# Patient Record
Sex: Female | Born: 1960 | Race: Black or African American | Hispanic: No | Marital: Single | Smoking: Never smoker
Health system: Southern US, Community
[De-identification: ages and names within clinical notes are randomized; demographics above are authoritative.]

## PROBLEM LIST (undated history)

## (undated) DIAGNOSIS — F329 Major depressive disorder, single episode, unspecified: Secondary | ICD-10-CM

## (undated) DIAGNOSIS — R87619 Unspecified abnormal cytological findings in specimens from cervix uteri: Secondary | ICD-10-CM

## (undated) DIAGNOSIS — F32A Depression, unspecified: Secondary | ICD-10-CM

## (undated) DIAGNOSIS — K219 Gastro-esophageal reflux disease without esophagitis: Secondary | ICD-10-CM

## (undated) DIAGNOSIS — IMO0002 Reserved for concepts with insufficient information to code with codable children: Secondary | ICD-10-CM

## (undated) DIAGNOSIS — O3410 Maternal care for benign tumor of corpus uteri, unspecified trimester: Secondary | ICD-10-CM

## (undated) DIAGNOSIS — I639 Cerebral infarction, unspecified: Secondary | ICD-10-CM

## (undated) DIAGNOSIS — D259 Leiomyoma of uterus, unspecified: Secondary | ICD-10-CM

## (undated) DIAGNOSIS — R51 Headache: Secondary | ICD-10-CM

## (undated) DIAGNOSIS — L659 Nonscarring hair loss, unspecified: Secondary | ICD-10-CM

## (undated) DIAGNOSIS — Q2112 Patent foramen ovale: Secondary | ICD-10-CM

## (undated) DIAGNOSIS — G43909 Migraine, unspecified, not intractable, without status migrainosus: Secondary | ICD-10-CM

## (undated) DIAGNOSIS — K589 Irritable bowel syndrome without diarrhea: Secondary | ICD-10-CM

## (undated) DIAGNOSIS — F419 Anxiety disorder, unspecified: Secondary | ICD-10-CM

## (undated) DIAGNOSIS — Q211 Atrial septal defect: Secondary | ICD-10-CM

## (undated) DIAGNOSIS — B029 Zoster without complications: Secondary | ICD-10-CM

## (undated) HISTORY — DX: Gastro-esophageal reflux disease without esophagitis: K21.9

## (undated) HISTORY — PX: TONSILLECTOMY: SUR1361

## (undated) HISTORY — DX: Migraine, unspecified, not intractable, without status migrainosus: G43.909

## (undated) HISTORY — DX: Patent foramen ovale: Q21.12

## (undated) HISTORY — DX: Irritable bowel syndrome, unspecified: K58.9

## (undated) HISTORY — DX: Depression, unspecified: F32.A

## (undated) HISTORY — DX: Unspecified abnormal cytological findings in specimens from cervix uteri: R87.619

## (undated) HISTORY — DX: Anxiety disorder, unspecified: F41.9

## (undated) HISTORY — DX: Maternal care for benign tumor of corpus uteri, unspecified trimester: O34.10

## (undated) HISTORY — DX: Leiomyoma of uterus, unspecified: D25.9

## (undated) HISTORY — DX: Reserved for concepts with insufficient information to code with codable children: IMO0002

## (undated) HISTORY — DX: Headache: R51

## (undated) HISTORY — DX: Cerebral infarction, unspecified: I63.9

## (undated) HISTORY — DX: Nonscarring hair loss, unspecified: L65.9

## (undated) HISTORY — DX: Major depressive disorder, single episode, unspecified: F32.9

## (undated) HISTORY — DX: Atrial septal defect: Q21.1

## (undated) HISTORY — DX: Zoster without complications: B02.9

---

## 1996-08-13 HISTORY — PX: OTHER SURGICAL HISTORY: SHX169

## 2001-02-03 ENCOUNTER — Other Ambulatory Visit: Admission: RE | Admit: 2001-02-03 | Discharge: 2001-02-03 | Payer: Self-pay | Admitting: *Deleted

## 2001-02-11 ENCOUNTER — Encounter: Payer: Self-pay | Admitting: Internal Medicine

## 2001-02-11 ENCOUNTER — Encounter: Admission: RE | Admit: 2001-02-11 | Discharge: 2001-02-11 | Payer: Self-pay | Admitting: Internal Medicine

## 2001-10-11 ENCOUNTER — Emergency Department (HOSPITAL_COMMUNITY): Admission: EM | Admit: 2001-10-11 | Discharge: 2001-10-11 | Payer: Self-pay | Admitting: Emergency Medicine

## 2002-02-17 ENCOUNTER — Other Ambulatory Visit: Admission: RE | Admit: 2002-02-17 | Discharge: 2002-02-17 | Payer: Self-pay | Admitting: Obstetrics and Gynecology

## 2002-07-28 ENCOUNTER — Other Ambulatory Visit: Admission: RE | Admit: 2002-07-28 | Discharge: 2002-07-28 | Payer: Self-pay | Admitting: Obstetrics and Gynecology

## 2003-03-22 ENCOUNTER — Other Ambulatory Visit: Admission: RE | Admit: 2003-03-22 | Discharge: 2003-03-22 | Payer: Self-pay | Admitting: Obstetrics and Gynecology

## 2003-04-01 ENCOUNTER — Encounter: Payer: Self-pay | Admitting: Gastroenterology

## 2003-04-01 ENCOUNTER — Encounter: Admission: RE | Admit: 2003-04-01 | Discharge: 2003-04-01 | Payer: Self-pay | Admitting: Gastroenterology

## 2003-08-19 ENCOUNTER — Encounter: Admission: RE | Admit: 2003-08-19 | Discharge: 2003-08-19 | Payer: Self-pay | Admitting: Gastroenterology

## 2004-04-20 ENCOUNTER — Other Ambulatory Visit: Admission: RE | Admit: 2004-04-20 | Discharge: 2004-04-20 | Payer: Self-pay | Admitting: Obstetrics and Gynecology

## 2004-04-28 ENCOUNTER — Ambulatory Visit (HOSPITAL_COMMUNITY): Admission: RE | Admit: 2004-04-28 | Discharge: 2004-04-28 | Payer: Self-pay | Admitting: Obstetrics and Gynecology

## 2004-05-18 ENCOUNTER — Ambulatory Visit (HOSPITAL_COMMUNITY): Admission: RE | Admit: 2004-05-18 | Discharge: 2004-05-18 | Payer: Self-pay | Admitting: Gastroenterology

## 2004-11-16 ENCOUNTER — Encounter: Admission: RE | Admit: 2004-11-16 | Discharge: 2004-11-16 | Payer: Self-pay | Admitting: *Deleted

## 2005-04-30 ENCOUNTER — Ambulatory Visit (HOSPITAL_COMMUNITY): Admission: RE | Admit: 2005-04-30 | Discharge: 2005-04-30 | Payer: Self-pay | Admitting: Obstetrics and Gynecology

## 2005-04-30 ENCOUNTER — Other Ambulatory Visit: Admission: RE | Admit: 2005-04-30 | Discharge: 2005-04-30 | Payer: Self-pay | Admitting: Obstetrics and Gynecology

## 2005-08-23 ENCOUNTER — Encounter: Admission: RE | Admit: 2005-08-23 | Discharge: 2005-08-23 | Payer: Self-pay | Admitting: Internal Medicine

## 2006-04-12 ENCOUNTER — Encounter (INDEPENDENT_AMBULATORY_CARE_PROVIDER_SITE_OTHER): Payer: Self-pay | Admitting: Cardiology

## 2006-04-12 ENCOUNTER — Ambulatory Visit (HOSPITAL_COMMUNITY): Admission: RE | Admit: 2006-04-12 | Discharge: 2006-04-12 | Payer: Self-pay | Admitting: Cardiology

## 2006-05-03 ENCOUNTER — Other Ambulatory Visit: Admission: RE | Admit: 2006-05-03 | Discharge: 2006-05-03 | Payer: Self-pay | Admitting: Obstetrics & Gynecology

## 2006-05-03 ENCOUNTER — Ambulatory Visit (HOSPITAL_COMMUNITY): Admission: RE | Admit: 2006-05-03 | Discharge: 2006-05-03 | Payer: Self-pay | Admitting: Obstetrics & Gynecology

## 2006-05-22 ENCOUNTER — Encounter: Admission: RE | Admit: 2006-05-22 | Discharge: 2006-05-22 | Payer: Self-pay | Admitting: Obstetrics & Gynecology

## 2006-05-31 ENCOUNTER — Inpatient Hospital Stay (HOSPITAL_COMMUNITY): Admission: EM | Admit: 2006-05-31 | Discharge: 2006-06-04 | Payer: Self-pay | Admitting: Pediatrics

## 2007-05-02 ENCOUNTER — Other Ambulatory Visit: Admission: RE | Admit: 2007-05-02 | Discharge: 2007-05-02 | Payer: Self-pay | Admitting: Obstetrics and Gynecology

## 2007-05-08 ENCOUNTER — Ambulatory Visit (HOSPITAL_COMMUNITY): Admission: RE | Admit: 2007-05-08 | Discharge: 2007-05-08 | Payer: Self-pay | Admitting: Obstetrics & Gynecology

## 2007-11-28 ENCOUNTER — Other Ambulatory Visit: Admission: RE | Admit: 2007-11-28 | Discharge: 2007-11-28 | Payer: Self-pay | Admitting: Obstetrics and Gynecology

## 2008-05-12 ENCOUNTER — Ambulatory Visit (HOSPITAL_COMMUNITY): Admission: RE | Admit: 2008-05-12 | Discharge: 2008-05-12 | Payer: Self-pay | Admitting: Obstetrics & Gynecology

## 2008-05-19 ENCOUNTER — Other Ambulatory Visit: Admission: RE | Admit: 2008-05-19 | Discharge: 2008-05-19 | Payer: Self-pay | Admitting: Obstetrics and Gynecology

## 2009-05-13 HISTORY — PX: OTHER SURGICAL HISTORY: SHX169

## 2009-05-25 ENCOUNTER — Ambulatory Visit (HOSPITAL_COMMUNITY): Admission: RE | Admit: 2009-05-25 | Discharge: 2009-05-25 | Payer: Self-pay | Admitting: Obstetrics & Gynecology

## 2009-06-01 ENCOUNTER — Ambulatory Visit (HOSPITAL_BASED_OUTPATIENT_CLINIC_OR_DEPARTMENT_OTHER): Admission: RE | Admit: 2009-06-01 | Discharge: 2009-06-01 | Payer: Self-pay | Admitting: Orthopedic Surgery

## 2010-06-23 ENCOUNTER — Ambulatory Visit (HOSPITAL_COMMUNITY): Admission: RE | Admit: 2010-06-23 | Discharge: 2010-06-23 | Payer: Self-pay | Admitting: Obstetrics & Gynecology

## 2010-09-02 ENCOUNTER — Encounter: Payer: Self-pay | Admitting: Obstetrics & Gynecology

## 2010-11-16 LAB — POCT HEMOGLOBIN-HEMACUE: Hemoglobin: 11.5 g/dL — ABNORMAL LOW (ref 12.0–15.0)
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
## 2011-04-13 ENCOUNTER — Other Ambulatory Visit: Payer: Self-pay | Admitting: Obstetrics & Gynecology

## 2011-04-13 DIAGNOSIS — Z1231 Encounter for screening mammogram for malignant neoplasm of breast: Secondary | ICD-10-CM

## 2011-06-25 ENCOUNTER — Ambulatory Visit (HOSPITAL_COMMUNITY)
Admission: RE | Admit: 2011-06-25 | Discharge: 2011-06-25 | Disposition: A | Discharge status: Home / Self Care | Payer: BC Managed Care – PPO | Source: Ambulatory Visit | Attending: Obstetrics & Gynecology | Admitting: Obstetrics & Gynecology

## 2011-06-25 ENCOUNTER — Ambulatory Visit (HOSPITAL_COMMUNITY): Payer: Self-pay

## 2011-06-25 DIAGNOSIS — Z1231 Encounter for screening mammogram for malignant neoplasm of breast: Secondary | ICD-10-CM | POA: Insufficient documentation

## 2011-11-11 ENCOUNTER — Ambulatory Visit: Payer: BC Managed Care – PPO | Admitting: Family Medicine

## 2011-11-11 ENCOUNTER — Ambulatory Visit: Payer: BC Managed Care – PPO

## 2011-11-11 VITALS — BP 127/80 | HR 105 | Temp 98.8°F | Resp 16 | Ht 63.25 in | Wt 148.4 lb

## 2011-11-11 DIAGNOSIS — R05 Cough: Secondary | ICD-10-CM

## 2011-11-11 DIAGNOSIS — J019 Acute sinusitis, unspecified: Secondary | ICD-10-CM

## 2011-11-11 DIAGNOSIS — J301 Allergic rhinitis due to pollen: Secondary | ICD-10-CM

## 2011-11-11 MED ORDER — HYDROCODONE-HOMATROPINE 5-1.5 MG/5ML PO SYRP: 5.0000 mL | ORAL_SOLUTION | Freq: Three times a day (TID) | ORAL | Status: AC | PRN

## 2011-11-11 MED ORDER — AMOXICILLIN 500 MG PO CAPS: 500.0000 mg | ORAL_CAPSULE | Freq: Three times a day (TID) | ORAL | Status: AC

## 2011-11-11 NOTE — Progress Notes (Signed)
  Subjective:    Patient ID: Vicki Wilson, female    DOB: 07/27/1961, 51 y.o.   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 This may have started with allergy symptoms. For the last 4 days she has been hoarse, with a sore throat, a nocturnal cough, and significant nasal congestion. She has no fever    Review of Systems     Objective:   Physical Exam  Vital signs are stable TMs clear  Nares purulent dischargeWith bilateral maxillary sinus tenderness to percussion Left conjunctiva is injected there is no eye discharge The throat is clear No a.c. Nodes Lungs clear       Assessment & Plan Problem #1 acute sinusitis :Problem #1 acute sinusitis  Problem #1 acute sinusitis Problem #2 allergic rhinitis Problem #3 cough Problem #4 conjunctivitis  Amoxicillin 1 g twice a day for 10 days Hycodan especially at bedtime Continue Zyrtec and Nasonex Followup if not well in 7-10 days

## 2011-11-15 ENCOUNTER — Encounter: Payer: Self-pay | Admitting: Family Medicine

## 2011-11-15 ENCOUNTER — Telehealth: Payer: Self-pay

## 2011-11-15 NOTE — Telephone Encounter (Signed)
Fine

## 2011-11-15 NOTE — Telephone Encounter (Signed)
Note written and patient notified to pickup.

## 2011-11-15 NOTE — Telephone Encounter (Signed)
Patient called and is still not feeling well. 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  Would like for Dr Merla Riches to extend work note for Wed 4/3 and Thurs 4/4? Best number [phone]

## 2011-11-19 ENCOUNTER — Ambulatory Visit (INDEPENDENT_AMBULATORY_CARE_PROVIDER_SITE_OTHER): Payer: BC Managed Care – PPO | Admitting: Sports Medicine

## 2011-11-19 VITALS — BP 124/82 | Ht 63.0 in | Wt 148.0 lb

## 2011-11-19 DIAGNOSIS — M79673 Pain in unspecified foot: Secondary | ICD-10-CM

## 2011-11-19 DIAGNOSIS — M79609 Pain in unspecified limb: Secondary | ICD-10-CM

## 2011-11-19 NOTE — Progress Notes (Signed)
  Subjective:    Patient ID: Vicki Wilson, female    DOB: Jun 19, 1961, 51 y.o.   MRN: [id number]  HPI 51 y/o female is here c/o left foot pain since January.  She walk/runs 2-3 miles 3-4 times per week on the treadmill but stopped about a month ago due to the pain.  The pain is a burning pain at the great MTP.  She also gets pain along the 1st metatarsal.  No swelling.   No injury.  The pain has improved somewhat since she stopped her treadmill work out.  She wears custom orthotics for flat feet.     Review of Systems     Objective:   Physical Exam  Loss of the longitudinal and transverse arch bilaterally Great toe MTP is prominent, left greater than right There is mild swelling of the left great toe MTP No erythema but it is tender to palpation Tender to palpation at the distal 1st metatarsal The remainder of the foot is non-tender No midfoot collapse Posterior tib function normal  Gait is neutral with sports insoles and scaphoid support  Ultrasound: No evidence of stress fracture.  Small fluid collection along the 1st metatarsal.  Spurring in the great toe MTP.      Assessment & Plan:

## 2011-11-21 DIAGNOSIS — M79673 Pain in unspecified foot: Secondary | ICD-10-CM | POA: Insufficient documentation

## 2011-11-21 NOTE — Assessment & Plan Note (Signed)
Her old orthotics are no longer effective. The memory plastic is cracked.  We have given her a temporary insole with scaphoid support.  She will follow up with [place] for new custom inserts.  While she does have some arthritic changes of the MTP joint the main cause of her pain is likely secondary to lack of support.  She can use OTC topical anti-inflammatories or NSAID's PRN joint pain.

## 2011-12-20 ENCOUNTER — Ambulatory Visit (INDEPENDENT_AMBULATORY_CARE_PROVIDER_SITE_OTHER): Payer: BC Managed Care – PPO | Admitting: Sports Medicine

## 2011-12-20 DIAGNOSIS — M79673 Pain in unspecified foot: Secondary | ICD-10-CM

## 2011-12-20 DIAGNOSIS — M79609 Pain in unspecified limb: Secondary | ICD-10-CM

## 2011-12-20 DIAGNOSIS — R269 Unspecified abnormalities of gait and mobility: Secondary | ICD-10-CM

## 2011-12-20 NOTE — Assessment & Plan Note (Signed)
Patient was fitted for a : standard, cushioned, semi-rigid orthotic. The orthotic was heated and afterward the patient stood on the orthotic blank positioned on the orthotic stand. The patient was positioned in subtalar neutral position and 10 degrees of ankle dorsiflexion in a weight bearing stance. After completion of molding, a stable base was applied to the orthotic blank. The blank was ground to a stable position for weight bearing. Size: 7 red cambray Base: blue EVA Posting: none Additional orthotic padding: none Time 35 mins  Walking gait was neutral in the orthotics. Jogging gait also remained neutral. She felt good comfort with these.  Recheck when necessary

## 2011-12-20 NOTE — Progress Notes (Signed)
Patient ID: Vicki Wilson, female   DOB: 02/04/1961, 51 y.o.   MRN: [id number]  Patient returns in followup of bilateral foot pain.  Left foot head pain primarily over the first MTP joint. On ultrasound there is some fluid in spurring in this joint. She has been orthotics for a long time but her old orthotics were cracked and did not support the forefoot. We put her in temporary sports and cells with scaphoid pad. These relieve some of her pains. She is coming back today for custom orthotics.  Objective Patient is not in any acute distress There is no swelling in either foot Left first MTP shows degenerative changes She has flattening of the longitudinal arches bilaterally with resting pronation Mild calcaneal valgus on the left Some loss of the transverse arch bilaterally a total and

## 2011-12-20 NOTE — Assessment & Plan Note (Signed)
Most of her left first ray and first MTP pain is resolved when walking in the orthotics  After one month if this is not giving her adequate relief we will add a first ray post

## 2012-04-17 ENCOUNTER — Other Ambulatory Visit: Payer: Self-pay | Admitting: Obstetrics & Gynecology

## 2012-04-17 DIAGNOSIS — Z1231 Encounter for screening mammogram for malignant neoplasm of breast: Secondary | ICD-10-CM

## 2012-06-27 ENCOUNTER — Ambulatory Visit (HOSPITAL_COMMUNITY)
Admission: RE | Admit: 2012-06-27 | Discharge: 2012-06-27 | Disposition: A | Discharge status: Home / Self Care | Payer: BC Managed Care – PPO | Source: Ambulatory Visit | Attending: Obstetrics & Gynecology | Admitting: Obstetrics & Gynecology

## 2012-06-27 DIAGNOSIS — Z1231 Encounter for screening mammogram for malignant neoplasm of breast: Secondary | ICD-10-CM | POA: Insufficient documentation

## 2012-06-27 LAB — HM PAP SMEAR: HM Pap smear: NEGATIVE

## 2012-11-06 ENCOUNTER — Telehealth: Payer: Self-pay | Admitting: Nurse Practitioner

## 2012-11-06 NOTE — Telephone Encounter (Signed)
LEFT MESSAGE ON HOME AND CELL # TO RETURN OUR CALL . SUE

## 2012-11-06 NOTE — Telephone Encounter (Signed)
Has Vicki Wilson come off Micronor?  If Vicki Wilson has come off then since no cycle in 90 days - lets do a Provera challenge 10 mg. X 10 days. Then cb with + / - response. PG

## 2012-11-06 NOTE — Telephone Encounter (Signed)
pt reports no cycle for 90 days/please advise/[place]

## 2012-11-06 NOTE — Telephone Encounter (Signed)
LEFT MESSAGE ON CELL # TO RETURN OUR CALL FOR MORE INFO. PLEASE ADVISE. SUE  . CHART IN YOUR OFFICE. SUE

## 2012-11-07 MED ORDER — MEDROXYPROGESTERONE ACETATE 10 MG PO TABS: 10.0000 mg | ORAL_TABLET | Freq: Every day | ORAL | Status: DC

## 2012-11-07 NOTE — Telephone Encounter (Signed)
[facility] notified via phone of provera order. sue

## 2012-11-07 NOTE — Telephone Encounter (Signed)
Patient states she did stop Micronor. Instructions given per Shirlyn Goltz, FNP, of medication to start. Rx sent by [facility] to [facility]. sue

## 2012-11-07 NOTE — Addendum Note (Signed)
Addended by: Elnora Morrison on: 11/07/2012 11:26 AM   Modules accepted: Orders

## 2012-12-05 ENCOUNTER — Other Ambulatory Visit: Payer: Self-pay | Admitting: Gastroenterology

## 2012-12-05 DIAGNOSIS — R109 Unspecified abdominal pain: Secondary | ICD-10-CM

## 2012-12-10 ENCOUNTER — Ambulatory Visit
Admission: RE | Admit: 2012-12-10 | Discharge: 2012-12-10 | Disposition: A | Discharge status: Home / Self Care | Payer: BC Managed Care – PPO | Source: Ambulatory Visit | Attending: Gastroenterology | Admitting: Gastroenterology

## 2012-12-10 DIAGNOSIS — R109 Unspecified abdominal pain: Secondary | ICD-10-CM

## 2012-12-16 ENCOUNTER — Telehealth: Payer: Self-pay | Admitting: Nurse Practitioner

## 2012-12-16 NOTE — Telephone Encounter (Signed)
Have patient to keep a menses record and again if no menses in 3-4 months to call back.

## 2012-12-16 NOTE — Telephone Encounter (Signed)
Pt states she began provera on 12/03/2012 an started her menses on 12/13/2012. Just FYI. Chart on your desk.

## 2012-12-16 NOTE — Telephone Encounter (Signed)
Started provera on 12/03/12 for 10 days.  Began menstrual cycle on 12/13/12

## 2012-12-17 NOTE — Telephone Encounter (Signed)
LVMTCB in regards to question.

## 2012-12-18 ENCOUNTER — Telehealth: Payer: Self-pay | Admitting: *Deleted

## 2012-12-18 NOTE — Telephone Encounter (Signed)
Pt is aware and agreeable to contact office if no menses in 3-4 months, per PG.

## 2013-01-02 ENCOUNTER — Encounter: Payer: Self-pay | Admitting: Neurology

## 2013-01-08 ENCOUNTER — Encounter: Payer: Self-pay | Admitting: Neurology

## 2013-01-08 ENCOUNTER — Other Ambulatory Visit: Payer: Self-pay | Admitting: *Deleted

## 2013-01-08 ENCOUNTER — Ambulatory Visit (INDEPENDENT_AMBULATORY_CARE_PROVIDER_SITE_OTHER): Payer: BC Managed Care – PPO | Admitting: Neurology

## 2013-01-08 ENCOUNTER — Telehealth: Payer: Self-pay | Admitting: Neurology

## 2013-01-08 VITALS — BP 111/79 | HR 66 | Temp 97.0°F | Ht 63.0 in | Wt 156.0 lb

## 2013-01-08 DIAGNOSIS — G43009 Migraine without aura, not intractable, without status migrainosus: Secondary | ICD-10-CM | POA: Insufficient documentation

## 2013-01-08 DIAGNOSIS — I6330 Cerebral infarction due to thrombosis of unspecified cerebral artery: Secondary | ICD-10-CM

## 2013-01-08 DIAGNOSIS — M791 Myalgia, unspecified site: Secondary | ICD-10-CM | POA: Insufficient documentation

## 2013-01-08 DIAGNOSIS — H53149 Visual discomfort, unspecified: Secondary | ICD-10-CM | POA: Insufficient documentation

## 2013-01-08 DIAGNOSIS — E2749 Other adrenocortical insufficiency: Secondary | ICD-10-CM

## 2013-01-08 DIAGNOSIS — R42 Dizziness and giddiness: Secondary | ICD-10-CM | POA: Insufficient documentation

## 2013-01-08 DIAGNOSIS — Q211 Atrial septal defect: Secondary | ICD-10-CM | POA: Insufficient documentation

## 2013-01-08 DIAGNOSIS — R748 Abnormal levels of other serum enzymes: Secondary | ICD-10-CM | POA: Insufficient documentation

## 2013-01-08 MED ORDER — PROPRANOLOL HCL 20 MG PO TABS: 10.0000 mg | ORAL_TABLET | Freq: Three times a day (TID) | ORAL | Status: DC

## 2013-01-08 NOTE — Progress Notes (Signed)
Subjective:    Patient ID: Vicki Wilson is a 52 y.o. female.  HPI  Interim history:   Vicki Wilson is a very pleasant 52 year old right-handed woman who presents for followup consultation of her headaches and dizziness. She is unaccompanied today. This is her first visit with me and she previously followed with Dr. Avie Echevaria and was last seen by him on 09/29/2012, at which time he he felt that her dizziness on moving her eyes was a central nervous system phenomenon, most likely migraine related. He repeated her brain MRI to rule out recurrent stroke. He started her on propranolol with slow buildup. He also suggested an EMG nerve conduction study. She had I nerve conduction and EMG on 10/03/2012 which showed normal findings and her nerve conduction velocities and unremarkable EMG. Her brain MRI without contrast on 10/02/2012 showed bilateral nonspecific subcortical and periventricular white matter hyperintensities. Dr. Sandria Manly called her back with the test results. He felt that there was no progression since April 2006. She was thought to have had a asymptomatic lacunar infarct in April 2006 and subsequently underwent extensive workup including MRA head and neck, Doppler of the carotids, transcranial bubble study, a TEE which did show a small PFO, but not large enough to be closed. She saw a cardiologist. She has developed muscle pain when taking triptans. She has had elevated CPK values he had started propranolol as a preventative, now at 10 mg bid. In the past, she had tried imipramine, topamax. Propranolol at 20 mg bid caused her diarrhea in the past. She is on Effexor XR 37.5 mg. She had been up to 75 mg for depression in the past and tolerated it. Her current regimen with propranolol and Effexor XR low doses, with mediocre success. She has tried [place] which did not help. She also took diclofenac pills, which did not work. She had SE with triptans.   I reviewed Dr. Imagene Gurney prior notes and the patient's  records and below is a summary of that review:  52 year old right-handed woman with past history of migraine headaches. Head CT in January 2007 showed right basal ganglion infarction which was also seen in April 2006 on a brain MRI. She had hypercoagulability studies, cholesterol studies, MRA of the neck and intracranial MRA which were normal in 2007. 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 This was very small. She has been on aspirin. She never had a clinical stroke. She has ongoing recurrent headaches. She takes Imitrex for abortive treatment at times. She has had elevated CK levels. She has had insomnia for many years and has tried [place], Lunesta, Ambien, Ambien CR, trazodone, temazepam..   Her Past Medical History Is Significant For: Past Medical History  Diagnosis Date  . Stroke   . Headache(784.0)   . Depression   . Anxiety     Her Past Surgical History Is Significant For: Past Surgical History  Procedure Laterality Date  . Tonsillectomy    . Bilateral inferior tubinate reduction  1998  . Epicondylitis Right 05/2009    with ulnar nerve decompression     Her Family History Is Significant For: Family History  Problem Relation Age of Onset  . High blood pressure Mother   . Hyperlipidemia Mother   . Gout Father   . High blood pressure Father   . Diabetes Mellitus I Sister   . Parkinson's disease Maternal Grandmother   . Diabetes Mellitus I Paternal Grandmother   . Diabetes Mellitus I  Paternal Grandfather     Her Social History Is Significant For: History   Social History  . Marital Status: Single    Spouse Name: N/A    Number of Children: N/A  . Years of Education: N/A   Social History Main Topics  . Smoking status: Never Smoker   . Smokeless tobacco: None  . Alcohol Use: None  . Drug Use: None  . Sexually Active: None   Other Topics Concern  . None   Social History Narrative  . None    Her Allergies  Are:  Allergies  Allergen Reactions  . Erythromycin   :   Her Current Medications Are:  Outpatient Encounter Prescriptions as of 01/08/2013  Medication Sig Dispense Refill  . b complex vitamins tablet Take 1 tablet by mouth daily.      Marland Kitchen CARAFATE 1 GM/10ML suspension       . carvedilol (COREG CR) 20 MG 24 hr capsule Take 20 mg by mouth 2 (two) times daily. 1/2      . cyclobenzaprine (FLEXERIL) 5 MG tablet Take 5 mg by mouth once. 1/2 tab in the am      . Magnesium-Calcium-Folic Acid (MAGNEBIND 400 PO) Take 400 mg by mouth at bedtime.      . Multiple Vitamins-Iron (MULTIVITAMINS WITH IRON) TABS Take 1 tablet by mouth daily.      Marland Kitchen omeprazole-sodium bicarbonate (ZEGERID) 40-1100 MG per capsule Take 1 capsule by mouth daily before breakfast.      . Probiotic Product (PROBIOTIC DAILY PO) Take by mouth.      Marland Kitchen ibuprofen (ADVIL,MOTRIN) 800 MG tablet Take 800 mg by mouth every 8 (eight) hours as needed.      Marland Kitchen omeprazole (PRILOSEC) 20 MG capsule Take 20 mg by mouth daily.      . propranolol (INDERAL) 20 MG tablet       . venlafaxine XR (EFFEXOR-XR) 37.5 MG 24 hr capsule       . zolpidem (AMBIEN) 10 MG tablet Take 10 mg by mouth at bedtime as needed.      . [DISCONTINUED] Ascorbic Acid (VITAMIN C) 1000 MG tablet Take 1,000 mg by mouth daily.      . [DISCONTINUED] aspirin 81 MG tablet Take 81 mg by mouth daily.      . [DISCONTINUED] citalopram (CELEXA) 10 MG tablet Take 10 mg by mouth daily.      . [DISCONTINUED] DONNATAL 16.2 MG/5ML ELIX       . [DISCONTINUED] imipramine (TOFRANIL) 10 MG tablet Take 10 mg by mouth at bedtime.      . [DISCONTINUED] medroxyPROGESTERone (PROVERA) 10 MG tablet Take 1 tablet (10 mg total) by mouth daily. X 10 days. Patient to call back in response to Tx  10 tablet  0  . [DISCONTINUED] omeprazole-sodium bicarbonate (ZEGERID) 40-1100 MG per capsule       . [DISCONTINUED] SUMAtriptan (IMITREX) 6 MG/0.5ML SOLN injection Inject 6 mg into the skin every 2 (two) hours as  needed. F       No facility-administered encounter medications on file as of 01/08/2013.   Review of Systems  Constitutional: Positive for unexpected weight change.  HENT:       Dry Mouth  Endocrine: Positive for heat intolerance.  Neurological: Positive for headaches.  Psychiatric/Behavioral: Positive for sleep disturbance.    Objective:  Neurologic Exam  Physical Exam Physical Examination:   Filed Vitals:   01/08/13 1114  BP: 111/79  Pulse: 66  Temp: 97 F (36.1 C)  General Examination: The patient is a very pleasant 52 y.o. female in no acute distress. She appears well-developed and well-nourished and well groomed.   HEENT: Normocephalic, atraumatic, pupils are equal, round and reactive to light and accommodation. Funduscopic exam is normal with sharp disc margins noted. Extraocular tracking is good without limitation to gaze excursion or nystagmus noted. Normal smooth pursuit is noted. Hearing is grossly intact. Tympanic membranes are clear bilaterally. Face is symmetric with normal facial animation and normal facial sensation. Speech is clear with no dysarthria noted. There is no hypophonia. There is no lip, neck/head, jaw or voice tremor. Neck is supple with full range of passive and active motion. There are no carotid bruits on auscultation. Oropharynx exam reveals: mild mouth dryness, good dental hygiene and no significant airway crowding. Mallampati is class I. Tongue protrudes centrally and palate elevates symmetrically.    Chest: Clear to auscultation without wheezing, rhonchi or crackles noted.  Heart: S1+S2+0, regular and normal without murmurs, rubs or gallops noted.   Abdomen: Soft, non-tender and non-distended with normal bowel sounds appreciated on auscultation.  Extremities: There is no pitting edema in the distal lower extremities bilaterally.   Skin: Warm and dry without trophic changes noted. There are no varicose veins.  Musculoskeletal: exam reveals no  obvious joint deformities, tenderness or joint swelling or erythema.   Neurologically:  Mental status: The patient is awake, alert and oriented in all 4 spheres. Her memory, attention, language and knowledge are appropriate. There is no aphasia, agnosia, apraxia or anomia. Speech is clear with normal prosody and enunciation. Thought process is linear. Mood is congruent and affect is normal.  Cranial nerves are as described above under HEENT exam. In addition, shoulder shrug is normal with equal shoulder height noted. Motor exam: Normal bulk, strength and tone is noted. There is no drift, tremor or rebound. Romberg is negative. Reflexes are 2+ throughout. Toes are downgoing bilaterally. Fine motor skills are intact with normal finger taps, normal hand movements, normal rapid alternating patting, normal foot taps and normal foot agility.  Cerebellar testing shows no dysmetria or intention tremor on finger to nose testing. Heel to shin is unremarkable bilaterally. There is no truncal or gait ataxia.  Sensory exam is intact to light touch, pinprick, vibration, temperature sense and proprioception in the upper and lower extremities.  Gait, station and balance are unremarkable. No veering to one side is noted. No leaning to one side is noted. Posture is age-appropriate and stance is narrow based. No problems turning are noted. She turns en bloc. Tandem walk is unremarkable. Intact toe and heel stance is noted.               Assessment and Plan:   Assessment and Plan:  In summary, DAKIYA PUOPOLO is a very pleasant 52 y.o.-year old female previously followed by Dr. Sandria Manly with a history of migraine without aura. She has been tried on multiple different abortive medications and has tried preventative medications in the form of imipramine and Topamax as well as Effexor or in the past and is currently on low-dose Effexor and low-dose propranolol. I would like to suggest a cautious increase in her propranolol to 10  mg 3 times a day. On 40 mg daily she had diarrhea in the past. We will have to see how it goes. I had a long chat with the patient regarding her symptoms. Her exam is nonfocal and I reassured her in that regard. She is advised to continue using Flexeril  and over-the-counter anti-inflammatory as needed for abortive treatment. She had side effects with several triptans in the past. She is encouraged to followup with her primary care physician for her chronic insomnia. I did give her a prescription for propranolol 10 mg 3 times a day with refills. I suggested a six-month followup with one of my colleagues for her chronic migraines. I answered all her questions today and she is encouraged to call with any interim problems. She was in agreement.

## 2013-01-08 NOTE — Patient Instructions (Addendum)
Please remember, common headache triggers are: sleep deprivation, dehydration, overheating, stress, hypoglycemia or skipping meals, excessive pain medications or excessive alcohol use. Some people have food triggers such as aged cheese, orange juice or chocolate, especially dark chocolate. Try to avoid these headache triggers as much possible. It may be helpful to keep a headache diary to figure out what makes your headaches worse or brings them on. Some people report headache onset after exercise but studies have shown that regular exercise may actually prevent headaches from coming on. If you have exercise-induced headaches, please make sure that you drink plenty of fluid before and after exercising and that you don't over do it.  Follow up in 6 mo, Andrey Campanile, our nurse will call you with your appt time and physician.

## 2013-01-09 NOTE — Telephone Encounter (Signed)
Voice message left requesting a call-back. 

## 2013-02-02 ENCOUNTER — Ambulatory Visit (INDEPENDENT_AMBULATORY_CARE_PROVIDER_SITE_OTHER): Payer: BC Managed Care – PPO | Admitting: Family Medicine

## 2013-02-02 VITALS — BP 109/76 | Ht 63.0 in | Wt 152.0 lb

## 2013-02-02 DIAGNOSIS — M79672 Pain in left foot: Secondary | ICD-10-CM

## 2013-02-02 DIAGNOSIS — M79609 Pain in unspecified limb: Secondary | ICD-10-CM

## 2013-02-03 NOTE — Progress Notes (Signed)
  Subjective:    Patient ID: Vicki Wilson, female    DOB: 1960/09/28, 52 y.o.   MRN: [id number]  HPI Complaining of left foot pain. Has had this before. Over the last couple of weeks it has gotten worse. Top of left foot right over the first ray sometimes swells. It is painful at the end of the day. She has been wearing her orthotics regularly but does not seem to help much.   Review of Systems No numbness in her foot. Has not noticed any ankle swelling, no erythema or rash on either foot.    Objective:   Physical Exam  Vital signs are reviewed GENERAL: Well-developed female no acute distress FEET: Bilaterally she has significant pes planus. She has medial foot collapse left greater than right. Beginning bunion at the first MTP on both sides. Area of tenderness on the left foot is over the distal first ray in the MTP joint. NEURO: Intact sensation and proprioception in the foot.      Assessment & Plan:  #1. Foot pain mostly over the left first ray. I think this is secondary to mechanical issues given her extreme pes planus. 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 I recommend continue orthotics but I place a small can scaphoid pad as extra-support in the left shoe. We'll see how this improves things. She's not having some significant relief in a couple weeks or come back. I might consider her for a steroid injection.

## 2013-02-16 ENCOUNTER — Telehealth: Payer: Self-pay | Admitting: Nurse Practitioner

## 2013-02-16 ENCOUNTER — Encounter: Payer: Self-pay | Admitting: *Deleted

## 2013-02-16 NOTE — Telephone Encounter (Signed)
See note below. Patient states having a lot of pelvic pressure and discomfort. Appointment made with [name] for tomorrow @ 8 :30am. Vicki Wilson

## 2013-02-16 NOTE — Telephone Encounter (Signed)
Left message on CB# of need to return call for appointment with [name], FNP.

## 2013-02-16 NOTE — Telephone Encounter (Signed)
PATIENT HAS NOT HAD MENSES SINCE 11/27/12, PLEASE CALL. PATIENT IS HAVING PELVIC PRESSURE AND PAIN.

## 2013-02-17 ENCOUNTER — Ambulatory Visit (INDEPENDENT_AMBULATORY_CARE_PROVIDER_SITE_OTHER): Payer: BC Managed Care – PPO | Admitting: Nurse Practitioner

## 2013-02-17 ENCOUNTER — Encounter: Payer: Self-pay | Admitting: Nurse Practitioner

## 2013-02-17 VITALS — BP 102/60 | HR 68 | Temp 98.1°F | Resp 12 | Ht 63.5 in | Wt 153.0 lb

## 2013-02-17 DIAGNOSIS — M25559 Pain in unspecified hip: Secondary | ICD-10-CM

## 2013-02-17 DIAGNOSIS — N9489 Other specified conditions associated with female genital organs and menstrual cycle: Secondary | ICD-10-CM

## 2013-02-17 DIAGNOSIS — N912 Amenorrhea, unspecified: Secondary | ICD-10-CM

## 2013-02-17 LAB — POCT URINALYSIS DIPSTICK
Leukocytes, UA: NEGATIVE
Urobilinogen, UA: NEGATIVE
pH, UA: 5.5

## 2013-02-17 LAB — CBC
Hemoglobin: 12.5 g/dL (ref 12.0–15.0)
MCH: 30 pg (ref 26.0–34.0)
MCHC: 33.9 g/dL (ref 30.0–36.0)
Platelets: 264 10*3/uL (ref 150–400)
RDW: 13.1 % (ref 11.5–15.5)

## 2013-02-17 MED ORDER — MEDROXYPROGESTERONE ACETATE 10 MG PO TABS: 10.0000 mg | ORAL_TABLET | Freq: Every day | ORAL | Status: DC

## 2013-02-17 NOTE — Progress Notes (Signed)
Subjective:     Patient ID: Vicki Wilson, female   DOB: 04/12/1961, 52 y.o.   MRN: [id number]  Pelvic Pain The patient's primary symptoms include a missed menses and pelvic pain. Primary symptoms comment: New onset 6/27 weekend with LLQ pain.  Then again recurrent on 7/3 with pressure , pulling sensation, and uncomfortabe to walk. Some relief with urination, maybe some relief with BM. Pain is intermittent and  is described as 'menstrual' related. This is a new problem. The current episode started in the past 7 days. The problem occurs constantly. The problem has been gradually worsening. The pain is moderate (pain scale 8/10). The problem affects both (initally left then bilateral pain) sides. She is not pregnant. Associated symptoms include frequency, headaches, nausea and urgency. Pertinent negatives include no abdominal pain, anorexia, back pain, chills, constipation, diarrhea, discolored urine, dysuria, fever, flank pain, hematuria, joint pain, joint swelling, painful intercourse, rash, sore throat or vomiting. Associated symptoms comments: Recent GI symptoms of epigastric and EDG showed gastric inflammation with a stomache polyp and has continued nausea.. Nothing aggravates the symptoms. She has tried NSAIDs for the symptoms. The treatment provided mild relief. She is not sexually active (not SA since 2011). She uses nothing (Off micronor since 08/2012, took Provera challenge in April and had withdrawl for 5 days with first 2 days very heavy) for contraception. Her menstrual history has been irregular. Her past medical history is significant for menorrhagia. There is no history of endometriosis. (History of ufterine fibroids)      Review of Systems  Constitutional: Negative for fever and chills.  HENT: Negative for sore throat.   Gastrointestinal: Positive for nausea. Negative for vomiting, abdominal pain, diarrhea, constipation and anorexia.  Genitourinary: Positive for urgency, frequency, pelvic  pain, menorrhagia and missed menses. Negative for dysuria, hematuria and flank pain.       Denies urinary symptoms.  Musculoskeletal: Negative for back pain and joint pain.  Skin: Negative for rash.  Neurological: Positive for headaches.       Headaches are common, they were aggravated with POP. She has had a lacunar infarct on right side secondary to OCP.  Psychiatric/Behavioral: Negative.        Objective:   Physical Exam  Constitutional: She is oriented to person, place, and time. She appears well-developed and well-nourished. She appears distressed.  Has forward bent to relieve pelvic pressure.  Cardiovascular: Normal rate and normal heart sounds.   Pulmonary/Chest: Effort normal and breath sounds normal.  Abdominal: Soft. Bowel sounds are normal. She exhibits no distension and no mass. There is tenderness. There is no rebound and no guarding.  Genitourinary:  No vaginal discharge, some pain on bimanual exam with enlarged uterus at 10-12 week secondary to fibroids.  Musculoskeletal: Normal range of motion.  Neurological: She is alert and oriented to person, place, and time.  Psychiatric: She has a normal mood and affect. Her behavior is normal. Judgment and thought content normal.       Assessment:     Pelvic Pressure / pain that seems to be 'menstrual' related Amenorrhea consistent with perimenopause LMP 12/03/12 Not sexually active since 2011 History of uterine fibroids - declined intervention in the past but now may consider surgical intervention    Plan:     Will get stat CBC and follow Will check urine culture to be complete Last PUS 10/17/11 and may need to repeat if continued pain Will start on Provera 10 mg daily for 10 days and expect withdrawal bleed.  Depending on outcome and pain / pressure may need to have further studies.  She will call back in 3 weeks for a progress report     Patient was informed of normal CBC.  Will continue as planned.

## 2013-02-17 NOTE — Patient Instructions (Addendum)
Provera challenge for 10 days then expect withdrawal bleed within a week or so. After cycle pain and symptoms should be resolved, if not then call back for a PUS to compare to last year uterine fibroids.    Stat CBC we will call you with results.  Urine culture and will call you with results.

## 2013-02-18 LAB — URINE CULTURE
Colony Count: NO GROWTH
Organism ID, Bacteria: NO GROWTH

## 2013-02-19 ENCOUNTER — Telehealth: Payer: Self-pay | Admitting: *Deleted

## 2013-02-19 NOTE — Telephone Encounter (Signed)
Pt is aware of negative urine culture results and has been advised to call our office if her symptoms worsen.

## 2013-02-19 NOTE — Telephone Encounter (Signed)
Message copied by Osie Bond on Thu Feb 19, 2013  3:58 PM ------      Message from: Ria Comment R      Created: Thu Feb 19, 2013  8:35 AM       Let patient know that urine C&S is negative and get a progress report, however I suspect she will be a lot better until after she has a withdrawal bleed. ------

## 2013-02-21 NOTE — Progress Notes (Signed)
Encounter reviewed by Dr. Brook Silva.  

## 2013-03-12 ENCOUNTER — Other Ambulatory Visit: Payer: Self-pay

## 2013-03-12 MED ORDER — VENLAFAXINE HCL ER 37.5 MG PO CP24: ORAL_CAPSULE | ORAL | Status: DC

## 2013-03-12 NOTE — Telephone Encounter (Signed)
Former [facility] patient assigned initially to Dr Frances Furbish, (notes say she is still taking low dose of Effexor) and then reassigned to Dr Hosie Poisson.  Patient has an appt with Dr Hosie Poisson in November.

## 2013-03-18 ENCOUNTER — Ambulatory Visit
Admission: RE | Admit: 2013-03-18 | Discharge: 2013-03-18 | Disposition: A | Discharge status: Home / Self Care | Payer: BC Managed Care – PPO | Source: Ambulatory Visit | Attending: Sports Medicine | Admitting: Sports Medicine

## 2013-03-18 ENCOUNTER — Ambulatory Visit (INDEPENDENT_AMBULATORY_CARE_PROVIDER_SITE_OTHER): Payer: BC Managed Care – PPO | Admitting: Sports Medicine

## 2013-03-18 VITALS — BP 108/70 | Ht 63.0 in | Wt 149.0 lb

## 2013-03-18 DIAGNOSIS — M79672 Pain in left foot: Secondary | ICD-10-CM

## 2013-03-18 DIAGNOSIS — M79609 Pain in unspecified limb: Secondary | ICD-10-CM

## 2013-03-18 NOTE — Progress Notes (Signed)
  Subjective:    Patient ID: Vicki Wilson, female    DOB: [birth year], 52 y.o.   MRN: [id number]  HPI Patient comes in today for followup on left great toe pain. She has seen both Dr. Darrick Penna and Dr. Jennette Kettle in the past. Please refer to previous office notes for further history. Overall, she feels like her pain is 50% improved. Dr. Jennette Kettle at it a small scaphoid pad to her custom orthotic 6 weeks ago and that has helped. However, she is still getting intermittent pain along the first metatarsal. Some discomfort at the MTP joint as well but most of her pain is localized along the metatarsal itself. Although she did initially experience some swelling she has not had any reoccurrence recently. She has been trying to become more fit by doing some low impact Zumba. She would also like to try some jogging.    Review of Systems     Objective:   Physical Exam Well-developed, well-nourished. No acute distress.  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 Mild bunion deformity. Mild tenderness to palpation at the first metatarsal head. No joint effusion. Some tenderness to palpation along the first metatarsal. No tenderness along the extensor hallucis longus tendon and no pain with resisted great toe extension. Mild pes planus with standing and pronation with walking. Neurovascularly intact distally. Walking without a limp.       Assessment & Plan:  1. Improving first metatarsal pain, left foot  I'm going to try adding a simple arch strap. Evaluation of her gait with the orthotic in place shows good correction of her pronation so I don't think we need to build up the arch any more. I think she can continue to increase activity as tolerated. I would like to check a plain x-ray of her foot and have her followup in 3 weeks. Call with questions or concerns in the interim.

## 2013-03-30 ENCOUNTER — Telehealth: Payer: Self-pay | Admitting: Neurology

## 2013-04-17 ENCOUNTER — Telehealth: Payer: Self-pay | Admitting: Neurology

## 2013-04-17 NOTE — Telephone Encounter (Signed)
Can you put her in a follow up slot. Thanks.

## 2013-04-21 NOTE — Telephone Encounter (Signed)
I called and made appt for pt 05-06-13 at 1345 for headaches.   Trying to get seen prior to 05-11-13.  Placed on waitlist.   Cancelled the 05-07-13 appt since this did not work for pt.

## 2013-04-22 ENCOUNTER — Telehealth: Payer: Self-pay | Admitting: *Deleted

## 2013-04-22 NOTE — Telephone Encounter (Signed)
See other note

## 2013-04-22 NOTE — Telephone Encounter (Signed)
Contacted pt and made appt for 05-11-13 at 1430.  She confirmed.

## 2013-04-27 ENCOUNTER — Other Ambulatory Visit: Payer: Self-pay | Admitting: Obstetrics & Gynecology

## 2013-04-27 DIAGNOSIS — Z1231 Encounter for screening mammogram for malignant neoplasm of breast: Secondary | ICD-10-CM

## 2013-05-06 ENCOUNTER — Ambulatory Visit: Payer: Self-pay | Admitting: Neurology

## 2013-05-07 ENCOUNTER — Ambulatory Visit: Payer: Self-pay | Admitting: Neurology

## 2013-05-11 ENCOUNTER — Ambulatory Visit (INDEPENDENT_AMBULATORY_CARE_PROVIDER_SITE_OTHER): Payer: BC Managed Care – PPO | Admitting: Neurology

## 2013-05-11 ENCOUNTER — Ambulatory Visit (INDEPENDENT_AMBULATORY_CARE_PROVIDER_SITE_OTHER): Payer: BC Managed Care – PPO | Admitting: Sports Medicine

## 2013-05-11 ENCOUNTER — Encounter: Payer: Self-pay | Admitting: Sports Medicine

## 2013-05-11 ENCOUNTER — Encounter: Payer: Self-pay | Admitting: Neurology

## 2013-05-11 VITALS — BP 117/70 | HR 71 | Ht 64.0 in | Wt 149.0 lb

## 2013-05-11 VITALS — BP 134/89 | HR 70 | Ht 63.0 in | Wt 149.0 lb

## 2013-05-11 DIAGNOSIS — G43009 Migraine without aura, not intractable, without status migrainosus: Secondary | ICD-10-CM

## 2013-05-11 DIAGNOSIS — M79609 Pain in unspecified limb: Secondary | ICD-10-CM

## 2013-05-11 DIAGNOSIS — M79672 Pain in left foot: Secondary | ICD-10-CM

## 2013-05-11 DIAGNOSIS — G4700 Insomnia, unspecified: Secondary | ICD-10-CM

## 2013-05-11 MED ORDER — MIRTAZAPINE 7.5 MG PO TABS: 7.5000 mg | ORAL_TABLET | Freq: Every day | ORAL | Status: DC

## 2013-05-11 NOTE — Progress Notes (Signed)
Provider:  Dr Hosie Poisson Referring Provider: Johny Blamer, MD Primary Care Physician:  Johny Blamer, MD  CC:  headaches  HPI:  Vicki Wilson is a 52 y.o. female here as a follow up with last visit on 12/2012. At that time she was started on propranolol 10mg  TID for her headaches. Unable to do the 3x a day so has been taking BID. She is poorly tolerated and has not received much benefit. For greater than 12 months she's been having more than 15 headaches per month. Typically occurring 4-5 times a week. They last 6 hours to all day.in addition to the propranolol she has been taking  800mg  ibuprofen and 2.5mg  flexeril when headache peaks. At its worst is an 8-9/10. Is there is occurring at least 8 times per month. Lately nothing has been helping. Continues to be a R sided pounding, some nausea, +photo and phonophobia. No vision changes, no aura. No focal motor or sensory changes.  Has been on multiple prophylactic agents in the past, including Topamax, and hymen, propranolol, unsure what she has been on. Has not done any benefit from these medications or has not been able to tolerate some of them.  Continues  to have difficulty with insomnia. Has tried Ambien, Ambien CR, Lunesta, Sonata, Pamelor, Trazodone, Elavil. Currently taking Ambien CR and Melatonin 10mg  nightly. Has not gotten much benefit from this regimen.   Per last visit with Dr Frances Furbish from 12/2012: In summary, Vicki Wilson is a very pleasant 52 y.o.-year old female previously followed by Dr. Sandria Manly with a history of migraine without aura. She has been tried on multiple different abortive medications and has tried preventative medications in the form of imipramine and Topamax as well as Effexor or in the past and is currently on low-dose Effexor and low-dose propranolol. I would like to suggest a cautious increase in her propranolol to 10 mg 3 times a day. On 40 mg daily she had diarrhea in the past. We will have to see how it goes. I had a  long chat with the patient regarding her symptoms. Her exam is nonfocal and I reassured her in that regard. She is advised to continue using Flexeril and over-the-counter anti-inflammatory as needed for abortive treatment. She had side effects with several triptans in the past. She is encouraged to followup with her primary care physician for her chronic insomnia. I did give her a prescription for propranolol 10 mg 3 times a day with refills. I suggested a six-month followup with one of my colleagues for her chronic migraines. I answered all her questions today and she is encouraged to call with any interim problems. She was in agreement.  Concerns/Questions:Review of Systems: Out of a complete 14 system review, the patient complains of only the following symptoms, and all other reviewed systems are negative. Other for fatigue diarrhea headache passing out insomnia not enough sleep  History   Social History  . Marital Status: Single    Spouse Name: N/A    Number of Children: N/A  . Years of Education: N/A   Occupational History  . Not on file.   Social History Main Topics  . Smoking status: Never Smoker   . Smokeless tobacco: Not on file  . Alcohol Use: Not on file  . Drug Use: Not on file  . Sexual Activity: Not on file   Other Topics Concern  . Not on file   Social History Narrative  . No narrative on file    Family  History  Problem Relation Age of Onset  . High blood pressure Mother   . Hyperlipidemia Mother   . Gout Father   . High blood pressure Father   . Diabetes Mellitus I Sister   . Parkinson's disease Maternal Grandmother   . Diabetes Mellitus I Paternal Grandmother   . Diabetes Mellitus I Paternal Grandfather     Past Medical History  Diagnosis Date  . Stroke   . Headache(784.0)   . Depression   . Anxiety   . Migraines   . Uterine fibroids affecting pregnancy   . IBS (irritable bowel syndrome)   . GERD (gastroesophageal reflux disease)   . PFO (patent  foramen ovale)   . Abnormal Pap smear     ASCUS-08/1998  LGSIL/HPV- 01/1999    Past Surgical History  Procedure Laterality Date  . Tonsillectomy    . Bilateral inferior tubinate reduction  1998  . 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 Amino Acids (AMINO ACID PO) Take by mouth.      . Biotin 1000 MCG tablet Take 1,000 mcg by mouth 3 (three) times daily.      Marland Kitchen CARAFATE 1 GM/10ML suspension       . cyclobenzaprine (FLEXERIL) 5 MG tablet Take 5 mg by mouth as needed.       . fluticasone (FLONASE) 50 MCG/ACT nasal spray Place 2 sprays into the nose daily as needed.       Marland Kitchen ibuprofen (ADVIL,MOTRIN) 800 MG tablet Take 800 mg by mouth every 8 (eight) hours as needed.      . Melatonin 10 MG TABS Take 10 tablets by mouth daily.      . Multiple Vitamins-Iron (MULTIVITAMINS WITH IRON) TABS Take 1 tablet by mouth daily.      . naproxen sodium (ANAPROX) 220 MG tablet Take 220 mg by mouth 2 (two) times daily with a meal.      . omeprazole-sodium bicarbonate (ZEGERID) 40-1100 MG per capsule Take 1 capsule by mouth daily before breakfast.      . propranolol (INDERAL) 20 MG tablet Take 20 mg by mouth 2 (two) times daily.      Marland Kitchen venlafaxine XR (EFFEXOR-XR) 37.5 MG 24 hr capsule Take 37.5 mg by mouth daily.      . Vitamin D, Ergocalciferol, (DRISDOL) 50000 UNITS CAPS Take 50,000 Units by mouth every 7 (seven) days.      Marland Kitchen zolpidem (AMBIEN CR) 12.5 MG CR tablet Take 12.5 mg by mouth at bedtime as needed for sleep.       No current facility-administered medications for this visit.    Allergies as of 05/11/2013 - Review Complete 05/11/2013  Allergen Reaction Noted  . Erythromycin  11/11/2011    Vitals: BP 117/70  Pulse 71  Ht 5\' 4"  (1.626 m)  Wt 149 lb (67.586 kg)  BMI 25.56 kg/m2 Last Weight:  Wt Readings from Last 1 Encounters:  05/11/13 149 lb (67.586 kg)   Last Height:   Ht Readings from Last  1 Encounters:  05/11/13 5\' 4"  (1.626 m)     Physical exam: Exam: Gen: NAD, conversant Eyes: anicteric sclerae, moist conjunctivae HENT: Atraumatic, oropharynx clear Lungs: CTA, no wheezing, rales, rhonic                          CV: RRR, no MRG Abdomen: Soft, non-tender;  Extremities: No peripheral  edema  Skin: Normal temperature, no rash,  Psych: Appropriate affect, pleasant  Neuro: MS: AA&Ox3, appropriately interactive, normal affect   Speech: fluent w/o paraphasic error  Memory: good recent and remote recall  CN: PERRL, EOMI no nystagmus, no ptosis, unable to visualize optic disc bilat, VFF to FC bilat, sensation intact to LT V1-V3 bilat, face symmetric, no weakness, hearing grossly intact, palate elevates symmetrically, shoulder shrug 5/5 bilat,  tongue protrudes midline, no fasiculations noted.  Motor: normal bulk and tone Strength: 5/5  In all extremities  Coord: rapid alternating and point-to-point (FNF, HTS) movements intact.  Reflexes: symmetrical, bilat downgoing toes  Sens: LT intact in all extremities  Gait: posture, stance, stride and arm-swing normal. .   Assessment:  After physical and neurologic examination, review of laboratory studies, imaging, neurophysiology testing and pre-existing records, assessment will be reviewed on the problem list.  Plan:  Treatment plan and additional workup will be reviewed under Problem List.  Ms Sakuma is a pleasant 52 year old woman presenting for followup evaluation of chronic migraine headache. At last visit with Dr. Frances Furbish she was started on propranolol 10 mg 3 times a day. She is only currently taking it twice a day do to tolerance. She does not notice any benefit with this medication. He has been on multiple other prophylactic agents, including Topamax, Imipramine others she is unsure of. Headaches appear to be a migraine without aura. We discussed different therapeutic options, we will try Botox therapy at this time.  Patient also suffered from insomnia, which I suspect may be exacerbating her headaches. She has been on multiple medications for this. We'll try low-dose Remeron.  1)Migraine without aura 2)Insomnia  -will try botulinum toxin injections for migraine relief.  -continue on BB and Effexor for now. Will likely taper off once she returns for injections -will try low dose Remeron for insomnia -follow up once insurance approval granted for Botox injections

## 2013-05-11 NOTE — Progress Notes (Signed)
  Subjective:    Patient ID: Vicki Wilson, female    DOB: [birth year], 52 y.o.   MRN: [id number]  HPI Patient comes in today for followup on left great toe pain. Still experiencing some discomfort along the plantar aspect of the first metatarsal. Recent x-rays showed some mild degenerative changes at the first MTP joint but was otherwise unremarkable. She brought in a couple of pairs of shoes today to see if we could fit those with some orthotics. The orthotics that were constructed previously are comfortable but they fit only in her tennis shoes.    Review of Systems     Objective:   Physical Exam Well-developed, well-nourished. No acute distress  Left foot: There is tenderness to palpation at the plantar aspect of the foot along the first metatarsal head. No soft tissue swelling. Still no tenderness with resisted extensor hallucis longus were flexor hallucis longus. Walking without a limp.      Assessment & Plan:  First metatarsal metatarsalgia, left foot  To her custom orthotics we are going to add some blue foam for additional cushioning for the first metatarsal head. She has some green sports insoles which were given to her previously and we've asked her to return to the office at her convenience so that we can fit these in some of her other shoes. We will want to try to pad the first metatarsal area of these green sports insoles as well. She is instructed to contact Amy Daphine Deutscher directly when she is ready to have this done.

## 2013-05-11 NOTE — Patient Instructions (Addendum)
Overall you are doing fairly well but I do want to suggest a few things today:   We are going to start you on a medication called Remeron (Mirtazapine). Please take 1/2 tablet nightly, around 1hr prior to going to bed  We are going to try Botox injections for your migraine headaches. We will contact you once we get insurance approval for this.   Please call us with any interim questions, concerns, problems, updates or refill requests.   My clinical assistant and will answer any of your questions and relay your messages to me and also relay most of my messages to you.   Our phone number is [phone]. We also have an after hours call service for urgent matters and there is a physician on-call for urgent questions. For any emergencies you know to call 911 or go to the nearest emergency room

## 2013-05-22 ENCOUNTER — Telehealth: Payer: Self-pay | Admitting: Nurse Practitioner

## 2013-05-22 NOTE — Telephone Encounter (Signed)
Pt wanting to let nurse know that she has not had a period since July 10. She says patty usually send prevara to pharmacy. [facility] [address] at [phone].

## 2013-05-22 NOTE — Telephone Encounter (Signed)
Vicki Wilson, Patient last seen in office 02/17/13 and was started on Provera Challenge then. What do you advise for patient?   Message left to return call to [place] at [phone].

## 2013-05-25 ENCOUNTER — Other Ambulatory Visit: Payer: Self-pay | Admitting: Nurse Practitioner

## 2013-05-25 MED ORDER — MEDROXYPROGESTERONE ACETATE 10 MG PO TABS: 10.0000 mg | ORAL_TABLET | Freq: Every day | ORAL | Status: DC

## 2013-05-25 NOTE — Telephone Encounter (Signed)
Spoke with pt to advise provera Rx was sent to [facility]. Pt to take for 10 days and call back with result. Pt agreeable.

## 2013-05-25 NOTE — Telephone Encounter (Signed)
OK for Provera challenge 10 mg X 10 days and to call back with +/- response.order put in Epic.

## 2013-06-01 LAB — HM COLONOSCOPY

## 2013-06-03 ENCOUNTER — Telehealth: Payer: Self-pay | Admitting: Neurology

## 2013-06-10 ENCOUNTER — Ambulatory Visit (INDEPENDENT_AMBULATORY_CARE_PROVIDER_SITE_OTHER): Payer: BC Managed Care – PPO | Admitting: Neurology

## 2013-06-10 ENCOUNTER — Other Ambulatory Visit: Payer: Self-pay | Admitting: Neurology

## 2013-06-10 ENCOUNTER — Encounter: Payer: Self-pay | Admitting: Neurology

## 2013-06-10 VITALS — BP 114/72 | HR 67 | Ht 64.0 in | Wt 150.0 lb

## 2013-06-10 DIAGNOSIS — G43719 Chronic migraine without aura, intractable, without status migrainosus: Secondary | ICD-10-CM

## 2013-06-10 DIAGNOSIS — G4700 Insomnia, unspecified: Secondary | ICD-10-CM

## 2013-06-10 DIAGNOSIS — G43709 Chronic migraine without aura, not intractable, without status migrainosus: Secondary | ICD-10-CM

## 2013-06-10 NOTE — Progress Notes (Signed)
[facility]   Provider:  Dr Hosie Poisson Referring Provider: Johny Blamer, MD Primary Care Physician:  Johny Blamer, MD  CC:  Chronic migraine  HPI:  Vicki Wilson is a 52 y.o. female here for initial Botox injections for chronic migraine. Continues to suffer from 4 to 5 migraines a week.    Contraindications and precautions discussed with patient. Aseptic procedure was observed and patient tolerated procedure. Procedure performed by Dr. Elspeth Cho.  The condition has existed for more than 6 months, and pt does not have a diagnosis of ALS, Myasthenia Gravis or Lambert-Eaton Syndrome.  Risks and benefits of injections discussed and pt agrees to proceed with the procedure.  Written consent obtained These injections are medically necessary. He receives good benefits from these injections. These injections do not cause sedations or hallucinations which the oral therapies may cause.  Indication/Diagnosis:chronic migraine  Type of toxin: Botox  Lot #[id number]  Expiration date:March 2017  Injection sites:  Muscle Site    L   R  Corrugator    5   5  Procerus        5  Frontalis    5x2   5x2   Temporalis    5x4   5x4    Occipitalis    5x3   5x3  Trapezius    5x3   5x3  Cervical paraspinals  5x2   5x2  History   Social History  . Marital Status: Single    Spouse Name: N/A    Number of Children: N/A  . Years of Education: N/A   Occupational History  . Not on file.   Social History Main Topics  . Smoking status: Never Smoker   . Smokeless tobacco: Never Used  . Alcohol Use: Yes     Comment: 5 oz of white wine every 3-6 months   . Drug Use: No  . Sexual Activity: Not on file   Other Topics Concern  . Not on file   Social History Narrative  . No narrative on file    Family History  Problem Relation Age of Onset  . High blood pressure Mother   . Hyperlipidemia Mother   . Gout Father   . High blood pressure Father   . Diabetes Mellitus I  Sister   . Parkinson's disease Maternal Grandmother   . Diabetes Mellitus I Paternal Grandmother   . Diabetes Mellitus I Paternal Grandfather     Past Medical History  Diagnosis Date  . Stroke   . Headache(784.0)   . Depression   . Anxiety   . Migraines   . Uterine fibroids affecting pregnancy   . IBS (irritable bowel syndrome)   . GERD (gastroesophageal reflux disease)   . PFO (patent foramen ovale)   . Abnormal Pap smear     ASCUS-08/1998  LGSIL/HPV- 01/1999    Past Surgical History  Procedure Laterality Date  . Tonsillectomy    . Bilateral inferior tubinate reduction  1998  . 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 Amino Acids (AMINO ACID PO) Take by mouth.      . Biotin 1000 MCG tablet Take 1,000 mcg by mouth 3 (three) times daily.      Marland Kitchen CARAFATE 1 GM/10ML suspension       . cyclobenzaprine (FLEXERIL) 5 MG tablet Take 5 mg by mouth as needed.       . fluticasone (  FLONASE) 50 MCG/ACT nasal spray Place 2 sprays into the nose daily as needed.       Marland Kitchen ibuprofen (ADVIL,MOTRIN) 800 MG tablet Take 800 mg by mouth every 8 (eight) hours as needed.      . medroxyPROGESTERone (PROVERA) 10 MG tablet Take 1 tablet (10 mg total) by mouth daily.  10 tablet  0  . Melatonin 10 MG TABS Take 10 tablets by mouth daily.      . mirtazapine (REMERON) 7.5 MG tablet Take 1 tablet (7.5 mg total) by mouth at bedtime.  30 tablet  3  . Multiple Vitamins-Iron (MULTIVITAMINS WITH IRON) TABS Take 1 tablet by mouth daily.      . naproxen sodium (ANAPROX) 220 MG tablet Take 220 mg by mouth 2 (two) times daily with a meal.      . omeprazole-sodium bicarbonate (ZEGERID) 40-1100 MG per capsule Take 1 capsule by mouth daily before breakfast.      . propranolol (INDERAL) 20 MG tablet Take 20 mg by mouth 2 (two) times daily.      Marland Kitchen venlafaxine XR (EFFEXOR-XR) 37.5 MG 24 hr capsule Take 37.5 mg by mouth daily.      .  Vitamin D, Ergocalciferol, (DRISDOL) 50000 UNITS CAPS Take 50,000 Units by mouth every 7 (seven) days.      Marland Kitchen zolpidem (AMBIEN CR) 12.5 MG CR tablet Take 12.5 mg by mouth at bedtime as needed for sleep.       No current facility-administered medications for this visit.    Allergies as of 06/10/2013 - Review Complete 06/10/2013  Allergen Reaction Noted  . Erythromycin  11/11/2011    Vitals: BP 114/72  Pulse 67  Ht 5\' 4"  (1.626 m)  Wt 150 lb (68.04 kg)  BMI 25.73 kg/m2 Last Weight:  Wt Readings from Last 1 Encounters:  06/10/13 150 lb (68.04 kg)   Last Height:   Ht Readings from Last 1 Encounters:  06/10/13 5\' 4"  (1.626 m)      Assessment:  After physical and neurologic examination, review of laboratory studies, imaging, neurophysiology testing and pre-existing records, assessment will be reviewed on the problem list.  Plan:  Treatment plan and additional workup will be reviewed under Problem List.  Vicki Wilson is a 52 y.o. female here for botox injections for chronic migraine headache  1) Botox injections as detailed above. A total of 155 units was used. 2) Tylenol or Motrin for injection site pain. 3) Medication guide dispensed. 4) Follow up for repeat injections in 3 months

## 2013-06-19 ENCOUNTER — Ambulatory Visit: Payer: BC Managed Care – PPO | Admitting: Certified Nurse Midwife

## 2013-06-26 ENCOUNTER — Ambulatory Visit: Payer: Self-pay | Admitting: Neurology

## 2013-06-29 ENCOUNTER — Ambulatory Visit: Payer: BC Managed Care – PPO | Admitting: Nurse Practitioner

## 2013-06-29 ENCOUNTER — Ambulatory Visit (INDEPENDENT_AMBULATORY_CARE_PROVIDER_SITE_OTHER): Payer: BC Managed Care – PPO | Admitting: Certified Nurse Midwife

## 2013-06-29 ENCOUNTER — Encounter: Payer: Self-pay | Admitting: Certified Nurse Midwife

## 2013-06-29 ENCOUNTER — Ambulatory Visit (HOSPITAL_COMMUNITY)
Admission: RE | Admit: 2013-06-29 | Discharge: 2013-06-29 | Disposition: A | Discharge status: Home / Self Care | Payer: BC Managed Care – PPO | Source: Ambulatory Visit | Attending: Obstetrics & Gynecology | Admitting: Obstetrics & Gynecology

## 2013-06-29 VITALS — BP 118/74 | HR 60 | Resp 16 | Ht 63.25 in | Wt 149.0 lb

## 2013-06-29 DIAGNOSIS — Z01419 Encounter for gynecological examination (general) (routine) without abnormal findings: Secondary | ICD-10-CM

## 2013-06-29 DIAGNOSIS — N951 Menopausal and female climacteric states: Secondary | ICD-10-CM

## 2013-06-29 DIAGNOSIS — Z1231 Encounter for screening mammogram for malignant neoplasm of breast: Secondary | ICD-10-CM | POA: Insufficient documentation

## 2013-06-29 NOTE — Patient Instructions (Signed)

## 2013-06-29 NOTE — Progress Notes (Signed)
Patient ID: Vicki Wilson, female   DOB: [birth year], 52 y.o.   MRN: [id number] 52 y.o. G0P0 Single Caucasian Fe here for annual exam. No spontaneous period this year without Provera. Last bleed was 7-14, recent Provera 10/ 14/14 without withdrawal bleeding. Having hot flashes, no night sweats, or vaginal dryness.No change in insomnia issues. Had mammogram this am. No other health issues. Has labs at work and sees PCP aex.   Patient's last menstrual period was 02/20/2013.          Sexually active: no  The current method of family planning is abstinence.    Exercising: no  Has not exercised regularly since 04/2013.  Planning to restart walking 3-4 times per week in 06/2013.  [place] walk [place] [employer] [place] this weekend. Smoker:  no  Health Maintenance: Pap:  06/27/12, WNL, neg HR HPV MMG:  06/29/13, no results at this time Colonoscopy:  06/01/13, polyps 5 years BMD:   2005, heel test TDaP:  2009 Labs: PCP   reports that she has never smoked. She has never used smokeless tobacco. She reports that she drinks alcohol. She reports that she does not use illicit drugs.  Past Medical History  Diagnosis Date  . Stroke   . Headache(784.0)   . Depression   . Anxiety   . Migraines   . Uterine fibroids affecting pregnancy   . IBS (irritable bowel syndrome)   . GERD (gastroesophageal reflux disease)   . PFO (patent foramen ovale)   . Abnormal Pap smear     ASCUS-08/1998  LGSIL/HPV- 01/1999    Past Surgical History  Procedure Laterality Date  . Tonsillectomy    . Bilateral inferior tubinate reduction  1998  . 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 Biotin 1000 MCG tablet Take 1,000 mcg by mouth 3 (three) times daily.      Marland Kitchen CARAFATE 1 GM/10ML suspension Take 1 g by mouth as needed.       . cyclobenzaprine (FLEXERIL) 5 MG tablet Take 5 mg by mouth as needed.       . fluticasone (FLONASE) 50 MCG/ACT  nasal spray Place 2 sprays into the nose daily as needed.       Marland Kitchen ibuprofen (ADVIL,MOTRIN) 800 MG tablet Take 800 mg by mouth every 8 (eight) hours as needed.      . Melatonin 10 MG TABS Take 10 tablets by mouth daily.      . Multiple Vitamins-Iron (MULTIVITAMINS WITH IRON) TABS Take 1 tablet by mouth daily.      . naproxen sodium (ANAPROX) 220 MG tablet Take 220 mg by mouth 2 (two) times daily with a meal.      . omeprazole-sodium bicarbonate (ZEGERID) 40-1100 MG per capsule Take 1 capsule by mouth daily before breakfast.      . propranolol (INDERAL) 20 MG tablet Take 20 mg by mouth 2 (two) times daily.      Marland Kitchen venlafaxine XR (EFFEXOR-XR) 37.5 MG 24 hr capsule Take 37.5 mg by mouth daily.      . Vitamin D, Ergocalciferol, (DRISDOL) 50000 UNITS CAPS Take 50,000 Units by mouth every 14 (fourteen) days.       Marland Kitchen zolpidem (AMBIEN CR) 12.5 MG CR tablet Take 12.5 mg by mouth at bedtime as needed for sleep.       No current facility-administered medications for this visit.    Family History  Problem Relation  Age of Onset  . High blood pressure Mother   . Hyperlipidemia Mother   . Gout Father   . High blood pressure Father   . Diabetes Mellitus I Sister   . Parkinson's disease Maternal Grandmother   . Diabetes Mellitus I Paternal Grandmother   . Diabetes Mellitus I Paternal Grandfather     ROS:  Pertinent items are noted in HPI.  Otherwise, a comprehensive ROS was negative.  Exam:   BP 118/74  Pulse 60  Resp 16  Ht 5' 3.25" (1.607 m)  Wt 149 lb (67.586 kg)  BMI 26.17 kg/m2  LMP 02/20/2013 Height: 5' 3.25" (160.7 cm)  Ht Readings from Last 3 Encounters:  06/29/13 5' 3.25" (1.607 m)  06/10/13 5\' 4"  (1.626 m)  05/11/13 5\' 4"  (1.626 m)    General appearance: alert, cooperative and appears stated age Head: Normocephalic, without obvious abnormality, atraumatic Neck: no adenopathy, supple, symmetrical, trachea midline and thyroid normal to inspection and palpation Lungs: clear to  auscultation bilaterally Breasts: normal appearance, no masses or tenderness, No nipple retraction or dimpling, No nipple discharge or bleeding, No axillary or supraclavicular adenopathy Heart: regular rate and rhythm Abdomen: soft, non-tender; no masses,  no organomegaly Extremities: extremities normal, atraumatic, no cyanosis or edema Skin: Skin color, texture, turgor normal. No rashes or lesions Lymph nodes: Cervical, supraclavicular, and axillary nodes normal. No abnormal inguinal nodes palpated Neurologic: Grossly normal   Pelvic: External genitalia:  no lesions              Urethra:  normal appearing urethra with no masses, tenderness or lesions              Bartholin's and Skene's: normal                 Vagina: normal appearing vagina with normal color and discharge, no lesions              Cervix: normal and non tender              Pap taken: no Bimanual Exam:  Uterus:  enlarged 8 week size,firm more on right, history of fibroids, no size change since last visit.              Adnexa: normal adnexa and no mass, fullness, tenderness               Rectovaginal: Confirms               Anus:  normal sphincter tone, no lesions  A:  Well Woman with normal exam  Perimenopausal with amenorrhea with Provera use for ,no withdrawal bleeding from 10/14.  History of fibroids no size change  History of stroke in past  P:   Reviewed health and wellness pertinent to exam.  Patient instructed to continue menses calendar and if no menses in next three months to call.  Lab [facility], discussed expectations with perimenopausal changes  Continue PCP follow up as indicated.   Pap smear as per guidelines   Mammogram yearly pap smear not taken today counseled on breast self exam, mammography screening, adequate intake of calcium and vitamin D, diet and exercise  return annually or prn  An After Visit Summary was printed and given to the patient.

## 2013-06-30 ENCOUNTER — Telehealth: Payer: Self-pay

## 2013-06-30 NOTE — Telephone Encounter (Signed)
Message copied by Eliezer Bottom on Tue Jun 30, 2013 10:25 AM ------      Message from: Verner Chol      Created: Tue Jun 30, 2013  8:21 AM       Notify [facility] shows menopausal range may or may not have any more bleeding, will not repeat Provera      Needs to notify if bleeding occurs again spontaneously ------

## 2013-06-30 NOTE — Telephone Encounter (Signed)
[email or web address]

## 2013-06-30 NOTE — Progress Notes (Signed)
Note reviewed, agree with plan.  Dago Jungwirth, MD  

## 2013-07-01 NOTE — Telephone Encounter (Signed)
Left message for call back.

## 2013-07-02 NOTE — Telephone Encounter (Signed)
Patient notified of results.

## 2013-07-13 ENCOUNTER — Telehealth: Payer: Self-pay | Admitting: *Deleted

## 2013-07-14 ENCOUNTER — Other Ambulatory Visit: Payer: Self-pay | Admitting: Neurology

## 2013-07-14 MED ORDER — GABAPENTIN 100 MG PO CAPS: 100.0000 mg | ORAL_CAPSULE | Freq: Three times a day (TID) | ORAL | Status: DC

## 2013-07-14 NOTE — Telephone Encounter (Signed)
Patient called back. Will start low dose gabapentin (100mg  TID). Referral placed to see [facility] for insomnia evaluation.

## 2013-07-22 ENCOUNTER — Telehealth: Payer: Self-pay | Admitting: Neurology

## 2013-07-22 NOTE — Telephone Encounter (Signed)
Calling to check the status of an appt with Dr. Vickey Huger that Dr. Hosie Poisson told her he would look into.

## 2013-07-22 NOTE — Telephone Encounter (Signed)
I called and left a  message for the patient to callback to the office to speak with me about schedule a sleep consult and to inform her that our sleep physicians do not treat chronic insomnia, but Dr. Frances Furbish will see her for a consultation.

## 2013-07-23 ENCOUNTER — Ambulatory Visit: Payer: BC Managed Care – PPO | Admitting: Nurse Practitioner

## 2013-07-27 ENCOUNTER — Telehealth: Payer: Self-pay | Admitting: Neurology

## 2013-07-27 NOTE — Telephone Encounter (Signed)
I called and left a message for the patient to callback to schedule a sleep consultation with Dr. Casper Harrison

## 2013-08-03 ENCOUNTER — Telehealth: Payer: Self-pay | Admitting: Neurology

## 2013-08-03 NOTE — Telephone Encounter (Signed)
Patient called stating her insurance has started over and she would have to pay the full cost of the visit to have an office visit with Dr. Frances Furbish. Patient stated she had already spoken to Dr. Frances Furbish before switching to Dr. Hosie Poisson. Patient will callback next week to leave a message for Dr. Hosie Poisson about her insomnia issue.

## 2013-09-07 ENCOUNTER — Telehealth: Payer: Self-pay | Admitting: Nurse Practitioner

## 2013-09-07 NOTE — Telephone Encounter (Signed)
LMTCB

## 2013-09-07 NOTE — Telephone Encounter (Signed)
Spoke with pt who started her period on 08-28-13 and still is having bleeding today. Pt was told she was menopausal and probably would not have any more periods. Pt noticed some cramping before it started and has noticed some clots in the toilet. Pt changing her pad 3-4 times a day and states it is not as heavy as it used to be in the past. Pt was told to call with any amount of bleeding. Please advise.

## 2013-09-07 NOTE — Telephone Encounter (Signed)
Pt started her period on the 16th and was told to call if she started.

## 2013-09-08 NOTE — Telephone Encounter (Signed)
LMTCB for appt.

## 2013-09-08 NOTE — Telephone Encounter (Signed)
Notify patient as we discussed her [place] was menopausal range and she may or may not have any more bleeding. She had taken Provera throughout the year and had no withdrawal bleeding, but may have thinned endometrium also and this is in response to no Provera. I still prefer she have office visit to assess. Please schedule.

## 2013-09-08 NOTE — Telephone Encounter (Signed)
Routing to [place] per Milford Cage, FNP request.

## 2013-09-09 NOTE — Telephone Encounter (Signed)
Spoke with pt to schedule appt with DL to further investigate bleeding without provera. Pt asked for pm appt 10-01-13. Sched OV with DL 10-01-13 at 2:30.

## 2013-09-10 ENCOUNTER — Encounter: Payer: Self-pay | Admitting: Neurology

## 2013-09-10 ENCOUNTER — Ambulatory Visit (INDEPENDENT_AMBULATORY_CARE_PROVIDER_SITE_OTHER): Payer: BC Managed Care – PPO | Admitting: Neurology

## 2013-09-10 VITALS — BP 104/64 | HR 88 | Ht 63.75 in | Wt 146.0 lb

## 2013-09-10 DIAGNOSIS — G43709 Chronic migraine without aura, not intractable, without status migrainosus: Secondary | ICD-10-CM

## 2013-09-10 DIAGNOSIS — G43719 Chronic migraine without aura, intractable, without status migrainosus: Secondary | ICD-10-CM

## 2013-09-10 DIAGNOSIS — IMO0002 Reserved for concepts with insufficient information to code with codable children: Secondary | ICD-10-CM

## 2013-09-10 NOTE — Progress Notes (Signed)
[facility]   Provider:  Dr Janann Colonel Referring Provider: Shirline Frees, MD Primary Care Physician:  Shirline Frees, MD  CC:  Chronic migraine  HPI:  Vicki Wilson is a 53 y.o. female here for initial Botox injections for chronic migraine. Initial set of injections were on 05/2013, reports good improvement with these injections. Headaches decreased to 2-3 per week (down from 4-6). Headache severity/frequency increased in the past 2-3 weeks). No adverse effects.    Contraindications and precautions discussed with patient. Aseptic procedure was observed and patient tolerated procedure. Procedure performed by Dr. Jim Like.  The condition has existed for more than 6 months, and pt does not have a diagnosis of ALS, Myasthenia Gravis or Lambert-Eaton Syndrome.  Risks and benefits of injections discussed and pt agrees to proceed with the procedure.  Written consent obtained These injections are medically necessary. He receives good benefits from these injections. These injections do not cause sedations or hallucinations which the oral therapies may cause.  Indication/Diagnosis:chronic migraine  Type of toxin: Botox  Lot #[id number]  Expiration date:March 2017  Injection sites:  Muscle Site    L   R  Corrugator    5   5  Procerus        5  Frontalis    5x2   5x2   Temporalis    5x4   5x4    Occipitalis    5x3   5x3  Trapezius    5x3   5x3  Cervical paraspinals  5x2   5x2  History   Social History  . Marital Status: Single    Spouse Name: N/A    Number of Children: 0  . Years of Education: 2 Masters   Occupational History  . PHYSICIAN ASSISTANT    Social History Main Topics  . Smoking status: Never Smoker   . Smokeless tobacco: Never Used  . Alcohol Use: Yes     Comment: 5 oz of white wine every 3-6 months   . Drug Use: No  . Sexual Activity: No   Other Topics Concern  . Not on file   Social History Narrative   Patient is single no  children, right handed,    Education level is 2 Master's degree   Caffeine consumption is 0    Family History  Problem Relation Age of Onset  . High blood pressure Mother   . Hyperlipidemia Mother   . Gout Father   . High blood pressure Father   . Diabetes Mellitus I Sister   . Parkinson's disease Maternal Grandmother   . Diabetes Mellitus I Paternal Grandmother   . Diabetes Mellitus I Paternal Grandfather     Past Medical History  Diagnosis Date  . Stroke   . Headache(784.0)   . Depression   . Anxiety   . Migraines   . Uterine fibroids affecting pregnancy   . IBS (irritable bowel syndrome)   . GERD (gastroesophageal reflux disease)   . PFO (patent foramen ovale)   . Abnormal Pap smear     ASCUS-08/1998  LGSIL/HPV- 01/1999    Past Surgical History  Procedure Laterality Date  . Tonsillectomy    . Bilateral inferior tubinate reduction  1998  . 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 CARAFATE 1 GM/10ML suspension Take 1 g by mouth as needed.       . cyclobenzaprine (FLEXERIL) 5 MG  tablet Take 5 mg by mouth as needed.       . fluticasone (FLONASE) 50 MCG/ACT nasal spray Place 2 sprays into the nose daily as needed.       . gabapentin (NEURONTIN) 100 MG capsule Take 1 capsule (100 mg total) by mouth 3 (three) times daily.  90 capsule  3  . ibuprofen (ADVIL,MOTRIN) 800 MG tablet Take 800 mg by mouth every 8 (eight) hours as needed.      . Melatonin 10 MG TABS Take 10 tablets by mouth daily.      . Multiple Vitamins-Iron (MULTIVITAMINS WITH IRON) TABS Take 1 tablet by mouth daily.      Marland Kitchen omeprazole-sodium bicarbonate (ZEGERID) 40-1100 MG per capsule Take 1 capsule by mouth daily before breakfast.      . propranolol (INDERAL) 20 MG tablet Take 20 mg by mouth 2 (two) times daily.      Marland Kitchen venlafaxine XR (EFFEXOR-XR) 37.5 MG 24 hr capsule Take 37.5 mg by mouth daily.      . Vitamin D,  Ergocalciferol, (DRISDOL) 50000 UNITS CAPS Take 50,000 Units by mouth every 14 (fourteen) days.       Marland Kitchen zolpidem (AMBIEN CR) 12.5 MG CR tablet Take 12.5 mg by mouth at bedtime as needed for sleep.       No current facility-administered medications for this visit.    Allergies as of 09/10/2013 - Review Complete 09/10/2013  Allergen Reaction Noted  . Erythromycin  11/11/2011    Vitals: BP 104/64  Pulse 88  Ht 5' 3.75" (1.619 m)  Wt 146 lb (66.225 kg)  BMI 25.27 kg/m2 Last Weight:  Wt Readings from Last 1 Encounters:  09/10/13 146 lb (66.225 kg)   Last Height:   Ht Readings from Last 1 Encounters:  09/10/13 5' 3.75" (1.619 m)      Assessment:  After physical and neurologic examination, review of laboratory studies, imaging, neurophysiology testing and pre-existing records, assessment will be reviewed on the problem list.  Plan:  Treatment plan and additional workup will be reviewed under Problem List.  Vicki Wilson is a 53 y.o. female here for botox injections for chronic migraine headache  1) Botox injections as detailed above. A total of 155 units was used. 2) Tylenol or Motrin for injection site pain. 3) Medication guide dispensed. 4) Follow up for repeat injections in 3 months 5) Can consider Rozerem in the future for insomnia treatment if needed

## 2013-09-16 ENCOUNTER — Telehealth: Payer: Self-pay | Admitting: Neurology

## 2013-09-16 NOTE — Telephone Encounter (Signed)
I called pt and she will come in at 1400.

## 2013-09-16 NOTE — Telephone Encounter (Signed)
HAD BOTOX LAST WEEK AND HAVING HEADACHES SINCE

## 2013-09-16 NOTE — Telephone Encounter (Signed)
Called patient back, will set her up for Depacon infusion.

## 2013-09-17 ENCOUNTER — Ambulatory Visit: Payer: Self-pay

## 2013-09-17 ENCOUNTER — Telehealth: Payer: Self-pay | Admitting: Neurology

## 2013-09-17 NOTE — Telephone Encounter (Signed)
Patient called to state she has no migraine today, is wondering if she still needs to come in for depacon infusion today. Please call, ok to leave detailed voicemail message since she may not be able to get to her phone in time.

## 2013-09-17 NOTE — Telephone Encounter (Signed)
I called pt and relayed that if not having migraine would not come in.   If returns then to call back and we can schedule again.  She verbalized understanding.

## 2013-09-28 NOTE — Telephone Encounter (Signed)
Patient rescheduled her appointment for bleeding from 10/01/13 to 10/22/13 due to her grandmother's passing and a conflict with her grandmother's funeral.

## 2013-10-01 ENCOUNTER — Ambulatory Visit: Payer: BC Managed Care – PPO | Admitting: Certified Nurse Midwife

## 2013-10-12 ENCOUNTER — Other Ambulatory Visit: Payer: Self-pay | Admitting: Neurology

## 2013-10-12 ENCOUNTER — Telehealth: Payer: Self-pay | Admitting: Neurology

## 2013-10-12 MED ORDER — LEVETIRACETAM 500 MG PO TABS: 500.0000 mg | ORAL_TABLET | Freq: Two times a day (BID) | ORAL | Status: DC

## 2013-10-12 MED ORDER — RAMELTEON 8 MG PO TABS: 8.0000 mg | ORAL_TABLET | Freq: Every day | ORAL | Status: DC

## 2013-10-12 NOTE — Telephone Encounter (Signed)
Returned call with no answer. Please let her know to stop the gabapentin and start keppra 500mg  twice a day for headache prevention. She can stop the [place] and try Rozerem 8mg  nightly. Thanks.

## 2013-10-12 NOTE — Telephone Encounter (Signed)
Pt returned call.  I spoke with her and gave her the information re: stopping ambien and gabapentin.  She is to start the keppra 500mg  po bid and the rozerem 8mg  po qhs.   I instructed her to start one of the medications and see how she reacts, to make sure no side effects then if ok to start the other.  She verbalized understanding.

## 2013-10-12 NOTE — Telephone Encounter (Signed)
Pt called and asked if Dr. Janann Colonel or his nurse could call her.  She stated that the gabapentin is giving her dry mouth and it is not controlling her migraines.  Also she states that with her Ambien CR for her insomnia is giving her only a max of 2-3 hours of sleep per night.  Please call to discuss.  Thank you

## 2013-10-12 NOTE — Telephone Encounter (Signed)
Prior note should have been sent to [employer].   Returned call with no answer. Please let her know to stop the gabapentin and start keppra 500mg  twice a day for headache prevention. She can stop the [place] and try Rozerem 8mg  nightly. Thanks.

## 2013-10-12 NOTE — Telephone Encounter (Signed)
Dr Janann Colonel, Please advise.  Thank you.

## 2013-10-22 ENCOUNTER — Ambulatory Visit (INDEPENDENT_AMBULATORY_CARE_PROVIDER_SITE_OTHER): Payer: BC Managed Care – PPO | Admitting: Certified Nurse Midwife

## 2013-10-22 ENCOUNTER — Encounter: Payer: Self-pay | Admitting: Certified Nurse Midwife

## 2013-10-22 VITALS — BP 119/83 | HR 73 | Resp 16 | Ht 63.25 in | Wt 145.0 lb

## 2013-10-22 DIAGNOSIS — D219 Benign neoplasm of connective and other soft tissue, unspecified: Secondary | ICD-10-CM

## 2013-10-22 DIAGNOSIS — N951 Menopausal and female climacteric states: Secondary | ICD-10-CM

## 2013-10-22 DIAGNOSIS — D259 Leiomyoma of uterus, unspecified: Secondary | ICD-10-CM

## 2013-10-22 DIAGNOSIS — N950 Postmenopausal bleeding: Secondary | ICD-10-CM

## 2013-10-22 NOTE — Progress Notes (Signed)
53 y.o. Married [place] American female [place] with LMP 08/28/13 here with no bleeding from 7/14 until 08/28/13. Patient had [facility] 11/14 which was 89.1. Patient had Provera challenge without any bleeding 10/14. Patient had no spontaneous menses without Provera use, prior to 10/14. Patient describes bleeding as period like, with cramping and heavy and 4-5  Day duration. Contraception is abstinence.Patient also has history of fibroids which last PUS showed size approximately 11-12 week. No size change on last aex 11/14. Patient denies any hot flashes, now but previously. No other health issues.   O: Healthy [place] female   Alert oriented x3 Skin:warm and dry Thyroid:normal to inspection and palpation and non-palpable Pelvic exam: External genital: normal, no lesions Vagina: normal, moist Cervix: normal, non tender Uterus: 12 week size with more known firmness on right( no size change) Adnexa: non tender, not palpable on right, no masses palpated   A:Normal Pelvic exam 2-Perimenopausal vs menopausal with period like bleeding. Probable still perimenopausal, due to it has not been a year without bleeding. 3-Uterine fibroids, no size change in uterus    P:Reviewed findings of normal pelvic exam 2-Discussed perimenopausal vs menopausal bleeding and concerns and that fibroids can also contribute to bleeding. Lab:TSH,FSH Continue menses calendar and advise if more bleeding.   RV prn

## 2013-10-23 LAB — TSH: TSH: 1.337 u[IU]/mL (ref 0.350–4.500)

## 2013-10-23 LAB — FOLLICLE STIMULATING HORMONE: FSH: 88 m[IU]/mL

## 2013-10-30 ENCOUNTER — Telehealth: Payer: Self-pay | Admitting: Obstetrics & Gynecology

## 2013-10-30 NOTE — Addendum Note (Signed)
Addended by: Megan Salon on: 10/30/2013 01:53 AM   Modules accepted: Orders

## 2013-10-30 NOTE — Telephone Encounter (Signed)
Message left to return call to Tracy at [phone].    

## 2013-10-30 NOTE — Progress Notes (Signed)
FSH and TSH normal.  Patient needs SHGM and endometrial biopsy.  Orders placed.  Will have [place] call patient and advise.  Reviewed personally.  Felipa Emory, MD.

## 2013-10-30 NOTE — Telephone Encounter (Signed)
Spoke with patient. Message from Dr. Sabra Heck discussed. Patient is hesitant to schedule as she states "The bleeding was just that one time." Discussed that she needs this evaluation to rule out causes of bleeding. Patient agreeable to scheduling. Requests April 2nd. Declines earlier appointment due to work schedule. 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 Appointment scheduled for 4/2 at 1530. Pelvic U/S scheduled and patient aware/agreeable to time.  Patient verbalized understanding of the U/S appointment cancellation policy. Advised will need to cancel within 72 business hours (3 business days) or will have $150.00 for [facility] no show fee placed to account.   Routing to provider for final review. Patient agreeable to disposition. Will close encounter  Routed to Dr. Sabra Heck CC Gabriel Cirri

## 2013-10-30 NOTE — Telephone Encounter (Signed)
Message copied by Jasmine Awe on Fri Oct 30, 2013  8:38 AM ------      Message from: Megan Salon      Created: Fri Oct 30, 2013  1:50 AM      Regarding: needs [facility] and endometrial biopsy       Olivia Mackie,      Pt seen by DL earlier this week.  Had PMP bleeding.  [place] is in 80's.  She should not be bleeding.  Needs SHGM and endometrial biopsy.  Orders placed.  Can you call and discuss with pt and schedule?  Thanks.            MSM ------

## 2013-11-02 ENCOUNTER — Telehealth: Payer: Self-pay | Admitting: Obstetrics & Gynecology

## 2013-11-02 NOTE — Telephone Encounter (Signed)
Left message for patient to call back. Need to go over quoted benefits for services scheduled 04.02.2015.Marland KitchenMarland KitchenPR: $287.74

## 2013-11-03 NOTE — Telephone Encounter (Signed)
Patient called. Advised that patient responsibility for [facility] and Endo Bx would be $287.74. Patient asked...what if: only SHGM was performed, I advised that out of pocket costs of SHGM only would be $186.17 and out of pocket costs for Endo Bx alone would be $101.57. If the doc confirms that only [facility] was performed, the front desk can refund her $101.57.

## 2013-11-12 ENCOUNTER — Other Ambulatory Visit: Payer: Self-pay | Admitting: Obstetrics & Gynecology

## 2013-11-12 ENCOUNTER — Other Ambulatory Visit: Payer: Self-pay

## 2013-11-12 ENCOUNTER — Ambulatory Visit (INDEPENDENT_AMBULATORY_CARE_PROVIDER_SITE_OTHER): Payer: BC Managed Care – PPO

## 2013-11-12 ENCOUNTER — Ambulatory Visit (INDEPENDENT_AMBULATORY_CARE_PROVIDER_SITE_OTHER): Payer: BC Managed Care – PPO | Admitting: Obstetrics & Gynecology

## 2013-11-12 DIAGNOSIS — D219 Benign neoplasm of connective and other soft tissue, unspecified: Secondary | ICD-10-CM

## 2013-11-12 DIAGNOSIS — N950 Postmenopausal bleeding: Secondary | ICD-10-CM

## 2013-11-12 DIAGNOSIS — D259 Leiomyoma of uterus, unspecified: Secondary | ICD-10-CM

## 2013-11-12 NOTE — Progress Notes (Signed)
53 y.o.Singlefemale here for a pelvic ultrasound due to PMP bleeding.  Pt has had two FSH's over last several months.  Both have been elevated in the 80 range.  In between these two [facility] levels was an episode of light bleeding right after the holidays.  Pt really didn't think much of it.  Due to the two [facility] levels, she is here for PUS.  Pt with known hx of uterine fibroids.  Patient's last menstrual period was 08/28/2013.  Sexually active:  no  Contraception: abstinence  Technique:  Both transabdominal and transvaginal ultrasound  examinations of the pelvis were performed. Transabdominal technique  was performed for global imaging of the pelvis including uterus,  ovaries, adnexal regions, and pelvic cul-de-sac.  It was necessary to proceed with endovaginal exam following the abdominal ultrasound transabdominal exam to visualize the endometrium and adnexa.  Color and duplex Doppler ultrasound was utilized to evaluate blood  flow to the ovaries.    FINDINGS:  UTERUS: 9.4 x 6.1 x 6.3cm (this is slightly smaller in comparison to prior u/s) EMS: 57mm ADNEXA: Left: 2.2 x 1.4 x 1.7cm Right: 2.1 x 1.2 x 1.1cm CUL DE SAC: no free fluid  SHSG:  After obtaining appropriate verbal consent from patient, the cervix was visualized using a speculum, and prepped with betadine.  A tenaculum  was applied to the cervix.  Dilation of the cervix was not necessary. The catheter was passed into the uterus and sterile saline introduced, with the following findings: thin and symmetric endometrium with encroachment from fibroid.  Images reviewed with pt.  Comparison made to prior u/s in paper chart. Reviewed current and prior uterine size and fibroid size.  Endometrium initially appears thickened but after SHGM, endometrium is actually much thinner in size.   Assessment:  PMP bleeding in pt with uterine fibroids and elevated FSH Plan: I would like to repeat [place] when pt is bleeding if she has any other episodes.   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  As endometrium is symmetric and much thinner in appearance with saline infusion, do not feel endometrial biopsy is necessary.  Pt very comfortable with plan and knows to call with any future bleeding.    ~20 minutes spent with patient >50% of time was in face to face discussion of above.

## 2013-11-17 ENCOUNTER — Encounter: Payer: Self-pay | Admitting: Obstetrics & Gynecology

## 2013-11-17 DIAGNOSIS — D219 Benign neoplasm of connective and other soft tissue, unspecified: Secondary | ICD-10-CM | POA: Insufficient documentation

## 2013-12-13 ENCOUNTER — Other Ambulatory Visit: Payer: Self-pay | Admitting: Neurology

## 2013-12-21 ENCOUNTER — Encounter: Payer: Self-pay | Admitting: Neurology

## 2013-12-21 ENCOUNTER — Ambulatory Visit (INDEPENDENT_AMBULATORY_CARE_PROVIDER_SITE_OTHER): Payer: BC Managed Care – PPO | Admitting: Neurology

## 2013-12-21 ENCOUNTER — Encounter (INDEPENDENT_AMBULATORY_CARE_PROVIDER_SITE_OTHER): Payer: Self-pay

## 2013-12-21 VITALS — BP 109/74 | HR 68 | Ht 64.0 in | Wt 149.0 lb

## 2013-12-21 DIAGNOSIS — G43709 Chronic migraine without aura, not intractable, without status migrainosus: Secondary | ICD-10-CM

## 2013-12-21 DIAGNOSIS — IMO0002 Reserved for concepts with insufficient information to code with codable children: Secondary | ICD-10-CM

## 2013-12-21 DIAGNOSIS — G4700 Insomnia, unspecified: Secondary | ICD-10-CM

## 2013-12-21 MED ORDER — SUVOREXANT 10 MG PO TABS: 10.0000 mg | ORAL_TABLET | Freq: Every day | ORAL | Status: DC

## 2013-12-21 NOTE — Progress Notes (Signed)
Provider:  Dr Janann Colonel Referring Provider: Shirline Frees, MD Primary Care Physician:  Shirline Frees, MD  CC:  headaches  HPI:  Vicki Wilson is a 53 y.o. female here as a follow up with last visit on 08/2013 at which time she received Botox injections. She did not get any benefit from these injections. Since then was started on Keppra 500mg  BID for headache and Rozerem for insomnia. States today that she did not start the keppra due to the concern of side effects. Currently taking Inderal 20mg  BID for headache prevention. Currently still having 3 to 4 headaches a week. Has not been able to identify any triggers at this time besides for lack of sleep. Tried Rozerem but has not gotten any benefit, now has been taking Ambien ER 12.5mg  nightly. Still not getting any relief from this.   Has been on multiple prophylactic agents in the past, including Topamax, Pamelor, propranolol, unsure what she has been on. Has not done any benefit from these medications or has not been able to tolerate some of them.  Continues  to have difficulty with insomnia. Has tried Ambien, Ambien CR, Lunesta, Sonata, Pamelor, Trazodone, Elavil. Currently taking Ambien CR and Melatonin 10mg  nightly. Has not gotten much benefit from this regimen.   Per old records  with Dr Rexene Alberts and Dr Erling Cruz: In summary, Vicki Wilson is a very pleasant 53 y.o.-year old female previously followed by Dr. Erling Cruz with a history of migraine without aura. She has been tried on multiple different abortive medications and has tried preventative medications in the form of imipramine and Topamax as well as Effexor or in the past and is currently on low-dose Effexor and low-dose propranolol. I would like to suggest a cautious increase in her propranolol to 10 mg 3 times a day. On 40 mg daily she had diarrhea in the past. We will have to see how it goes. I had a long chat with the patient regarding her symptoms. Her exam is nonfocal and I reassured her in  that regard. She is advised to continue using Flexeril and over-the-counter anti-inflammatory as needed for abortive treatment. She had side effects with several triptans in the past. She is encouraged to followup with her primary care physician for her chronic insomnia. I did give her a prescription for propranolol 10 mg 3 times a day with refills. I suggested a six-month followup with one of my colleagues for her chronic migraines. I answered all her questions today and she is encouraged to call with any interim problems. She was in agreement.  Concerns/Questions:Review of Systems: Out of a complete 14 system review, the patient complains of only the following symptoms, and all other reviewed systems are negative. Other for fatigue headache passing out insomnia not enough sleep  History   Social History  . Marital Status: Single    Spouse Name: N/A    Number of Children: 0  . Years of Education: 2 Masters   Occupational History  . PHYSICIAN ASSISTANT    Social History Main Topics  . Smoking status: Never Smoker   . Smokeless tobacco: Never Used  . Alcohol Use: Yes     Comment: 5 oz of white wine every 3-6 months   . Drug Use: No  . Sexual Activity: No   Other Topics Concern  . Not on file   Social History Narrative   Patient is single no children, right handed,    Education level is 2 Master's degree   Caffeine  consumption is 0    Family History  Problem Relation Age of Onset  . High blood pressure Mother   . Hyperlipidemia Mother   . Gout Father   . High blood pressure Father   . Diabetes Mellitus I Sister   . Other Sister     seizure disorder  . Alzheimer's disease Maternal Grandmother   . Cancer Paternal Grandmother     colon  . Parkinson's disease Maternal Grandfather     Past Medical History  Diagnosis Date  . Stroke   . Headache(784.0)   . Depression   . Anxiety   . Migraines   . Uterine fibroids affecting pregnancy   . IBS (irritable bowel syndrome)     . GERD (gastroesophageal reflux disease)   . PFO (patent foramen ovale)   . Abnormal Pap smear     ASCUS-08/1998  LGSIL/HPV- 01/1999    Past Surgical History  Procedure Laterality Date  . Tonsillectomy    . Bilateral inferior tubinate reduction  1998  . 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 CARAFATE 1 GM/10ML suspension Take 1 g by mouth as needed.       . cyclobenzaprine (FLEXERIL) 5 MG tablet Take 5 mg by mouth as needed.       . fluticasone (FLONASE) 50 MCG/ACT nasal spray Place 2 sprays into the nose daily as needed.       Marland Kitchen ibuprofen (ADVIL,MOTRIN) 800 MG tablet Take 800 mg by mouth every 8 (eight) hours as needed.      . levETIRAcetam (KEPPRA) 500 MG tablet Take 1 tablet (500 mg total) by mouth every 12 (twelve) hours.  60 tablet  3  . Melatonin 10 MG TABS Take 10 tablets by mouth daily. Taking 1/2 - 1 tab QHS  prn      . Multiple Vitamins-Iron (MULTIVITAMINS WITH IRON) TABS Take 1 tablet by mouth daily.      Marland Kitchen omeprazole-sodium bicarbonate (ZEGERID) 40-1100 MG per capsule Take 1 capsule by mouth daily before breakfast.      . propranolol (INDERAL) 20 MG tablet Take 20 mg by mouth 2 (two) times daily.      Marland Kitchen triamcinolone cream (KENALOG) 0.1 %       . venlafaxine XR (EFFEXOR-XR) 37.5 MG 24 hr capsule take 1 capsule by mouth once daily for 2 weeks, then take 2 capsules by mouth once daily.  60 capsule  6  . zolpidem (AMBIEN) 10 MG tablet Take 12.5 mg by mouth at bedtime as needed for sleep.        No current facility-administered medications for this visit.    Allergies as of 12/21/2013 - Review Complete 12/21/2013  Allergen Reaction Noted  . Erythromycin  11/11/2011    Vitals: BP 109/74  Pulse 68  Ht 5\' 4"  (1.626 m)  Wt 149 lb (67.586 kg)  BMI 25.56 kg/m2 Last Weight:  Wt Readings from Last 1 Encounters:  12/21/13 149 lb (67.586 kg)   Last Height:   Ht Readings from  Last 1 Encounters:  12/21/13 5\' 4"  (1.626 m)     Physical exam: Exam: Gen: NAD, conversant Eyes: anicteric sclerae, moist conjunctivae HENT: Atraumatic, oropharynx clear Lungs: CTA, no wheezing, rales, rhonic                          CV: RRR, no MRG Abdomen: Soft, non-tender;  Extremities: No peripheral edema  Skin: Normal temperature, no rash,  Psych: Appropriate affect, pleasant  Neuro: Vicki: AA&Ox3, appropriately interactive, normal affect   Speech: fluent w/o paraphasic error  Memory: good recent and remote recall  CN: PERRL, EOMI no nystagmus, no ptosis, unable to visualize optic disc bilat, VFF to FC bilat, sensation intact to LT V1-V3 bilat, face symmetric, no weakness, hearing grossly intact, palate elevates symmetrically, shoulder shrug 5/5 bilat,  tongue protrudes midline, no fasiculations noted.  Motor: normal bulk and tone Strength: 5/5  In all extremities  Coord: rapid alternating and point-to-point (FNF, HTS) movements intact.  Reflexes: symmetrical, bilat downgoing toes  Sens: LT intact in all extremities  Gait: posture, stance, stride and arm-swing normal. .   Assessment:  After physical and neurologic examination, review of laboratory studies, imaging, neurophysiology testing and pre-existing records, assessment will be reviewed on the problem list.  Plan:  Treatment plan and additional workup will be reviewed under Problem List.  1)Migraine without aura 2)Insomnia  Vicki Wilson is a pleasant 53 year old woman presenting for followup evaluation of chronic migraine headache. She has been tried on multiple agents with no notable therapeutic benefit. She has gone through 2 courses of Botox and is due for her 3rd cycle, due to cost will hold off at this time. Continues to have difficulty with insomnia which is likely the main trigger of her frequent headaches. Will continue inderal for headache prophylaxis, will add in magnesium 400mg  daily. Will start Belsomra  10mg  nightly for sleep. Will follow up in 4 months.

## 2013-12-21 NOTE — Patient Instructions (Signed)
Overall you are doing fairly well but I do want to suggest a few things today:   Remember to drink plenty of fluid, eat healthy meals and do not skip any meals. Try to eat protein with a every meal and eat a healthy snack such as fruit or nuts in between meals. Try to keep a regular sleep-wake schedule and try to exercise daily, particularly in the form of walking, 20-30 minutes a day, if you can.   As far as your medications are concerned, I would like to suggest you try Belsomra 10mg  nightly for your insomnia.   Try taking Magnesium 400mg  daily for your headaches.   Follow up in 4 months. Please call us with any interim questions, concerns, problems, updates or refill requests.   My clinical assistant and will answer any of your questions and relay your messages to me and also relay most of my messages to you.   Our phone number is [phone]. We also have an after hours call service for urgent matters and there is a physician on-call for urgent questions. For any emergencies you know to call 911 or go to the nearest emergency room

## 2014-01-11 ENCOUNTER — Telehealth: Payer: Self-pay | Admitting: Neurology

## 2014-01-11 ENCOUNTER — Other Ambulatory Visit: Payer: Self-pay | Admitting: Neurology

## 2014-01-11 DIAGNOSIS — G4700 Insomnia, unspecified: Secondary | ICD-10-CM

## 2014-01-11 NOTE — Telephone Encounter (Signed)
Patient called to make Dr. Janann Colonel aware that Vicki Wilson is not working for her sleep issues. Patient went back to using Ambien CR 12.5 mg and OTC melatonin 10 mg at bedtime for sleep. Please call to advise.

## 2014-01-11 NOTE — Telephone Encounter (Signed)
Please let her know I am trying to get her into see a sleep doctor for a 2nd opinion. Thanks.

## 2014-01-11 NOTE — Telephone Encounter (Signed)
Pt calling to make Dr. Janann Colonel aware that the medication Belsomra is not working for her and that she started taking Ambien CR 12.5 mg again and OTC melatonin 10 mg at bedtime. Called pt back, no answer, left message for pt to call back. Please advise

## 2014-01-11 NOTE — Telephone Encounter (Signed)
Patient is returning a call. °

## 2014-01-11 NOTE — Telephone Encounter (Signed)
Called pt and left message informing her per Dr. Janann Colonel that the doctor was trying to get pt in to see a sleep doctor for a 2nd opinion and as soon as this is done that someone will be calling the pt back to inform her of the details. I advised the pt that if she has any other problems, questions or concerns to call the office.

## 2014-01-25 ENCOUNTER — Other Ambulatory Visit: Payer: Self-pay | Admitting: Neurology

## 2014-01-25 ENCOUNTER — Telehealth: Payer: Self-pay | Admitting: Neurology

## 2014-01-25 MED ORDER — ZOLPIDEM TARTRATE ER 12.5 MG PO TBCR: 12.5000 mg | EXTENDED_RELEASE_TABLET | Freq: Every evening | ORAL | Status: DC | PRN

## 2014-01-25 MED ORDER — METHYLPREDNISOLONE (PAK) 4 MG PO TABS: ORAL_TABLET | ORAL | Status: DC

## 2014-01-25 MED ORDER — RIBOFLAVIN 400 MG PO TABS: 400.0000 mg | ORAL_TABLET | Freq: Every day | ORAL | Status: DC

## 2014-01-25 NOTE — Telephone Encounter (Signed)
I called the patient back.  Relayed Dr Hazle Quant message.  She verbalized understanding and will cal [place] back if needed.

## 2014-01-25 NOTE — Telephone Encounter (Signed)
I called back and spoke with the patient at great length.  She would like [place] to prescribe Ambien CR 12.5mg  and would also like advise regarding preventive medication.  Says the Gabapentin caused dry mouth and dizziness, so we asked her to stop this med.  She had some left over, so she has been taking it once to twice daily along with Tylenol, but her migraine has yet to subside (since Saturday).  She was advised by other MD not to take NSAIDS due to acid reflux/GI issues.  I went over her med list and she said she is currently taking Effexor and Propranolol as preventive medication, and as well, she takes Flexeril PRN.  States she never started the Herricks we prescribed.  She would like to know what Dr Janann Colonel suggests regarding migraines.  Please advise.  Thank you.

## 2014-01-25 NOTE — Telephone Encounter (Signed)
Refill sent for the Ambien. Please let her know I also sent in prescriptions riboflavin 400mg  daily for prevention and then a medrol dose pack to help break the headache. If this does not give her relief I will talk with her about a referral to a headache clinic for further options. Thanks.

## 2014-01-25 NOTE — Telephone Encounter (Signed)
Patient called states she has been having frequent migraines since Sat, went to work this morning had to come back home due to the migraine so bad. Patient is taking 100 mg gabapentin and 1000 mg of tylenol. Pt needs a refill on her Ambien CR 12.5 mg at bedtime. I don't see a listing for the gabapentin or Ambien, but do see where she has requested before. Pt would like to see if he can call something that is going to break it. Pt would like for someone to please call her back concerning this matter. Thanks

## 2014-01-26 ENCOUNTER — Other Ambulatory Visit: Payer: Self-pay

## 2014-01-26 MED ORDER — PROPRANOLOL HCL 20 MG PO TABS: 20.0000 mg | ORAL_TABLET | Freq: Two times a day (BID) | ORAL | Status: DC

## 2014-01-26 NOTE — Telephone Encounter (Signed)
Last OV note says:  Currently taking Inderal 20mg  BID for headache prevention Will continue inderal for headache prophylaxis

## 2014-02-17 ENCOUNTER — Other Ambulatory Visit: Payer: Self-pay | Admitting: Gastroenterology

## 2014-02-17 DIAGNOSIS — R1084 Generalized abdominal pain: Secondary | ICD-10-CM

## 2014-02-18 ENCOUNTER — Encounter (HOSPITAL_BASED_OUTPATIENT_CLINIC_OR_DEPARTMENT_OTHER): Payer: Self-pay | Admitting: Emergency Medicine

## 2014-02-18 ENCOUNTER — Emergency Department (HOSPITAL_BASED_OUTPATIENT_CLINIC_OR_DEPARTMENT_OTHER): Payer: BC Managed Care – PPO

## 2014-02-18 ENCOUNTER — Emergency Department (HOSPITAL_BASED_OUTPATIENT_CLINIC_OR_DEPARTMENT_OTHER)
Admission: EM | Admit: 2014-02-18 | Discharge: 2014-02-19 | Disposition: A | Discharge status: Home / Self Care | Payer: BC Managed Care – PPO | Attending: Emergency Medicine | Admitting: Emergency Medicine

## 2014-02-18 DIAGNOSIS — F3289 Other specified depressive episodes: Secondary | ICD-10-CM | POA: Insufficient documentation

## 2014-02-18 DIAGNOSIS — F329 Major depressive disorder, single episode, unspecified: Secondary | ICD-10-CM | POA: Insufficient documentation

## 2014-02-18 DIAGNOSIS — G43909 Migraine, unspecified, not intractable, without status migrainosus: Secondary | ICD-10-CM | POA: Insufficient documentation

## 2014-02-18 DIAGNOSIS — R079 Chest pain, unspecified: Secondary | ICD-10-CM | POA: Insufficient documentation

## 2014-02-18 DIAGNOSIS — K219 Gastro-esophageal reflux disease without esophagitis: Secondary | ICD-10-CM | POA: Insufficient documentation

## 2014-02-18 DIAGNOSIS — Z8673 Personal history of transient ischemic attack (TIA), and cerebral infarction without residual deficits: Secondary | ICD-10-CM | POA: Insufficient documentation

## 2014-02-18 DIAGNOSIS — Z79899 Other long term (current) drug therapy: Secondary | ICD-10-CM | POA: Insufficient documentation

## 2014-02-18 DIAGNOSIS — Q211 Atrial septal defect: Secondary | ICD-10-CM | POA: Insufficient documentation

## 2014-02-18 DIAGNOSIS — F411 Generalized anxiety disorder: Secondary | ICD-10-CM | POA: Insufficient documentation

## 2014-02-18 DIAGNOSIS — Q2111 Secundum atrial septal defect: Secondary | ICD-10-CM | POA: Insufficient documentation

## 2014-02-18 DIAGNOSIS — IMO0002 Reserved for concepts with insufficient information to code with codable children: Secondary | ICD-10-CM | POA: Insufficient documentation

## 2014-02-18 LAB — CBC WITH DIFFERENTIAL/PLATELET
Basophils Absolute: 0 10*3/uL (ref 0.0–0.1)
Basophils Relative: 0 % (ref 0–1)
Eosinophils Absolute: 0.2 10*3/uL (ref 0.0–0.7)
Eosinophils Relative: 5 % (ref 0–5)
HCT: 36.4 % (ref 36.0–46.0)
HEMOGLOBIN: 12.4 g/dL (ref 12.0–15.0)
Lymphocytes Relative: 45 % (ref 12–46)
Lymphs Abs: 2 10*3/uL (ref 0.7–4.0)
MCH: 31.2 pg (ref 26.0–34.0)
MCHC: 34.1 g/dL (ref 30.0–36.0)
MCV: 91.5 fL (ref 78.0–100.0)
MONO ABS: 0.3 10*3/uL (ref 0.1–1.0)
MONOS PCT: 7 % (ref 3–12)
NEUTROS ABS: 1.9 10*3/uL (ref 1.7–7.7)
NEUTROS PCT: 43 % (ref 43–77)
Platelets: 221 10*3/uL (ref 150–400)
RBC: 3.98 MIL/uL (ref 3.87–5.11)
RDW: 11.7 % (ref 11.5–15.5)
WBC: 4.5 10*3/uL (ref 4.0–10.5)

## 2014-02-18 LAB — COMPREHENSIVE METABOLIC PANEL
ALK PHOS: 68 U/L (ref 39–117)
ALT: 24 U/L (ref 0–35)
ANION GAP: 14 (ref 5–15)
AST: 22 U/L (ref 0–37)
Albumin: 4.2 g/dL (ref 3.5–5.2)
BUN: 25 mg/dL — AB (ref 6–23)
CHLORIDE: 102 meq/L (ref 96–112)
CO2: 26 meq/L (ref 19–32)
Calcium: 9.8 mg/dL (ref 8.4–10.5)
Creatinine, Ser: 0.8 mg/dL (ref 0.50–1.10)
GFR calc Af Amer: >90 mL/min (ref >90)
GFR calc non Af Amer: 85 mL/min — ABNORMAL LOW (ref >90)
Glucose, Bld: 95 mg/dL (ref 70–99)
POTASSIUM: 3.9 meq/L (ref 3.7–5.3)
Sodium: 142 mEq/L (ref 137–147)
Total Protein: 7.1 g/dL (ref 6.0–8.3)

## 2014-02-18 LAB — LIPASE, BLOOD: Lipase: 24 U/L (ref 11–59)

## 2014-02-18 LAB — D-DIMER, QUANTITATIVE: D-Dimer, Quant: <0.27 ug/mL-FEU (ref 0.00–0.48)

## 2014-02-18 LAB — TROPONIN I

## 2014-02-18 NOTE — ED Provider Notes (Signed)
CSN: [id number]     Arrival date & time 02/18/14  1944 History  This chart was scribed for Babette Relic, MD by Steva Colder, ED Scribe. The patient was seen in room MH07/MH07 at 9:16 PM.      Chief Complaint  Patient presents with  . Chest Pain     The history is provided by the patient. No language interpreter was used.   HPI Comments: Vicki Wilson is a 53 y.o. female who presents to the Emergency Department complaining of non-radiating CP onset 2 weeks. She states that today her CP was constant. She states that for the last couple of weeks she has have a few episodes of sharp, stabbing, and well localized CP unlike her usual symptoms. She states that her CP will normally last from minutes to hours. She states that exertion does not bring on her symptoms. She states that she saw her GI doctor yesterday and forgot to tell him about the chest discomfort that she contributed to her reflux.   She states that she has had acid reflux for years. She states that now for the last four months it has become difficult to control. She states that at baseline she has burning, gas, and reflux. She states that she is generally healthy.   She states that she does not think that she has had a Heart attack. She denies nausea, vomiting, and SOB. 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  She denies recent travel, an operation, estrogen use, or CA.   Past Medical History  Diagnosis Date  . Stroke   . Headache(784.0)   . Depression   . Anxiety   . Migraines   . Uterine fibroids affecting pregnancy   . IBS (irritable bowel syndrome)   . GERD (gastroesophageal reflux disease)   . PFO (patent foramen ovale)   . Abnormal Pap smear     ASCUS-08/1998  LGSIL/HPV- 01/1999   Past Surgical History  Procedure Laterality Date  . Tonsillectomy    . Bilateral inferior tubinate reduction  1998  . Epicondylitis Right 05/2009    with ulnar nerve decompression    Family History  Problem Relation Age of  Onset  . High blood pressure Mother   . Hyperlipidemia Mother   . Gout Father   . High blood pressure Father   . Diabetes Mellitus I Sister   . Other Sister     seizure disorder  . Alzheimer's disease Maternal Grandmother   . Cancer Paternal Grandmother     colon  . Parkinson's disease Maternal Grandfather    History  Substance Use Topics  . Smoking status: Never Smoker   . Smokeless tobacco: Never Used  . Alcohol Use: Yes     Comment: 5 oz of white wine every 3-6 months    OB History   Grav Para Term Preterm Abortions TAB SAB Ect Mult Living   0 0 0 0 0 0 0 0 0 0      Review of Systems  Respiratory: Negative for shortness of breath.   Cardiovascular: Positive for chest pain.  Gastrointestinal: Negative for nausea and vomiting.     10 Systems reviewed and are negative for acute change except as noted in the HPI.  Allergies  Erythromycin  Home Medications   Prior to Admission medications   Medication Sig Start Date End Date Taking? Authorizing Provider  CARAFATE 1 GM/10ML suspension Take 1 g by mouth as needed.  12/04/12  Yes Historical Provider, MD  propranolol (INDERAL) 20 MG tablet Take 1 tablet (20 mg total) by mouth 2 (two) times daily. 01/26/14  Yes Hulen Luster, DO  Riboflavin 400 MG TABS Take 400 mg by mouth daily. 01/25/14  Yes Peter Marisa Hua, DO  Suvorexant (BELSOMRA) 10 MG TABS Take 10 mg by mouth at bedtime. 12/21/13  Yes Hulen Luster, DO  venlafaxine XR (EFFEXOR-XR) 37.5 MG 24 hr capsule take 1 capsule by mouth once daily for 2 weeks, then take 2 capsules by mouth once daily.   Yes Hulen Luster, DO  zolpidem (AMBIEN CR) 12.5 MG CR tablet Take 1 tablet (12.5 mg total) by mouth at bedtime as needed for sleep. 01/25/14  Yes Hulen Luster, DO  cyclobenzaprine (FLEXERIL) 5 MG tablet Take 5 mg by mouth as needed.     Historical Provider, MD  fluticasone (FLONASE) 50 MCG/ACT nasal spray Place 2 sprays into the nose daily as needed.      Historical Provider, MD  ibuprofen (ADVIL,MOTRIN) 800 MG tablet Take 800 mg by mouth every 8 (eight) hours as needed.    Historical Provider, MD  levETIRAcetam (KEPPRA) 500 MG tablet Take 1 tablet (500 mg total) by mouth every 12 (twelve) hours. 10/12/13   Hulen Luster, DO  Melatonin 10 MG TABS Take 10 tablets by mouth daily. Taking 1/2 - 1 tab QHS  prn    Historical Provider, MD  methylPREDNIsolone (MEDROL DOSPACK) 4 MG tablet follow package directions 01/25/14   Hulen Luster, DO  Multiple Vitamins-Iron (MULTIVITAMINS WITH IRON) TABS Take 1 tablet by mouth daily.    Historical Provider, MD  omeprazole-sodium bicarbonate (ZEGERID) 40-1100 MG per capsule Take 1 capsule by mouth 2 (two) times daily.     Historical Provider, MD  triamcinolone cream (KENALOG) 0.1 %  10/23/13   Historical Provider, MD   BP 150/81  Pulse 56  Temp(Src) 98.3 F (36.8 C) (Oral)  Resp 18  Ht 5\' 3"  (1.6 m)  Wt 149 lb (67.586 kg)  BMI 26.40 kg/m2  SpO2 100%  Physical Exam  Nursing note and vitals reviewed. Constitutional:  Awake, alert, nontoxic appearance.  HENT:  Head: Atraumatic.  Eyes: Right eye exhibits no discharge. Left eye exhibits no discharge.  Neck: Neck supple.  Cardiovascular: Normal rate and regular rhythm.   No murmur heard. Pulmonary/Chest: Effort normal. No respiratory distress. She has no wheezes. She has no rales. She exhibits no tenderness.  Abdominal: Soft. Bowel sounds are normal. There is no tenderness. There is no rebound.  Musculoskeletal: She exhibits no edema and no tenderness.  Baseline ROM, no obvious new focal weakness.  Neurological:  Mental status and motor strength appears baseline for patient and situation.  Skin: No rash noted.  Psychiatric: She has a normal mood and affect.    ED Course  Procedures (including critical care time) DIAGNOSTIC STUDIES: Oxygen Saturation is 100% on room air, normal by my interpretation.    COORDINATION OF CARE: 9:27  PM-Discussed treatment plan which includes EKG, CXR, and labs  with pt at bedside and pt agreed to plan.  Patient / Family / Caregiver informed of clinical course, understand medical decision-making process, and agree with plan. Labs Review Labs Reviewed  COMPREHENSIVE METABOLIC PANEL - Abnormal; Notable for the following:    BUN 25 (*)    Total Bilirubin <0.2 (*)    GFR calc non Af Amer 85 (*)    All other components within normal limits  TROPONIN I  CBC WITH DIFFERENTIAL  LIPASE, BLOOD  D-DIMER, QUANTITATIVE  TROPONIN I    Imaging Review Dg Chest 2 View  02/18/2014   CLINICAL DATA:  Chest pain  EXAM: CHEST  2 VIEW  COMPARISON:  None.  FINDINGS: The heart size and mediastinal contours are within normal limits. Both lungs are clear. The visualized skeletal structures are unremarkable.  IMPRESSION: No active cardiopulmonary disease.   Electronically Signed   By: Lajean Manes M.D.   On: 02/18/2014 21:51     EKG Interpretation   Date/Time:  Thursday February 18 2014 20:03:16 EDT Ventricular Rate:  53 PR Interval:  182 QRS Duration: 66 QT Interval:  442 QTC Calculation: 414 R Axis:   12 Text Interpretation:  Sinus bradycardia Low voltage QRS Cannot rule out  Anterior infarct , age undetermined No previous ECGs available Confirmed  by [facility]  MD, Jenny Reichmann ([id number]) on 02/18/2014 9:15:17 PM      MDM   Final diagnoses:  Chest pain, unspecified chest pain type    I doubt any other EMC precluding discharge at this time including, but not necessarily limited to the following:PE, AMI.  I personally performed the services described in this documentation, which was scribed in my presence. The recorded information has been reviewed and is accurate.    Babette Relic, MD 02/19/14 [phone]

## 2014-02-18 NOTE — ED Notes (Signed)
Pt with chest pain for last 2 weeks, today pain was constant, no n/v, no sob or radiation of pain, pt saw gi md yesterday but forgot to tell him about the chest discomfort she was contributing to her reflux

## 2014-02-18 NOTE — ED Notes (Signed)
MD at bedside. 

## 2014-02-18 NOTE — ED Notes (Signed)
Patient transported to X-ray 

## 2014-02-19 LAB — TROPONIN I: Troponin I: <0.3 ng/mL (ref <0.30)

## 2014-02-19 NOTE — Discharge Instructions (Signed)

## 2014-02-22 ENCOUNTER — Ambulatory Visit
Admission: RE | Admit: 2014-02-22 | Discharge: 2014-02-22 | Disposition: A | Discharge status: Home / Self Care | Payer: BC Managed Care – PPO | Source: Ambulatory Visit | Attending: Gastroenterology | Admitting: Gastroenterology

## 2014-02-22 DIAGNOSIS — R1084 Generalized abdominal pain: Secondary | ICD-10-CM

## 2014-02-22 MED ORDER — IOHEXOL 300 MG/ML  SOLN
100.0000 mL | Freq: Once | INTRAMUSCULAR | Status: AC | PRN
  Administered 2014-02-22: 100 mL via INTRAVENOUS

## 2014-02-25 ENCOUNTER — Other Ambulatory Visit: Payer: Self-pay | Admitting: Neurology

## 2014-02-26 NOTE — Telephone Encounter (Signed)
Rx signed and faxed.

## 2014-03-18 ENCOUNTER — Telehealth: Payer: Self-pay | Admitting: Neurology

## 2014-03-18 NOTE — Telephone Encounter (Addendum)
Contacted patient for more information and she said that a referral was placed for second opinion (see phone note from 01/11/14) to [facility],  said that Dr Janann Colonel had discussed with both Dr Brett Fairy and Dr Rexene Alberts and they felt that they could not help patient, so she wanted to go outside of [place] for study, had mentioned this to Dr Janann Colonel but no order was placed in epic. The patient does not have a preference of sleep center.

## 2014-03-18 NOTE — Telephone Encounter (Signed)
Will defer mgmt to Dr. Janann Colonel after he returns to clinic. -VRP

## 2014-03-18 NOTE — Telephone Encounter (Signed)
States she was suppose to have a referral to a sleep specialist outside of [place] because she has already seen them. But she has never heard anything please call. She states that no matter what time she goes to bed but it is usually 11:00 pm -12:00am and she still wakes up at 2:00 am. She states that she thinks lack of sleep is triggering her migraines and she is taking the ambien CR 12.5 mg at bed time as well and 10mg  of melatonin and she is still waking up.

## 2014-03-22 ENCOUNTER — Other Ambulatory Visit: Payer: Self-pay | Admitting: Neurology

## 2014-03-22 DIAGNOSIS — G4700 Insomnia, unspecified: Secondary | ICD-10-CM

## 2014-04-13 ENCOUNTER — Ambulatory Visit (HOSPITAL_BASED_OUTPATIENT_CLINIC_OR_DEPARTMENT_OTHER): Payer: BC Managed Care – PPO | Attending: Neurology

## 2014-04-13 VITALS — Ht 63.5 in | Wt 148.0 lb

## 2014-04-13 DIAGNOSIS — G4710 Hypersomnia, unspecified: Secondary | ICD-10-CM | POA: Insufficient documentation

## 2014-04-13 DIAGNOSIS — G4733 Obstructive sleep apnea (adult) (pediatric): Secondary | ICD-10-CM | POA: Diagnosis not present

## 2014-04-13 DIAGNOSIS — R5381 Other malaise: Secondary | ICD-10-CM | POA: Insufficient documentation

## 2014-04-13 DIAGNOSIS — R0683 Snoring: Secondary | ICD-10-CM

## 2014-04-13 DIAGNOSIS — R5383 Other fatigue: Secondary | ICD-10-CM

## 2014-04-13 DIAGNOSIS — G4719 Other hypersomnia: Secondary | ICD-10-CM

## 2014-04-16 ENCOUNTER — Ambulatory Visit: Payer: BC Managed Care – PPO | Admitting: Neurology

## 2014-04-16 ENCOUNTER — Other Ambulatory Visit (HOSPITAL_COMMUNITY): Payer: Self-pay | Admitting: Gastroenterology

## 2014-04-16 DIAGNOSIS — K219 Gastro-esophageal reflux disease without esophagitis: Secondary | ICD-10-CM

## 2014-04-16 DIAGNOSIS — K589 Irritable bowel syndrome without diarrhea: Secondary | ICD-10-CM

## 2014-04-16 DIAGNOSIS — R109 Unspecified abdominal pain: Secondary | ICD-10-CM

## 2014-04-17 DIAGNOSIS — G4733 Obstructive sleep apnea (adult) (pediatric): Secondary | ICD-10-CM

## 2014-04-17 NOTE — Sleep Study (Signed)
   NAME: Vicki Wilson DATE OF BIRTH:  02-26-1961 MEDICAL RECORD NUMBER [id number]  LOCATION: [facility]  PHYSICIAN: Neilani Duffee D  DATE OF STUDY: 04/13/2014  SLEEP STUDY TYPE: Nocturnal Polysomnogram               REFERRING PHYSICIAN: Hulen Luster, DO  INDICATION FOR STUDY: Insomnia with sleep apnea  EPWORTH SLEEPINESS SCORE:   11/24 HEIGHT: 5' 3.5" (161.3 cm)  WEIGHT: 148 lb (67.132 kg)    Body mass index is 25.8 kg/(m^2).  NECK SIZE: 13.5 in.  MEDICATIONS: Charted for review  SLEEP ARCHITECTURE: Total sleep time 309.5 minutes with sleep efficiency 81.4%. Stage I was 6.6%, stage II 73.3%, stage III 0.2%, REM 19.9% of total sleep time. Sleep latency 52 minutes, REM latency 158 minutes, awake after sleep onset 19 minutes, arousal index 26, bedtime medication: Melatonin, Ambien CR 12.5 mg  RESPIRATORY DATA: Apnea hypopneas index (AHI) 5.0 per hour. 26 total events scored including one obstructive apnea and 25 hypopneas. Events were not positional. REM AHI 20.5 per hour. This was a diagnostic polysomnogram and CPAP titration was not done.  OXYGEN DATA: Moderate snoring with oxygen desaturation to a nadir of 86% and mean saturation 94.9% on room air.  CARDIAC DATA: Normal sinus rhythm  MOVEMENT/PARASOMNIA: No significant motor disturbance, bathroom x1  IMPRESSION/ RECOMMENDATION:   1) Sleep onset was delayed until shortly before midnight, despite melatonin plus Ambien as noted above. Sustained intervals of stage II sleep reflecting Ambien. 2) Sleep disordered breathing at upper limits of normal, AHI 5 per hour. The normal range for adults is an AHI from 0-5 events per hour. Moderate snoring with oxygen desaturation to a nadir of 86% and mean saturation 94.9% on room air. Deneise Lever Diplomate, American Board of Sleep Medicine  ELECTRONICALLY SIGNED ON:  04/17/2014, 12:48 PM [place] PH: [phone]   FX: [phone] [place]

## 2014-04-18 ENCOUNTER — Other Ambulatory Visit: Payer: Self-pay | Admitting: Neurology

## 2014-04-25 ENCOUNTER — Other Ambulatory Visit: Payer: Self-pay | Admitting: Neurology

## 2014-04-26 ENCOUNTER — Other Ambulatory Visit: Payer: Self-pay | Admitting: Neurology

## 2014-05-03 ENCOUNTER — Ambulatory Visit (INDEPENDENT_AMBULATORY_CARE_PROVIDER_SITE_OTHER): Payer: BC Managed Care – PPO | Admitting: Neurology

## 2014-05-03 ENCOUNTER — Encounter: Payer: Self-pay | Admitting: Neurology

## 2014-05-03 ENCOUNTER — Encounter (INDEPENDENT_AMBULATORY_CARE_PROVIDER_SITE_OTHER): Payer: Self-pay

## 2014-05-03 VITALS — BP 134/78 | HR 77 | Ht 64.0 in | Wt 151.0 lb

## 2014-05-03 DIAGNOSIS — IMO0002 Reserved for concepts with insufficient information to code with codable children: Secondary | ICD-10-CM

## 2014-05-03 DIAGNOSIS — G4700 Insomnia, unspecified: Secondary | ICD-10-CM

## 2014-05-03 DIAGNOSIS — G43709 Chronic migraine without aura, not intractable, without status migrainosus: Secondary | ICD-10-CM

## 2014-05-03 MED ORDER — CYCLOBENZAPRINE HCL 5 MG PO TABS: 5.0000 mg | ORAL_TABLET | Freq: Three times a day (TID) | ORAL | Status: DC | PRN

## 2014-05-03 MED ORDER — SUVOREXANT 10 MG PO TABS: 10.0000 mg | ORAL_TABLET | Freq: Every day | ORAL | Status: DC

## 2014-05-03 MED ORDER — GABAPENTIN 100 MG PO CAPS: 100.0000 mg | ORAL_CAPSULE | Freq: Two times a day (BID) | ORAL | Status: DC

## 2014-05-03 NOTE — Progress Notes (Signed)
Provider:  Dr Janann Colonel Referring Provider: Shirline Frees, MD Primary Care Physician:  Shirline Frees, MD  CC:  headaches  HPI:  Vicki Wilson is a 53 y.o. female here with last visit being 12/2013.   Continues to have difficulty with sleep. Has difficulty falling asleep and staying asleep, typically is getting around 4 hours of sleep a night. Currently is taking the Ambien CR and Melatonin nightly. Has tried Belsomra in the past but did not notice any benefit. Has had a sleep study completed since last visit which was unremarkable.   Continues to have difficulty with headaches. Typically will have a headache around 50% of the days of the month. Currently taking Inderal 20mg  BID, riboflavin 400 and Magnesium 400mg . Similar headaches to prior visit.   Otherwise no acute symptoms. Remains stressed due to work and lack of sleep.   Has tried Ambien, Ambien CR, Lunesta, Sonata, Pamelor, Trazodone, Elavil. Currently taking Ambien CR and Melatonin 10mg  nightly. Has not gotten much benefit from this regimen.   Per old records  with Dr Rexene Alberts and Dr Erling Cruz: In summary, Vicki Wilson is a very pleasant 53 y.o.-year old female previously followed by Dr. Erling Cruz with a history of migraine without aura. She has been tried on multiple different abortive medications and has tried preventative medications in the form of imipramine and Topamax as well as Effexor or in the past and is currently on low-dose Effexor and low-dose propranolol. I would like to suggest a cautious increase in her propranolol to 10 mg 3 times a day. On 40 mg daily she had diarrhea in the past. We will have to see how it goes. I had a long chat with the patient regarding her symptoms. Her exam is nonfocal and I reassured her in that regard. She is advised to continue using Flexeril and over-the-counter anti-inflammatory as needed for abortive treatment. She had side effects with several triptans in the past. She is encouraged to followup  with her primary care physician for her chronic insomnia. I did give her a prescription for propranolol 10 mg 3 times a day with refills. I suggested a six-month followup with one of my colleagues for her chronic migraines. I answered all her questions today and she is encouraged to call with any interim problems. She was in agreement.  Concerns/Questions:Review of Systems: Out of a complete 14 system review, the patient complains of only the following symptoms, and all other reviewed systems are negative. Other for fatigue headache passing out insomnia not enough sleep  History   Social History  . Marital Status: Single    Spouse Name: N/A    Number of Children: 0  . Years of Education: 2 Masters   Occupational History  . PHYSICIAN ASSISTANT    Social History Main Topics  . Smoking status: Never Smoker   . Smokeless tobacco: Never Used  . Alcohol Use: Yes     Comment: 5 oz of white wine every 3-6 months   . Drug Use: No  . Sexual Activity: No   Other Topics Concern  . Not on file   Social History Narrative   Patient is single no children, right handed,    Education level is 2 Master's degree   Caffeine consumption is 0    Family History  Problem Relation Age of Onset  . High blood pressure Mother   . Hyperlipidemia Mother   . Gout Father   . High blood pressure Father   . Diabetes Mellitus  I Sister   . Other Sister     seizure disorder  . Alzheimer's disease Maternal Grandmother   . Cancer Paternal Grandmother     colon  . Parkinson's disease Maternal Grandfather     Past Medical History  Diagnosis Date  . Stroke   . Headache(784.0)   . Depression   . Anxiety   . Migraines   . Uterine fibroids affecting pregnancy   . IBS (irritable bowel syndrome)   . GERD (gastroesophageal reflux disease)   . PFO (patent foramen ovale)   . Abnormal Pap smear     ASCUS-08/1998  LGSIL/HPV- 01/1999    Past Surgical History  Procedure Laterality Date  . Tonsillectomy      . Bilateral inferior tubinate reduction  1998  . 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 CARAFATE 1 GM/10ML suspension Take 1 g by mouth as needed.       . cyclobenzaprine (FLEXERIL) 5 MG tablet Take 5 mg by mouth as needed.       . fluticasone (FLONASE) 50 MCG/ACT nasal spray Place 2 sprays into the nose daily as needed.       Marland Kitchen ibuprofen (ADVIL,MOTRIN) 800 MG tablet Take 800 mg by mouth every 8 (eight) hours as needed.      . Melatonin 10 MG TABS Take 10 tablets by mouth daily. Taking 1/2 - 1 tab QHS  prn      . Multiple Vitamins-Iron (MULTIVITAMINS WITH IRON) TABS Take 1 tablet by mouth daily.      Marland Kitchen omeprazole-sodium bicarbonate (ZEGERID) 40-1100 MG per capsule Take 1 capsule by mouth 2 (two) times daily.       . propranolol (INDERAL) 20 MG tablet TAKE ONE TABLET BY MOUTH TWICE DAILY   180 tablet  1  . Riboflavin 400 MG TABS Take 400 mg by mouth daily.  30 tablet  3  . triamcinolone cream (KENALOG) 0.1 % Apply 1 application topically 2 (two) times daily as needed.       . venlafaxine XR (EFFEXOR-XR) 37.5 MG 24 hr capsule take 1 capsule by mouth once daily for 2 weeks, then take 2 capsules by mouth once daily.  60 capsule  6  . XIFAXAN 550 MG TABS tablet       . zolpidem (AMBIEN CR) 12.5 MG CR tablet TAKE ONE TABLET BY MOUTH AT BEDTIME AS NEEDED   30 tablet  1   No current facility-administered medications for this visit.    Allergies as of 05/03/2014 - Review Complete 05/03/2014  Allergen Reaction Noted  . Erythromycin  11/11/2011    Vitals: BP 134/78  Pulse 77  Ht 5\' 4"  (1.626 m)  Wt 151 lb (68.493 kg)  BMI 25.91 kg/m2 Last Weight:  Wt Readings from Last 1 Encounters:  05/03/14 151 lb (68.493 kg)   Last Height:   Ht Readings from Last 1 Encounters:  05/03/14 5\' 4"  (1.626 m)     Physical exam: Exam: Gen: NAD, conversant Eyes: anicteric sclerae, moist conjunctivae HENT:  Atraumatic, oropharynx clear Lungs: CTA, no wheezing, rales, rhonic                          CV: RRR, no MRG Abdomen: Soft, non-tender;  Extremities: No peripheral edema  Skin: Normal temperature, no rash,  Psych: Appropriate affect, pleasant  Neuro: MS: AA&Ox3, appropriately interactive, normal affect  Speech: fluent w/o paraphasic error  Memory: good recent and remote recall  CN: PERRL, EOMI no nystagmus, no ptosis, unable to visualize optic disc bilat, VFF to FC bilat, sensation intact to LT V1-V3 bilat, face symmetric, no weakness, hearing grossly intact, palate elevates symmetrically, shoulder shrug 5/5 bilat,  tongue protrudes midline, no fasiculations noted.  Motor: normal bulk and tone Strength: 5/5  In all extremities  Coord: rapid alternating and point-to-point (FNF, HTS) movements intact.  Reflexes: symmetrical, bilat downgoing toes  Sens: LT intact in all extremities  Gait: posture, stance, stride and arm-swing normal. .   Assessment:  After physical and neurologic examination, review of laboratory studies, imaging, neurophysiology testing and pre-existing records, assessment will be reviewed on the problem list.  Plan:  Treatment plan and additional workup will be reviewed under Problem List.  1)Migraine without aura 2)Insomnia  Ms Casasola is a pleasant 53 year old woman presenting for followup evaluation of chronic migraine headache. She has been tried on multiple agents with no notable therapeutic benefit. She has gone through 2 courses of Botox and is due for her 3rd cycle, due to cost is holding off at this time. Continues to have difficulty with insomnia which is likely the main trigger of her frequent headaches. Will continue inderal for headache prophylaxis, will d/c magnesium and riboflavin. Instead will start Petadolax 50mg  TID. Will d/c ambien CR and try Melatonin 10mg  and Belsomra 10mg  for sleep. Patient to follow up in 3 to 4 months.

## 2014-05-03 NOTE — Patient Instructions (Signed)
Overall you are doing fairly well but I do want to suggest a few things today:   Remember to drink plenty of fluid, eat healthy meals and do not skip any meals. Try to eat protein with a every meal and eat a healthy snack such as fruit or nuts in between meals. Try to keep a regular sleep-wake schedule and try to exercise daily, particularly in the form of walking, 20-30 minutes a day, if you can.   As far as your medications are concerned, I would like to suggest the following: 1) Please stop the Ambien CR 2) Please take Melatonin 10mg  1hr prior to going to bed. Then take Belsomra 10mg  20-30 minutes prior to going to bed 3)Please stop riboflavin and magnesium 4)Please start Petadolax one capsule three times a day  Please follow up with Dr Brett Fairy in 3 to 4 months. Please call us with any interim questions, concerns, problems, updates or refill requests.   My clinical assistant and will answer any of your questions and relay your messages to me and also relay most of my messages to you.   Our phone number is [phone]. We also have an after hours call service for urgent matters and there is a physician on-call for urgent questions. For any emergencies you know to call 911 or go to the nearest emergency room

## 2014-05-04 ENCOUNTER — Ambulatory Visit (HOSPITAL_COMMUNITY)
Admission: RE | Admit: 2014-05-04 | Discharge: 2014-05-04 | Disposition: A | Discharge status: Home / Self Care | Payer: BC Managed Care – PPO | Source: Ambulatory Visit | Attending: Gastroenterology | Admitting: Gastroenterology

## 2014-05-04 DIAGNOSIS — R109 Unspecified abdominal pain: Secondary | ICD-10-CM | POA: Diagnosis present

## 2014-05-04 DIAGNOSIS — R142 Eructation: Secondary | ICD-10-CM

## 2014-05-04 DIAGNOSIS — K589 Irritable bowel syndrome without diarrhea: Secondary | ICD-10-CM

## 2014-05-04 DIAGNOSIS — R141 Gas pain: Secondary | ICD-10-CM | POA: Insufficient documentation

## 2014-05-04 DIAGNOSIS — K219 Gastro-esophageal reflux disease without esophagitis: Secondary | ICD-10-CM

## 2014-05-04 DIAGNOSIS — R143 Flatulence: Secondary | ICD-10-CM

## 2014-05-04 MED ORDER — TECHNETIUM TC 99M SULFUR COLLOID
2.1000 | Freq: Once | INTRAVENOUS | Status: AC | PRN
  Administered 2014-05-04: 2.1 via INTRAVENOUS

## 2014-05-05 ENCOUNTER — Ambulatory Visit: Payer: BC Managed Care – PPO | Admitting: Neurology

## 2014-05-10 ENCOUNTER — Telehealth: Payer: Self-pay | Admitting: Neurology

## 2014-05-10 NOTE — Telephone Encounter (Signed)
Patient stated she's taking 10 mg of Melatonin 1 hour before bedtime and 10 mg Belsomra 20 minutes before bedtime.  Awakening every two hours and sometimes every hour.  Past two nights increased Belsomra 20 mg and still have same effect. Questioning if 20 mg Bellsomra should be continued and if so new Rx is needed.  Please call anytime and

## 2014-05-12 ENCOUNTER — Other Ambulatory Visit: Payer: Self-pay | Admitting: Neurology

## 2014-05-12 MED ORDER — SUVOREXANT 20 MG PO TABS: 20.0000 mg | ORAL_TABLET | Freq: Every day | ORAL | Status: DC

## 2014-05-12 NOTE — Telephone Encounter (Signed)
Patient calling again to check on the status of this message, please return call and advise.

## 2014-05-12 NOTE — Telephone Encounter (Signed)
Returned call. She will try Belsomra 20mg  for the next few days. If no improvement she will call back on Friday and schedule a follow up with Dr Brett Fairy.

## 2014-05-12 NOTE — Telephone Encounter (Signed)
Please have her increase the belsomra to 20mg . A new prescription was sent to her pharmacy.

## 2014-05-12 NOTE — Telephone Encounter (Signed)
I want her to keep it at 20. She needs to give the medication more than 2 days to see if there is any benefit from it.

## 2014-05-12 NOTE — Telephone Encounter (Signed)
Please respond to 9/28 phone note.

## 2014-05-12 NOTE — Telephone Encounter (Signed)
Per patient message, she did increase Belsomra to 20 mg for the past two nights with no change in sleep pattern. Please clarify directions.

## 2014-05-12 NOTE — Telephone Encounter (Signed)
Patient aware of directions. She is having a hard time functioning at work due to lack of sleep. She is really struggling and wanted touch base with you before you left.

## 2014-05-24 ENCOUNTER — Other Ambulatory Visit: Payer: Self-pay | Admitting: Obstetrics & Gynecology

## 2014-05-24 DIAGNOSIS — Z1231 Encounter for screening mammogram for malignant neoplasm of breast: Secondary | ICD-10-CM

## 2014-05-28 ENCOUNTER — Ambulatory Visit (HOSPITAL_BASED_OUTPATIENT_CLINIC_OR_DEPARTMENT_OTHER): Payer: BC Managed Care – PPO

## 2014-06-30 ENCOUNTER — Telehealth: Payer: Self-pay | Admitting: Neurology

## 2014-06-30 NOTE — Telephone Encounter (Signed)
This medication is not actually a Rx, it is a dietary supplement.  It is currently only available for purchase online ([email or web address].[email or web address]).  I called the patient back, got no answer.  Left message.

## 2014-06-30 NOTE — Telephone Encounter (Signed)
Patient requesting Rx for Petadolex 50 mg, 2 x's day for Migraines.  Dr. Janann Colonel had given samples and works really well for migraines.  Please call and advise

## 2014-07-01 ENCOUNTER — Ambulatory Visit: Payer: BC Managed Care – PPO | Admitting: Nurse Practitioner

## 2014-07-01 ENCOUNTER — Ambulatory Visit: Payer: BC Managed Care – PPO | Admitting: Certified Nurse Midwife

## 2014-07-02 ENCOUNTER — Ambulatory Visit (HOSPITAL_COMMUNITY)
Admission: RE | Admit: 2014-07-02 | Discharge: 2014-07-02 | Disposition: A | Discharge status: Home / Self Care | Payer: BC Managed Care – PPO | Source: Ambulatory Visit | Attending: Obstetrics & Gynecology | Admitting: Obstetrics & Gynecology

## 2014-07-02 ENCOUNTER — Ambulatory Visit (INDEPENDENT_AMBULATORY_CARE_PROVIDER_SITE_OTHER): Payer: BC Managed Care – PPO | Admitting: Nurse Practitioner

## 2014-07-02 ENCOUNTER — Encounter: Payer: Self-pay | Admitting: Nurse Practitioner

## 2014-07-02 VITALS — BP 122/76 | HR 72 | Ht 63.25 in | Wt 149.0 lb

## 2014-07-02 DIAGNOSIS — Z1231 Encounter for screening mammogram for malignant neoplasm of breast: Secondary | ICD-10-CM | POA: Insufficient documentation

## 2014-07-02 DIAGNOSIS — Z0000 Encounter for general adult medical examination without abnormal findings: Secondary | ICD-10-CM

## 2014-07-02 DIAGNOSIS — Z01419 Encounter for gynecological examination (general) (routine) without abnormal findings: Secondary | ICD-10-CM

## 2014-07-02 NOTE — Patient Instructions (Signed)

## 2014-07-02 NOTE — Progress Notes (Signed)
Patient ID: Vicki Wilson, female   DOB: Jun 07, 1961, 53 y.o.   MRN: [id number] 53 y.o. G0 Single African American Fe here for annual exam.  No further vaginal bleeding since PMB in January.  Last [facility] was 88.0.   Some increase vaso symptoms. Episode of chest pain this summer with negative EKG and labs.  Currently on Doxycycline for elevated Lyme titer.  She is not dating or SA.  A lot of work stress with her job.  Patient's last menstrual period was 08/28/2013 (exact date).          Sexually active: no  The current method of family planning is abstinence.  Exercising: no  Smoker: no  Health Maintenance: Pap: 06/27/12, WNL, neg HR HPV MMG: 07/02/14, no results at this time Colonoscopy: 06/01/13, polyps, repeat 5 years BMD: 2005, heel test TDaP: 2009 Labs:  PCP, Shirline Frees, MD   reports that she has never smoked. She has never used smokeless tobacco. She reports that she drinks alcohol. She reports that she does not use illicit drugs.  Past Medical History  Diagnosis Date  . Stroke   . Headache(784.0)   . Depression   . Anxiety   . Migraines   . Uterine fibroids affecting pregnancy   . IBS (irritable bowel syndrome)   . GERD (gastroesophageal reflux disease)   . PFO (patent foramen ovale)   . Abnormal Pap smear     ASCUS-08/1998  LGSIL/HPV- 01/1999    Past Surgical History  Procedure Laterality Date  . Tonsillectomy    . Bilateral inferior tubinate reduction  1998  . 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 acetaminophen (TYLENOL) 500 MG tablet Take 1,000 mg by mouth every 8 (eight) hours as needed.    Marland Kitchen CARAFATE 1 GM/10ML suspension Take 1 g by mouth as needed.     . cyclobenzaprine (FLEXERIL) 5 MG tablet Take 1 tablet (5 mg total) by mouth 3 (three) times daily as needed. 60 tablet 3  . doxycycline (VIBRAMYCIN) 100 MG capsule Take 1 capsule by mouth 2 (two) times  daily.  0  . fluticasone (FLONASE) 50 MCG/ACT nasal spray Place 2 sprays into the nose daily as needed.     . gabapentin (NEURONTIN) 100 MG capsule Take 1 capsule (100 mg total) by mouth 2 (two) times daily. (Patient taking differently: Take 100 mg by mouth as needed. ) 60 capsule   . LINZESS 290 MCG CAPS capsule Take 1 tablet by mouth daily.  0  . Melatonin 10 MG TABS Take 10 tablets by mouth daily. Taking 1/2 - 1 tab QHS  prn    . Multiple Vitamins-Iron (MULTIVITAMINS WITH IRON) TABS Take 1 tablet by mouth daily.    Marland Kitchen omeprazole-sodium bicarbonate (ZEGERID) 40-1100 MG per capsule Take 1 capsule by mouth 2 (two) times daily.     . Petasin (PETADOLEX PO) Take 50 mg by mouth 2 (two) times daily.    . propranolol (INDERAL) 20 MG tablet TAKE ONE TABLET BY MOUTH TWICE DAILY  180 tablet 1  . triamcinolone cream (KENALOG) 0.1 % Apply 1 application topically 2 (two) times daily as needed.     . venlafaxine XR (EFFEXOR-XR) 37.5 MG 24 hr capsule take 1 capsule by mouth once daily for 2 weeks, then take 2 capsules by mouth once daily. 60 capsule 6  . zolpidem (AMBIEN CR) 12.5 MG CR tablet Take 1 tablet by  mouth at bedtime.  0   No current facility-administered medications for this visit.    Family History  Problem Relation Age of Onset  . High blood pressure Mother   . Hyperlipidemia Mother   . Gout Father   . High blood pressure Father   . Diabetes Mellitus I Sister   . Other Sister     seizure disorder  . Alzheimer's disease Maternal Grandmother   . Cancer Paternal Grandmother     colon  . Parkinson's disease Maternal Grandfather     ROS:  Pertinent items are noted in HPI.  Otherwise, a comprehensive ROS was negative.  Exam:   BP 122/76 mmHg  Pulse 72  Ht 5' 3.25" (1.607 m)  Wt 149 lb (67.586 kg)  BMI 26.17 kg/m2  LMP 08/28/2013 (Exact Date) Height: 5' 3.25" (160.7 cm)  Ht Readings from Last 3 Encounters:  07/02/14 5' 3.25" (1.607 m)  05/03/14 5\' 4"  (1.626 m)  04/13/14 5' 3.5"  (1.613 m)    General appearance: alert, cooperative and appears stated age Head: Normocephalic, without obvious abnormality, atraumatic Neck: no adenopathy, supple, symmetrical, trachea midline and thyroid normal to inspection and palpation Lungs: clear to auscultation bilaterally Breasts: normal appearance, no masses or tenderness Heart: regular rate and rhythm Abdomen: soft, non-tender; no masses,  no organomegaly Extremities: extremities normal, atraumatic, no cyanosis or edema Skin: Skin color, texture, turgor normal. No rashes or lesions Lymph nodes: Cervical, supraclavicular, and axillary nodes normal. No abnormal inguinal nodes palpated Neurologic: Grossly normal   Pelvic: External genitalia:  no lesions              Urethra:  normal appearing urethra with no masses, tenderness or lesions              Bartholin's and Skene's: normal                 Vagina: normal appearing vagina with normal color and discharge, no lesions              Cervix: anteverted              Pap taken: No. Bimanual Exam:  Uterus:  normal size, contour, position, consistency, mobility, non-tender              Adnexa: no mass, fullness, tenderness               Rectovaginal: Confirms               Anus:  normal sphincter tone, no lesions  A:  Well Woman with normal exam  Menopausal with no VB since 08/28/13 - negative evaluation with PUS History of fibroids no size change with PUS 11/12/13 History of stroke in past with right lacunar infarct 08/2005  Remote history of remote ASCUS with negative HPV in 2003  Chronic insomnia  P:   Reviewed health and wellness pertinent to exam  Pap smear not taken today  Mammogram is due 11/16  Discussed other non hormonal treatment for vaso symptoms with herbal supplements  Counseled on breast self exam, mammography screening, adequate intake of calcium and vitamin D, diet and exercise, Kegel's exercises return annually or prn  An After  Visit Summary was printed and given to the patient.

## 2014-07-04 NOTE — Progress Notes (Signed)
Encounter reviewed by Dr. Marguerita Stapp Silva.  

## 2014-07-11 ENCOUNTER — Telehealth: Payer: Self-pay | Admitting: Neurology

## 2014-07-12 NOTE — Telephone Encounter (Signed)
Last OV note says: Will d/c ambien CR and try Melatonin 10mg  and Belsomra 10mg  for sleep. Patient to follow up in 3 to 4 months.  I called patient.  Got no answer.  Left message.

## 2014-07-13 ENCOUNTER — Telehealth: Payer: Self-pay | Admitting: Neurology

## 2014-07-13 ENCOUNTER — Other Ambulatory Visit: Payer: Self-pay

## 2014-07-13 MED ORDER — ZOLPIDEM TARTRATE ER 12.5 MG PO TBCR: 12.5000 mg | EXTENDED_RELEASE_TABLET | Freq: Every day | ORAL | Status: DC

## 2014-07-13 NOTE — Telephone Encounter (Signed)
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  Has an appt scheduled in Jan.  Dr Janann Colonel previously asked the patient to d/c ambien and take Belsomra.  Patient wishes to continue taking Ambien.  Request forwarded to provider for review.

## 2014-07-13 NOTE — Telephone Encounter (Signed)
Patient is returning a call. Patient states Dr. Janann Colonel had put patient on Ambien CR 12.5mg . [facility]. Patient states she has an appointment with Dr. Brett Fairy scheduled on 08-19-14.

## 2014-07-14 NOTE — Telephone Encounter (Signed)
I called the pharmacy who has the Rx, however, they have not filled it yet.  They will process this today and contact patient when it's ready for pick up.

## 2014-07-14 NOTE — Telephone Encounter (Signed)
Patient is calling because [facility] says the refill request for Ambien is not there. Could you please resend. Thank you.

## 2014-08-19 ENCOUNTER — Encounter: Payer: Self-pay | Admitting: Neurology

## 2014-08-19 ENCOUNTER — Ambulatory Visit (INDEPENDENT_AMBULATORY_CARE_PROVIDER_SITE_OTHER): Payer: BLUE CROSS/BLUE SHIELD | Admitting: Neurology

## 2014-08-19 VITALS — BP 122/73 | HR 72 | Resp 16 | Ht 63.25 in | Wt 150.0 lb

## 2014-08-19 DIAGNOSIS — G43101 Migraine with aura, not intractable, with status migrainosus: Secondary | ICD-10-CM

## 2014-08-19 DIAGNOSIS — F5105 Insomnia due to other mental disorder: Secondary | ICD-10-CM

## 2014-08-19 DIAGNOSIS — G4700 Insomnia, unspecified: Secondary | ICD-10-CM

## 2014-08-19 DIAGNOSIS — G43909 Migraine, unspecified, not intractable, without status migrainosus: Secondary | ICD-10-CM | POA: Insufficient documentation

## 2014-08-19 MED ORDER — QUETIAPINE FUMARATE 25 MG PO TABS: 25.0000 mg | ORAL_TABLET | Freq: Every day | ORAL | Status: DC

## 2014-08-19 NOTE — Progress Notes (Signed)
Provider:  Dr Janann Colonel Referring Provider: Shirline Frees, MD Primary Care Physician:  Shirline Frees, MD  CC:  Headaches, presumed to be migraines.  HPI:  Vicki Wilson is a 54 y.o. afro- [place] , right handed  female here alone,  last visit being 12/2013 with Dr. Janann Colonel and previously followed by Dr Rexene Alberts and Dr Erling Cruz.     Per old records with Dr Rexene Alberts and Dr Erling Cruz: In summary, Vicki Wilson is a very pleasant 54 y.o.-year old female previously followed by Dr. Erling Cruz with a history of migraine without aura. She has been tried on multiple different abortive medications and has tried preventative medications in the form of imipramine and Topamax as well as Effexor or in the past and is currently on low-dose Effexor and low-dose propranolol. I would like to suggest a cautious increase in her propranolol to 10 mg 3 times a day. On 40 mg daily she had diarrhea in the past. We will have to see how it goes. I had a long chat with the patient regarding her symptoms. Her exam is nonfocal and I reassured her in that regard. She is advised to continue using Flexeril and over-the-counter anti-inflammatory as needed for abortive treatment. She had side effects with several triptans in the past. She is encouraged to followup with her primary care physician for her chronic insomnia. I did give her a prescription for propranolol 10 mg 3 times a day with refills. I suggested a six-month followup with one of my colleagues for her chronic migraines. I answered all her questions today and she is encouraged to call with any interim problems. She was in agreement. This is her history : PS. D.O.  Continues to have difficulty with sleep. Has difficulty falling asleep and staying asleep, typically is getting around 4 hours of sleep a night. Currently is taking the Ambien CR and Melatonin nightly. Has tried Belsomra in the past but did not notice any benefit. Has had a sleep study completed since last visit which was  unremarkable.  Continues to have difficulty with headaches. Typically will have a headache around 50% of the days of the month. Currently taking Inderal 20mg  BID, riboflavin 400 and Magnesium 400mg . Similar headaches to prior visit.  Otherwise no acute symptoms. Remains stressed due to work and lack of sleep.  Has tried Ambien, Ambien CR, Lunesta, Sonata, Pamelor, Trazodone, Elavil. Currently taking Ambien CR and Melatonin 10mg  nightly. Has not gotten much benefit from this regimen.   Interval history :  08-19-13 , Mrs. Revels presents today for the first time to me in a face-to-face visit with a history of migraines and chronic insomnia that follows a cyclic  . Her migraines have much improved after she started Bascom Levels. Her insomnia however has not responded to multiple prescription agents in the past. She has suffered from insomnia for at least 21 years in a cyclic pattern and after melatonin and Belsomra the last 2 agents she tried failed in combination she is now returned to Ambien CR 12.5 mg at night but she also does take melatonin an hour prior to bedtime with this. She also takes when necessary Flexeril and takes Neurontin which should promote sleep as well. Her medication list has been reviewed prior to this visit.  Sleep habits the patient usually goes to bed between 11 and 11:30 PM, even sleep aids she may lie awake until 3 or 3:30 AM before she can first time initiate sleep. Once asleep she will stay  asleep for about 4-1/2 hours. She rarely has nocturia and usually does not drink liquids prior to bedtime. She does not report nocturnal palpitations or nightmares, she does not report nocturnal hot flashes also she may suffer some in daytime.  She drinks rarely caffeine. She does not nap in daytime except on week ends, She works 2 jobs .  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 She  has early morning wakefulness. She is not refreshed in AM ,she does rely on an  alarm.   Concerns/Questions:Review of Systems: Out of a complete 14 system review, the patient complains of only the following symptoms, and all other reviewed systems are negative. Other for fatigue,  Headache,  passing out,   Insomnia- problems to initiate Sleep , but can stay asleep for about 3-5 hours , not enough sleep  History   Social History  . Marital Status: Single    Spouse Name: N/A    Number of Children: 0  . Years of Education: 2 Masters   Occupational History  . PHYSICIAN ASSISTANT    Social History Main Topics  . Smoking status: Never Smoker   . Smokeless tobacco: Never Used  . Alcohol Use: Yes     Comment: 5 oz of white wine every 3-6 months   . Drug Use: No  . Sexual Activity: No   Other Topics Concern  . Not on file   Social History Narrative   Patient is single no children, right handed,    Education level is 2 Master's degree   Caffeine consumption is 0    Family History  Problem Relation Age of Onset  . High blood pressure Mother   . Hyperlipidemia Mother   . Gout Father   . High blood pressure Father   . Diabetes Mellitus I Sister   . Other Sister     seizure disorder  . Alzheimer's disease Maternal Grandmother   . Cancer Paternal Grandmother     colon  . Parkinson's disease Maternal Grandfather     Past Medical History  Diagnosis Date  . Stroke   . Headache(784.0)   . Depression   . Anxiety   . Migraines   . Uterine fibroids affecting pregnancy   . IBS (irritable bowel syndrome)   . GERD (gastroesophageal reflux disease)   . PFO (patent foramen ovale)   . Abnormal Pap smear     ASCUS-08/1998  LGSIL/HPV- 01/1999    Past Surgical History  Procedure Laterality Date  . Tonsillectomy    . Bilateral inferior tubinate reduction  1998  . Epicondylitis Right 05/2009    with ulnar nerve decompression     Current Outpatient Prescriptions  Medication Sig  Dispense Refill  . acetaminophen (TYLENOL) 500 MG tablet Take 1,000 mg by mouth every 8 (eight) hours as needed.    Marland Kitchen CARAFATE 1 GM/10ML suspension Take 1 g by mouth as needed.     . cyclobenzaprine (FLEXERIL) 5 MG tablet Take 1 tablet (5 mg total) by mouth 3 (three) times daily as needed. 60 tablet 3  . gabapentin (NEURONTIN) 100 MG capsule Take 1 capsule (100 mg total) by mouth 2 (two) times daily. (Patient taking differently: Take 100 mg by mouth as needed. ) 60 capsule   . LINZESS 290 MCG CAPS capsule Take 145 mcg by mouth daily.   0  . Melatonin 10 MG TABS Take 10 tablets  by mouth daily. Taking  1 tab QHS    . Multiple Vitamins-Iron (MULTIVITAMINS WITH IRON) TABS Take 1 tablet by mouth daily.    Marland Kitchen omeprazole-sodium bicarbonate (ZEGERID) 40-1100 MG per capsule Take 1 capsule by mouth 2 (two) times daily.     . Petasin (PETADOLEX PO) Take 50 mg by mouth 2 (two) times daily.    . propranolol (INDERAL) 20 MG tablet TAKE ONE TABLET BY MOUTH TWICE DAILY  180 tablet 1  . triamcinolone cream (KENALOG) 0.1 % Apply 1 application topically 2 (two) times daily as needed.     . venlafaxine XR (EFFEXOR-XR) 37.5 MG 24 hr capsule take 1 capsule by mouth once daily for 2 weeks, then take 2 capsules by mouth once daily. (Patient taking differently: take 1 capsule by mouth once daily for 2 weeks, then take 1 capsules by mouth once daily.) 60 capsule 6  . zolpidem (AMBIEN CR) 12.5 MG CR tablet Take 1 tablet (12.5 mg total) by mouth at bedtime. 30 tablet 1  . fluticasone (FLONASE) 50 MCG/ACT nasal spray Place 2 sprays into the nose daily as needed.     Marland Kitchen QUEtiapine (SEROQUEL) 25 MG tablet Take 1 tablet (25 mg total) by mouth at bedtime. 30 tablet 1   No current facility-administered medications for this visit.    Allergies as of 08/19/2014 - Review Complete 08/19/2014  Allergen Reaction Noted  . Erythromycin  11/11/2011    Vitals: BP 122/73 mmHg  Pulse 72  Resp 16  Ht 5' 3.25" (1.607 m)  Wt 150 lb  (68.04 kg)  BMI 26.35 kg/m2  LMP 08/28/2013 (Exact Date) Last Weight:  Wt Readings from Last 1 Encounters:  08/19/14 150 lb (68.04 kg)   Last Height:   Ht Readings from Last 1 Encounters:  08/19/14 5' 3.25" (1.607 m)     Physical exam: Exam: Gen: NAD, conversant Eyes: anicteric sclerae, moist conjunctivae HENT: Atraumatic, oropharynx clear Lungs: CTA, no wheezing, rales, rhonic                          CV: RRR, no MRG Abdomen: Soft, non-tender;  Extremities: No peripheral edema  Skin: Normal temperature, no rash,  Psych: Appropriate affect, pleasant  Neuro: MS: AA&Ox3, appropriately interactive, normal affect  Speech: fluent w/o paraphasic error Memory: good recent and remote recall CN: PERRL, EOMI no nystagmus, no ptosis, unable to visualize optic disc bilat, VFF to FC bilat, sensation intact to LT V1-V3 bilat, face symmetric, no weakness, hearing grossly intact, palate elevates symmetrically, shoulder shrug 5/5 bilat,  tongue protrudes midline, no fasiculations noted. Motor: normal bulk and tone Strength: 5/5  In all extremities Coord: rapid alternating and point-to-point (FNF, HTS) movements intact. Reflexes: symmetrical, bilat downgoing toes Sens: LT intact in all extremities Gait: posture, stance, stride and arm-swing normal.  Assessment:  After physical and neurologic examination, review of laboratory studies, imaging, neurophysiology testing and pre-existing records, assessment : Plan:  Treatment plan and additional workup will be reviewed under the list:  1)Migraine without aura, improved on pentadolex. headaches are worse when Insomnia caused sleep deprivation.  2) Insomnia, had 2 sleep studies, Dr. Janann Colonel referred to the [facility] and she underwent a PSG , but never got the results from him. .  ( There were no organic reasons identified, I found her study on Epic. ) 3) anxiety and depression - high fatigue.   4) sleep deprivation  Due to 2 job  situation, needs  to establish an earlier bedtime, she works 70 weekly hours and has 1.5 hours  of commuting time . She works on week ends in an urgent care.   This patient has failed Sonata, trazodone, Ambien and Ambien CR in the past, Belsomra, and Lunesta. She has tried Rozerem before Pharmacologist, amitriptyline and clonazepam.  Ms Virrueta is a pleasant 54 year old woman presenting for followup evaluation of chronic migraine headache.  She has done very well with petadolex, a nutrional herbal supplement for [place].   She has been tried on multiple agents with no notable therapeutic benefit for the insomnia., as listed above.  She has gone through 2 courses of Botox and is due for her 3rd cycle, with  Minimal success.  Continues to have difficulty with insomnia which is likely the main trigger of her headaches and caused bu non organic factors.  Will continue inderal for headache prophylaxis,  Petadolax 50mg  TID.  Melatonin 10mg , and new Seroquel 25 mg nightly .this may help depression and worried state as well as insomnia.   If this is not successful, I will need to refer to behavior health/ modification.

## 2014-08-19 NOTE — Patient Instructions (Signed)
Insomnia Insomnia is frequent trouble falling and/or staying asleep. Insomnia can be a long term problem or a short term problem. Both are common. Insomnia can be a short term problem when the wakefulness is related to a certain stress or worry. Long term insomnia is often related to ongoing stress during waking hours and/or poor sleeping habits. Overtime, sleep deprivation itself can make the problem worse. Every little thing feels more severe because you are overtired and your ability to cope is decreased. CAUSES   Stress, anxiety, and depression.  Poor sleeping habits.  Distractions such as TV in the bedroom.  Naps close to bedtime.  Engaging in emotionally charged conversations before bed.  Technical reading before sleep.  Alcohol and other sedatives. They may make the problem worse. They can hurt normal sleep patterns and normal dream activity.  Stimulants such as caffeine for several hours prior to bedtime.  Pain syndromes and shortness of breath can cause insomnia.  Exercise late at night.  Changing time zones may cause sleeping problems (jet lag). It is sometimes helpful to have someone observe your sleeping patterns. They should look for periods of not breathing during the night (sleep apnea). They should also look to see how long those periods last. If you live alone or observers are uncertain, you can also be observed at a sleep clinic where your sleep patterns will be professionally monitored. Sleep apnea requires a checkup and treatment. Give your caregivers your medical history. Give your caregivers observations your family has made about your sleep.  SYMPTOMS   Not feeling rested in the morning.  Anxiety and restlessness at bedtime.  Difficulty falling and staying asleep. TREATMENT   Your caregiver may prescribe treatment for an underlying medical disorders. Your caregiver can give advice or help if you are using alcohol or other drugs for self-medication. Treatment  of underlying problems will usually eliminate insomnia problems.  Medications can be prescribed for short time use. They are generally not recommended for lengthy use.  Over-the-counter sleep medicines are not recommended for lengthy use. They can be habit forming.  You can promote easier sleeping by making lifestyle changes such as:  Using relaxation techniques that help with breathing and reduce muscle tension.  Exercising earlier in the day.  Changing your diet and the time of your last meal. No night time snacks.  Establish a regular time to go to bed.  Counseling can help with stressful problems and worry.  Soothing music and white noise may be helpful if there are background noises you cannot remove.  Stop tedious detailed work at least one hour before bedtime. HOME CARE INSTRUCTIONS   Keep a diary. Inform your caregiver about your progress. This includes any medication side effects. See your caregiver regularly. Take note of:  Times when you are asleep.  Times when you are awake during the night.  The quality of your sleep.  How you feel the next day. This information will help your caregiver care for you.  Get out of bed if you are still awake after 15 minutes. Read or do some quiet activity. Keep the lights down. Wait until you feel sleepy and go back to bed.  Keep regular sleeping and waking hours. Avoid naps.  Exercise regularly.  Avoid distractions at bedtime. Distractions include watching television or engaging in any intense or detailed activity like attempting to balance the household checkbook.  Develop a bedtime ritual. Keep a familiar routine of bathing, brushing your teeth, climbing into bed at the same   time each night, listening to soothing music. Routines increase the success of falling to sleep faster.  Use relaxation techniques. This can be using breathing and muscle tension release routines. It can also include visualizing peaceful scenes. You can  also help control troubling or intruding thoughts by keeping your mind occupied with boring or repetitive thoughts like the old concept of counting sheep. You can make it more creative like imagining planting one beautiful flower after another in your backyard garden.  During your day, work to eliminate stress. When this is not possible use some of the previous suggestions to help reduce the anxiety that accompanies stressful situations. MAKE SURE YOU:   Understand these instructions.  Will watch your condition.  Will get help right away if you are not doing well or get worse. Document Released: 07/27/2000 Document Revised: 10/22/2011 Document Reviewed: 08/27/2007 ExitCare Patient Information 2015 [employer]. This information is not intended to replace advice given to you by your health care provider. Make sure you discuss any questions you have with your health care provider.  

## 2014-08-31 ENCOUNTER — Telehealth: Payer: Self-pay | Admitting: Neurology

## 2014-08-31 DIAGNOSIS — G4700 Insomnia, unspecified: Secondary | ICD-10-CM

## 2014-08-31 DIAGNOSIS — G43101 Migraine with aura, not intractable, with status migrainosus: Secondary | ICD-10-CM

## 2014-08-31 DIAGNOSIS — F5105 Insomnia due to other mental disorder: Secondary | ICD-10-CM

## 2014-08-31 NOTE — Telephone Encounter (Signed)
I called back to clarify.  Got no answer.  Left message.

## 2014-08-31 NOTE — Telephone Encounter (Signed)
Patient is calling about Rx Quetiapine 25 mg prescribed at bedtime for sleep. Patient could not go to sleep so she went back to her Ambien CR.   Should patient increase dosage of Quetiapine or should patient go back to the Ambien CR.  Please call.

## 2014-09-01 NOTE — Telephone Encounter (Signed)
Patient stated she took new Rx QUEtiapine (SEROQUEL) 25 MG tablet as instructed by Dr. Brett Fairy, when she had no where to go the next day.  She started the medication on Sunday night and didn't sleep at all and felt horrible the next day.  Therefore, she took the Rx  zolpidem (AMBIEN CR) 12.5 MG CR tablet, Monday and Tuesday night in order to sleep.  She's not taking both medications or alternating them.  She's questioning if QUETIAPINE dosage needs to be increased or should she resume taking AMBIEN CR.  If AMBIEN CR is recommended need new Rx sent to [facility] on [facility].  Please call and advise.

## 2014-09-01 NOTE — Telephone Encounter (Signed)
Patient called saying she tried Seroquel, and it did not help her sleep.  She would like to know if the dose should be increased, or if she needs to go back to taking Ambien CR.  Please advise.  Thank you.

## 2014-09-06 MED ORDER — QUETIAPINE FUMARATE 25 MG PO TABS
ORAL_TABLET | ORAL | Status: DC
Start: 2014-09-06 — End: 2014-11-26

## 2014-09-06 NOTE — Telephone Encounter (Signed)
I called back.  Patient said she has tried to take 1.5 and 2 tabs at night but felt groggy.  When she did this, she only tried once, not multiple days in a row.  Says she will try again this weekend and let us know how she feels after taking same dose for 3 days in a row.  Feels he has gotten some benefit because her "fitbit" tracks sleeping habits and she was waking up 30 times per night, now only waking up 18 times per night.  She will contact her PCP regarding Ambien and will call us back if anything further is needed.

## 2014-09-06 NOTE — Telephone Encounter (Signed)
Please try 50 mg seroquel for 3 days, prefer over weekend when you don't have to be up refreshed in AM.  If this again does not work-  I will refer to behavior health , and ask her PCP to refill ambien CR.

## 2014-09-22 ENCOUNTER — Telehealth: Payer: Self-pay | Admitting: Neurology

## 2014-09-22 DIAGNOSIS — G44229 Chronic tension-type headache, not intractable: Secondary | ICD-10-CM

## 2014-09-22 MED ORDER — PROPRANOLOL HCL 20 MG PO TABS: ORAL_TABLET | ORAL | Status: DC

## 2014-09-22 NOTE — Telephone Encounter (Signed)
Patient called office saying she is having migraines 2-3 times per week and gabapentin is not working well.  She would like to know if dose should be adjusted, or if something else is recommended.  Also wanted to advise she saw her PCP last month and got a shot for migraine headache.  Please advise.  Thank you.    Regarding Seroquel, Dr Brett Fairy has already changed the Seroquel Rx to 25mg  up to two nightly.  I called back, got no answer.  Left message.

## 2014-09-22 NOTE — Telephone Encounter (Signed)
i have seen patient for sleep.  Seroquel for sleep,  Are her migraines associated with nausea and does she have trouble taking a tablet ?Vicki Wilson She can use a nasal spray in that case.  Rv with NP , 30 minutes.

## 2014-09-22 NOTE — Telephone Encounter (Signed)
Pt calling back needing refill on QUEtiapine (SEROQUEL) 25 MG tablet and she needs the dose to be changed to 1 1/2 tab, also she is complaining of having more migraines 2-3 a week and the gabapentin (NEURONTIN) 100 MG capsule is not working well.  She had to go see her PCP on 1/11 to get a shot for her migraine.  Please call and advise.

## 2014-09-23 NOTE — Telephone Encounter (Signed)
LMVM for pt to return call re: migraines.

## 2014-09-24 MED ORDER — GABAPENTIN 100 MG PO CAPS: 100.0000 mg | ORAL_CAPSULE | Freq: Two times a day (BID) | ORAL | Status: DC

## 2014-09-24 NOTE — Telephone Encounter (Signed)
I called pt and she relayed that she wanted refill on gabapentin which she is using prn for her migraine headaches.  She noted that she is having more headaches.  Usually taking gabapentin 2-3 caps max / day along with tylenol would help her headaches.  Her last one she had for 3 days then went to pcp and was given phenergan / toradol whic broke the cycle.  She has tried migranal nasal spray before which was not effective for her.  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  Would gabapentin refill be ok?

## 2014-09-24 NOTE — Telephone Encounter (Signed)
Patient returning [place] call, requesting a return call on cell # [phone].

## 2014-09-29 ENCOUNTER — Other Ambulatory Visit: Payer: Self-pay | Admitting: *Deleted

## 2014-09-29 DIAGNOSIS — G44229 Chronic tension-type headache, not intractable: Secondary | ICD-10-CM

## 2014-09-29 MED ORDER — GABAPENTIN 100 MG PO CAPS: 100.0000 mg | ORAL_CAPSULE | Freq: Two times a day (BID) | ORAL | Status: DC

## 2014-09-29 NOTE — Telephone Encounter (Signed)
Ok for refill per Dr. Brett Fairy.  Gabapentin 100mg  po bid # 60, no refill.  LMVM for pt.

## 2014-09-29 NOTE — Telephone Encounter (Signed)
Refill for gabapentin 100mg  po bid # 60 no refill (ok per Dr. Brett Fairy).

## 2014-11-25 ENCOUNTER — Telehealth: Payer: Self-pay | Admitting: Neurology

## 2014-11-25 NOTE — Telephone Encounter (Signed)
I LMVM for pt that was returning her call.

## 2014-11-25 NOTE — Telephone Encounter (Signed)
Pt is calling stating that the QUEtiapine (SEROQUEL) 25 MG tablet is not working for her and she would like to have a referral for a sleep consult.  Please call and advise.

## 2014-11-26 ENCOUNTER — Telehealth: Payer: Self-pay | Admitting: *Deleted

## 2014-11-26 DIAGNOSIS — F5105 Insomnia due to other mental disorder: Secondary | ICD-10-CM

## 2014-11-26 DIAGNOSIS — G43101 Migraine with aura, not intractable, with status migrainosus: Secondary | ICD-10-CM

## 2014-11-26 DIAGNOSIS — G4700 Insomnia, unspecified: Secondary | ICD-10-CM

## 2014-11-26 MED ORDER — QUETIAPINE FUMARATE 25 MG PO TABS: ORAL_TABLET | ORAL | Status: DC

## 2014-11-26 NOTE — Telephone Encounter (Signed)
Returning [place] call.  Please call # [phone].

## 2014-11-26 NOTE — Telephone Encounter (Signed)
Ok to refill seroquel and keep appt with Dr. Keturah Barre later this month.

## 2014-11-26 NOTE — Telephone Encounter (Signed)
LMVM for pt to return call.   

## 2014-11-26 NOTE — Telephone Encounter (Signed)
I called pt and she is taking seoquel 1 1/2 tabs since the 2 tabs makes her to groggy. (feels likes she wants to stay in bed all day long.  Initially 1 1/2 tab helped , now not working as well.  She relays that Dr. Brett Fairy stated to get sleep consult.   I see in Dr. Edwena Felty note if not working to get behavioral / health modificaton.  ??. Since ? Of what type of referral will send to Dr. Rexene Alberts? Pt needing refill on seroquel. (last dose Saturday).

## 2014-12-09 ENCOUNTER — Ambulatory Visit (INDEPENDENT_AMBULATORY_CARE_PROVIDER_SITE_OTHER): Payer: BLUE CROSS/BLUE SHIELD | Admitting: Neurology

## 2014-12-09 ENCOUNTER — Encounter: Payer: Self-pay | Admitting: Neurology

## 2014-12-09 VITALS — BP 112/79 | HR 62 | Resp 18 | Ht 64.0 in | Wt 148.0 lb

## 2014-12-09 DIAGNOSIS — G43511 Persistent migraine aura without cerebral infarction, intractable, with status migrainosus: Secondary | ICD-10-CM | POA: Diagnosis not present

## 2014-12-09 DIAGNOSIS — F5102 Adjustment insomnia: Secondary | ICD-10-CM

## 2014-12-09 MED ORDER — ZOLPIDEM TARTRATE ER 12.5 MG PO TBCR: 12.5000 mg | EXTENDED_RELEASE_TABLET | Freq: Every day | ORAL | Status: DC

## 2014-12-09 MED ORDER — KETOROLAC TROMETHAMINE 10 MG PO TABS: 10.0000 mg | ORAL_TABLET | Freq: Four times a day (QID) | ORAL | Status: DC | PRN

## 2014-12-09 MED ORDER — PROMETHAZINE HCL 12.5 MG PO TABS: 12.5000 mg | ORAL_TABLET | Freq: Four times a day (QID) | ORAL | Status: DC | PRN

## 2014-12-09 NOTE — Progress Notes (Signed)
Provider:  Dr Janann Colonel Referring Provider: Shirline Frees, MD Primary Care Physician:  Shirline Frees, MD  CC:  Headaches, presumed to be migraines.  HPI:  Vicki Wilson is a 54 y.o. afro- [place] , right handed  female here alone,  last visit being 12/2013 with Dr. Janann Colonel and previously followed by Dr Rexene Alberts and Dr Erling Cruz.     Per old records with Dr Rexene Alberts and Dr Erling Cruz: In summary, Vicki Wilson is a very pleasant 54 y.o.-year old female previously followed by Dr. Erling Cruz with a history of migraine without aura. She has been tried on multiple different abortive medications and has tried preventative medications in the form of imipramine and Topamax as well as Effexor or in the past and is currently on low-dose Effexor and low-dose propranolol. I would like to suggest a cautious increase in her propranolol to 10 mg 3 times a day. On 40 mg daily she had diarrhea in the past. We will have to see how it goes. I had a long chat with the patient regarding her symptoms. Her exam is nonfocal and I reassured her in that regard. She is advised to continue using Flexeril and over-the-counter anti-inflammatory as needed for abortive treatment. She had side effects with several triptans in the past. She is encouraged to followup with her primary care physician for her chronic insomnia. I did give her a prescription for propranolol 10 mg 3 times a day with refills. I suggested a six-month followup with one of my colleagues for her chronic migraines. I answered all her questions today and she is encouraged to call with any interim problems. She was in agreement. This is her history : PS. D.O.  Continues to have difficulty with sleep. Has difficulty falling asleep and staying asleep, typically is getting around 4 hours of sleep a night. Currently is taking the Ambien CR and Melatonin nightly. Has tried Belsomra in the past but did not notice any benefit. Has had a sleep study completed since last visit which was  unremarkable.  Continues to have difficulty with headaches. Typically will have a headache around 50% of the days of the month. Currently taking Inderal 20mg  BID, riboflavin 400 and Magnesium 400mg . Similar headaches to prior visit.  Otherwise no acute symptoms. Remains stressed due to work and lack of sleep.  Has tried Ambien, Ambien CR, Lunesta, Sonata, Pamelor, Trazodone, Elavil. Currently taking Ambien CR and Melatonin 10mg  nightly. Has not gotten much benefit from this regimen.   Interval history :  08-19-13 , Vicki Wilson presents today for the first time to me in a face-to-face visit with a history of migraines and chronic insomnia that follows a cyclic  Her migraines had much improved after she started butterburr .  Her insomnia however has not responded to multiple prescription agents in the past. She has suffered from insomnia for at least 21 years in a cyclic pattern and after melatonin and Belsomra the last 2 agents she tried failed in combination she is now returned to Ambien CR 12.5 mg at night but she also does take melatonin an hour prior to bedtime with this. She also takes when necessary Flexeril and takes Neurontin which should promote sleep as well. Her medication list has been reviewed prior to this visit.  Sleep habits the patient usually goes to bed between 11 and 11:30 PM, even sleep aids she may lie awake until 3 or 3:30 AM before she can first time initiate sleep. Once asleep she will stay asleep  for about 4-1/2 hours. She rarely has nocturia and usually does not drink liquids prior to bedtime. She does not report nocturnal palpitations or nightmares, she does not report nocturnal hot flashes also she may suffer some in daytime.  She drinks rarely caffeine. She does not nap in daytime except on week ends, She works 2 jobs .  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 She has  early morning wakefulness. She is not refreshed in AM ,she does rely on an alarm.    Interval history from 12-09-14 : Shortly after our generally visit the patient was in so much headache discomfort that she needed an additional dose of Phenergan and off Toradol this helped her to sleep relax and actually broke the headache cycle. I would like to discuss with her to prescribe these 2 medications in all form so that she may avoid having to go to the emergency room or urgent care at headache onset. Her 2 job situaion is unchanged , but  her sleep insomnia responded to seroquel and Melatonin. She doesn't like the grogginess. She tried again Ambien and felt more energetic.        Concerns/Questions:Review of Systems: Out of a complete 14 system review, the patient complains of only the following symptoms, and all other reviewed systems are negative for fatigue,  Headache,  passing out,   Insomnia- problems to initiate Sleep , but can stay asleep for about 3-5 hours , not enough sleep The patient has a long commute to work about 40 minutes each way. She endorsed the Epworth sleepiness score today at 8 points. Her sleepiness is felt worse after lunch in the early afternoon if she would have an opportunity to sleep she would.   History   Social History  . Marital Status: Single    Spouse Name: N/A  . Number of Children: 0  . Years of Education: 2 Masters   Occupational History  . PHYSICIAN ASSISTANT    Social History Main Topics  . Smoking status: Never Smoker   . Smokeless tobacco: Never Used  . Alcohol Use: Yes     Comment: 5 oz of white wine every 3-6 months   . Drug Use: No  . Sexual Activity: No   Other Topics Concern  . Not on file   Social History Narrative   Patient is single no children, right handed,    Education level is 2 Master's degree   Caffeine consumption is 0    Family History  Problem Relation Age of Onset  . High blood pressure Mother   . Hyperlipidemia  Mother   . Gout Father   . High blood pressure Father   . Diabetes Mellitus I Sister   . Other Sister     seizure disorder  . Alzheimer's disease Maternal Grandmother   . Cancer Paternal Grandmother     colon  . Parkinson's disease Maternal Grandfather     Past Medical History  Diagnosis Date  . Stroke   . Headache(784.0)   . Depression   . Anxiety   . Migraines   . Uterine fibroids affecting pregnancy   . IBS (irritable bowel syndrome)   . GERD (gastroesophageal reflux disease)   . PFO (patent foramen ovale)   . Abnormal Pap smear     ASCUS-08/1998  LGSIL/HPV- 01/1999    Past Surgical History  Procedure Laterality Date  .  Tonsillectomy    . Bilateral inferior tubinate reduction  1998  . 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 dexlansoprazole (DEXILANT) 60 MG capsule Take 60 mg by mouth daily.    Marland Kitchen acetaminophen (TYLENOL) 500 MG tablet Take 1,000 mg by mouth every 8 (eight) hours as needed.    Marland Kitchen CARAFATE 1 GM/10ML suspension Take 1 g by mouth as needed.     . cyclobenzaprine (FLEXERIL) 5 MG tablet Take 1 tablet (5 mg total) by mouth 3 (three) times daily as needed. 60 tablet 3  . fluticasone (FLONASE) 50 MCG/ACT nasal spray Place 2 sprays into the nose daily as needed.     . gabapentin (NEURONTIN) 100 MG capsule Take 1 capsule (100 mg total) by mouth 2 (two) times daily. (Patient taking differently: Take 100 mg by mouth 2 (two) times daily as needed. ) 60 capsule 0  . LINZESS 145 MCG CAPS capsule   4  . Melatonin 10 MG TABS Take 10 tablets by mouth daily. Taking  1 tab QHS    . Multiple Vitamins-Iron (MULTIVITAMINS WITH IRON) TABS Take 1 tablet by mouth daily.    Marland Kitchen omeprazole-sodium bicarbonate (ZEGERID) 40-1100 MG per capsule Take 1 capsule by mouth 2 (two) times daily.     . Petasin (PETADOLEX PO) Take 50 mg by mouth 2 (two) times daily.    . propranolol (INDERAL) 20 MG tablet  40 mg at night po, for migraine prophyslaxis. 180 tablet 1  . QUEtiapine (SEROQUEL) 25 MG tablet Use up to two at bedtime. (Patient not taking: Reported on 12/09/2014) 30 tablet 1  . triamcinolone cream (KENALOG) 0.1 % Apply 1 application topically 2 (two) times daily as needed.     . venlafaxine XR (EFFEXOR-XR) 37.5 MG 24 hr capsule take 1 capsule by mouth once daily for 2 weeks, then take 2 capsules by mouth once daily. (Patient taking differently: take 1 capsule by mouth once daily for 2 weeks, then take 1 capsules by mouth once daily.) 60 capsule 6  . zolpidem (AMBIEN CR) 12.5 MG CR tablet Take 1 tablet (12.5 mg total) by mouth at bedtime. 30 tablet 1   No current facility-administered medications for this visit.    Allergies as of 12/09/2014 - Review Complete 12/09/2014  Allergen Reaction Noted  . Erythromycin  11/11/2011  . Triptans  12/09/2014    Vitals: BP 112/79 mmHg  Pulse 62  Resp 18  Ht 5\' 4"  (1.626 m)  Wt 148 lb (67.132 kg)  BMI 25.39 kg/m2  LMP 08/28/2013 (Exact Date) Last Weight:  Wt Readings from Last 1 Encounters:  12/09/14 148 lb (67.132 kg)   Last Height:   Ht Readings from Last 1 Encounters:  12/09/14 5\' 4"  (1.626 m)     Physical exam: Exam: Gen: NAD, conversant Eyes: anicteric sclerae, moist conjunctivae HENT: Atraumatic, oropharynx clear Lungs: CTA, no wheezing, rales, rhonic                          CV: RRR, no MRG Abdomen: Soft, non-tender;  Extremities: No peripheral edema  Skin: Normal temperature, no rash,  Psych: Appropriate affect, pleasant  Neuro: Vicki: AA&Ox3, appropriately interactive, normal affect - she feels drowsy.  Speech: fluent w/o paraphasic error, no aphasia no dysphonia.  Memory: good recent and remote recall CN: PERRL, EOMI no nystagmus, no ptosis, VFF bilat,  sensation intact to  LT V1-V3 bilat, face symmetric, no weakness, hearing grossly intact, palate elevates symmetrically, shoulder shrug 5/5 bilat,  tongue protrudes  midline, no fasiculations noted. Motor: normal bulk and tone/ Strength:5/5  In all extremities Coord: rapid alternating and point-to-point (FNF, HTS) movements intact. Reflexes: symmetrical, bilat downgoing toes Sens: LT intact in all extremities Gait: posture, stance, stride and arm-swing were  normal.  Assessment:  After physical and neurologic examination, review of laboratory studies, imaging, neurophysiology testing and pre-existing records, assessment : Plan:  Treatment plan and additional workup will be reviewed under the list:  1)Migraine without aura, improved on pentadolex. headaches are worse when Insomnia caused sleep deprivation. I will refill Ambien for this patient who recently switched from cerebral to Ambien and she had run out of Seroquel. But she noticed that she was more alert in daytime less groggy less drowsy. Sleep deprivation also precedes many of her headaches. In addition I would like to investigate if Toradol and Phenergan in a by mouth form could be helpful for this patient. I will prescribe those medications for treatment at home so she can hopefully avoid visits to the ER or urgent care.  2) Insomnia, had 2 sleep studies, Dr. Janann Colonel [facility] and she underwent a PSG , but never got the results from him. .  ( There were no organic reasons identified, I found her study on Epic. ) 3) anxiety and depression - high fatigue.   4) sleep deprivation:   Due to 2 job situation, needs to establish an earlier bedtime, she works 70 weekly hours and has 1.5 hours  of commuting time . She works on week ends in an urgent care.   This patient has failed Sonata, trazodone, Ambien and Ambien CR in the past, Belsomra, and Lunesta. She has tried Rozerem before Pharmacologist, amitriptyline and clonazepam.  Vicki Wilson is a pleasant 54 year old woman presenting for followup evaluation of chronic migraine headache.   She has done very well with petadolex, a  nutrional herbal supplement for [place].   She has been tried on multiple agents - through 2 courses of Botox and is due for her 3rd cycle, with  Minimal success.    Petadolax 50mg  TID.  Melatonin 10mg , and ambien 12.5 CR  Mg for insomnia.   If this is not successful, I will need to refer to behavior health/ modification.

## 2014-12-09 NOTE — Patient Instructions (Addendum)
Insomnia Insomnia is frequent trouble falling and/or staying asleep. Insomnia can be a long term problem or a short term problem. Both are common. Insomnia can be a short term problem when the wakefulness is related to a certain stress or worry. Long term insomnia is often related to ongoing stress during waking hours and/or poor sleeping habits. Overtime, sleep deprivation itself can make the problem worse. Every little thing feels more severe because you are overtired and your ability to cope is decreased. CAUSES   Stress, anxiety, and depression.  Poor sleeping habits.  Distractions such as TV in the bedroom.  Naps close to bedtime.  Engaging in emotionally charged conversations before bed.  Technical reading before sleep.  Alcohol and other sedatives. They may make the problem worse. They can hurt normal sleep patterns and normal dream activity.  Stimulants such as caffeine for several hours prior to bedtime.  Pain syndromes and shortness of breath can cause insomnia.  Exercise late at night.  Changing time zones may cause sleeping problems (jet lag). It is sometimes helpful to have someone observe your sleeping patterns. They should look for periods of not breathing during the night (sleep apnea). They should also look to see how long those periods last. If you live alone or observers are uncertain, you can also be observed at a sleep clinic where your sleep patterns will be professionally monitored. Sleep apnea requires a checkup and treatment. Give your caregivers your medical history. Give your caregivers observations your family has made about your sleep.  SYMPTOMS   Not feeling rested in the morning.  Anxiety and restlessness at bedtime.  Difficulty falling and staying asleep. TREATMENT   Your caregiver may prescribe treatment for an underlying medical disorders. Your caregiver can give advice or help if you are using alcohol or other drugs for self-medication. Treatment  of underlying problems will usually eliminate insomnia problems.  Medications can be prescribed for short time use. They are generally not recommended for lengthy use.  Over-the-counter sleep medicines are not recommended for lengthy use. They can be habit forming.  You can promote easier sleeping by making lifestyle changes such as:  Using relaxation techniques that help with breathing and reduce muscle tension.  Exercising earlier in the day.  Changing your diet and the time of your last meal. No night time snacks.  Establish a regular time to go to bed.  Counseling can help with stressful problems and worry.  Soothing music and white noise may be helpful if there are background noises you cannot remove.  Stop tedious detailed work at least one hour before bedtime. HOME CARE INSTRUCTIONS   Keep a diary. Inform your caregiver about your progress. This includes any medication side effects. See your caregiver regularly. Take note of:  Times when you are asleep.  Times when you are awake during the night.  The quality of your sleep.  How you feel the next day. This information will help your caregiver care for you.  Get out of bed if you are still awake after 15 minutes. Read or do some quiet activity. Keep the lights down. Wait until you feel sleepy and go back to bed.  Keep regular sleeping and waking hours. Avoid naps.  Exercise regularly.  Avoid distractions at bedtime. Distractions include watching television or engaging in any intense or detailed activity like attempting to balance the household checkbook.  Develop a bedtime ritual. Keep a familiar routine of bathing, brushing your teeth, climbing into bed at the same   time each night, listening to soothing music. Routines increase the success of falling to sleep faster.  Use relaxation techniques. This can be using breathing and muscle tension release routines. It can also include visualizing peaceful scenes. You can  also help control troubling or intruding thoughts by keeping your mind occupied with boring or repetitive thoughts like the old concept of counting sheep. You can make it more creative like imagining planting one beautiful flower after another in your backyard garden.  During your day, work to eliminate stress. When this is not possible use some of the previous suggestions to help reduce the anxiety that accompanies stressful situations. MAKE SURE YOU:   Understand these instructions.  Will watch your condition.  Will get help right away if you are not doing well or get worse. Document Released: 07/27/2000 Document Revised: 10/22/2011 Document Reviewed: 08/27/2007 [facility] Patient Information 2015 [place], [place]. This information is not intended to replace advice given to you by your health care provider. Make sure you discuss any questions you have with your health care provider.   The patient has in the past tried oral triptan's and developed high levels of CPK and myalgia. Based on this I have restraints for self from prescribing the new Imitrex patch.

## 2015-01-04 ENCOUNTER — Telehealth: Payer: Self-pay | Admitting: Neurology

## 2015-01-04 MED ORDER — VENLAFAXINE HCL ER 37.5 MG PO CP24: 37.5000 mg | ORAL_CAPSULE | Freq: Every day | ORAL | Status: DC

## 2015-01-04 NOTE — Telephone Encounter (Signed)
Patient is calling in regard to Rx Effexor XR 37.5 mg 24 hr capsule. She is using [facility] on [address], [place]. She needs Dr. Brett Fairy to approve as she previously was prescribed by Dr. Janann Colonel.  Thanks!

## 2015-01-04 NOTE — Telephone Encounter (Signed)
I called back.  Patient verified she is taking one capsule daily.  Rx has been sent.  She is aware.

## 2015-01-11 ENCOUNTER — Telehealth: Payer: Self-pay | Admitting: Neurology

## 2015-01-11 DIAGNOSIS — G43511 Persistent migraine aura without cerebral infarction, intractable, with status migrainosus: Secondary | ICD-10-CM

## 2015-01-11 NOTE — Telephone Encounter (Signed)
Patient called and stated that she is having severe migraines and that her medication has not been helping. She has requested to speak with someone regarding these issues. Please call and advise.

## 2015-01-11 NOTE — Telephone Encounter (Signed)
Returned pt's phone call. No answer. Left message on home machine and mobile asking her to call me back.

## 2015-01-12 MED ORDER — TOPIRAMATE 25 MG PO TABS: 25.0000 mg | ORAL_TABLET | Freq: Every day | ORAL | Status: DC

## 2015-01-12 NOTE — Telephone Encounter (Signed)
Spoke to pt re: her migraines. She says that she thinks her preventives are not working. She is taking the phenergan and toradol 4-5 times a week, even though they are prn. Spoke to Dr. Brett Fairy and she recommended Zecuity for the pt. Called pt back, no answer, left message on mobile asking her to call me back.

## 2015-01-12 NOTE — Telephone Encounter (Signed)
Dr. Brett Fairy spoke to pt on phone re: migraines. She prescribed topamax 25 mg qhs, and to increase it by 25 mg weekly as tolerated.

## 2015-02-02 LAB — HEPATITIS C ANTIBODY: Hep C Virus Ab: NEGATIVE

## 2015-02-02 LAB — HIV ANTIBODY (ROUTINE TESTING W REFLEX): HIV: NONREACTIVE

## 2015-03-03 ENCOUNTER — Telehealth: Payer: Self-pay | Admitting: Neurology

## 2015-03-03 NOTE — Telephone Encounter (Signed)
Patient is calling. The patient is still having difficulty sleeping at night. The medications topiramate (TOPAMAX) 25 MG tablet and zolpidem (AMBIEN CR) 12.5 MG CR tablet are not helping with sleep but the topiramate (TOPAMAX) 25 MG tablet is helping with her headache.   Patient is also taking Melatonin 10mg  and Librax. Please call patient and discuss. Thank you.

## 2015-03-03 NOTE — Telephone Encounter (Signed)
Spoke to Dr. Brett Fairy. Dr. Brett Fairy is not willing to give pt any more medications, she has tried everything. She wants to see if pt is willing to have a referral to psychology to help with sleep behavior. Pt needs to dedicate at least 6 hours nightly.  No answer, asked pt to call us back.

## 2015-03-04 NOTE — Telephone Encounter (Signed)
Patient returned call. Please call and advise.  °

## 2015-03-07 NOTE — Telephone Encounter (Signed)
I called the number given but when to a primary care RN's voicemail. I left a message on pt's cell phone asking her to call me back. Please skype me when pt calls back. Thanks.

## 2015-03-07 NOTE — Telephone Encounter (Signed)
Spoke to pt in a lengthy conversation. Pt reports that she has an easier time going to sleep but is having trouble staying asleep and is very restless at night. She feels very groggy during the day. Her fitbit reports that she is restless 36-42 times a night. She reports taking ambien 12.5 mg, propanolol 20 mg, topamax 37.5 mg, melatonin, librax, and petadolax all at night. She wants to know if she should take belsomra with something else to help her stay asleep longer and awake with more energy and less grogginess. I advised pt that Dr. Brett Fairy is reluctant to give her anything else for sleep and that for some people, insomnia is an unfortunate norm for them. Dr. Brett Fairy advised pt to stop taking all her sleep medications for a week and see what happened. If this doesn't work, she would think about giving a low dose of seroquel. Pt said to call her with Dr. Edwena Felty recommendations and leave a message on her cell phone.  I called and left a message on pt's cell phone informing her that Dr. Brett Fairy advised stopping all of her sleep meds for one week and to find out if she is able to fall asleep, stay asleep, and how she feels during the day. I asked her to call me back with any further concerns and questions.

## 2015-03-07 NOTE — Telephone Encounter (Addendum)
Patient is returning your call. She states she may be in with a patient but to call her nurse Freda Munro and have her come and get her [phone]. Thanks!

## 2015-03-07 NOTE — Telephone Encounter (Signed)
Left messages at both home and cell numbers asking pt to call me back.

## 2015-03-11 NOTE — Telephone Encounter (Signed)
Patient is returning a call and has more questions.

## 2015-03-14 ENCOUNTER — Telehealth: Payer: Self-pay | Admitting: Neurology

## 2015-03-14 NOTE — Telephone Encounter (Signed)
Patient called returning Kristen's phone call. She can be reached at [phone]

## 2015-03-14 NOTE — Telephone Encounter (Signed)
Returned pts call. No answer, left a message asking her to call me back.

## 2015-03-15 MED ORDER — ZOLPIDEM TARTRATE ER 12.5 MG PO TBCR: 12.5000 mg | EXTENDED_RELEASE_TABLET | Freq: Every day | ORAL | Status: DC

## 2015-03-15 NOTE — Telephone Encounter (Signed)
I agree to continue ambien 12.5 for now. Mrs [place]  Will try the belsomra 1/2 to 1/2 .  Further communication on my chart . I left a detailed voicemail for the patient was unfortunately not available. I explained my concern about multiple medications to be mixed and that I consider belch summer the safest but probably also the least effective agent, I know that Seroquel has already left to a severe hangover or grogginess effect that she did not like, and Ambien has a potential habit-forming effects about which I am concerned. I will refill the Ambien for this month and I will ask her to allow me to send her to a sleep psychologist or psychiatrist with an interest in sleep I do not think that we can continue just prescribing medications we have not found an organic reason for a sleep disorder. I offered her to call me back and I will prefer to have future communications directly on my chart.

## 2015-03-15 NOTE — Telephone Encounter (Signed)
Spoke to pt. Pt reports that she has already tried a low dose of seroquel, (per Dr. Edwena Felty recommendation) but it made her too drowsy in the morning during her driving. Pt wants to know if she can continue the ambien CR 12.5 mg at bedtime.  She also wants to know if she can take 1/2 of belsomra with the ambien to help stay asleep. She also wondering if 1/2 of ambien and 1/2 of belsomra is an option.  She has a lot of stressors. She has several family members that are sick. The sleep deprivation is wearing her down. She is not interested in a psychologist or psychiatrist prescribing her psych meds that will make her loopy and drowsy during the day. She wishes to discuss what a psychologist/psychiatrist can do for her, and specifically Dr. Edwena Felty goals for the sleep psychologist/psychiatrist.  Pt wishes to have Dr. Brett Fairy call her and discuss this. She is seeing pt's during the day. It is ok to leave a detailed message on her cell phone.

## 2015-03-15 NOTE — Telephone Encounter (Signed)
Returned pt's phone call. No answer, left a message asking her to call me back. Please skype me when pt returns my call!

## 2015-04-04 ENCOUNTER — Other Ambulatory Visit: Payer: Self-pay

## 2015-04-04 MED ORDER — PROPRANOLOL HCL 20 MG PO TABS: ORAL_TABLET | ORAL | Status: DC

## 2015-04-04 NOTE — Telephone Encounter (Signed)
Prescribed in encounter on 02/10

## 2015-04-14 ENCOUNTER — Other Ambulatory Visit: Payer: Self-pay

## 2015-04-14 DIAGNOSIS — G44229 Chronic tension-type headache, not intractable: Secondary | ICD-10-CM

## 2015-04-14 MED ORDER — GABAPENTIN 100 MG PO CAPS: 100.0000 mg | ORAL_CAPSULE | Freq: Two times a day (BID) | ORAL | Status: DC

## 2015-05-31 ENCOUNTER — Telehealth: Payer: Self-pay

## 2015-05-31 ENCOUNTER — Other Ambulatory Visit: Payer: Self-pay | Admitting: Neurology

## 2015-05-31 MED ORDER — ZOLPIDEM TARTRATE ER 12.5 MG PO TBCR: 12.5000 mg | EXTENDED_RELEASE_TABLET | Freq: Every day | ORAL | Status: DC

## 2015-05-31 NOTE — Telephone Encounter (Signed)
Pharmacy says patient is requesting a refill on Ambien.  Per previous note from 08/01:  I will refill the Ambien for this month and I will ask her to allow me to send her to a sleep psychologist or psychiatrist with an interest in sleep I do not think that we can continue just prescribing medications we have not found an organic reason for a sleep disorder. I offered her to call me back and I will prefer to have future communications directly on my chart.  Patient has a follow up scheduled on 10/27.  Would you like to refill Ambien at this time?  Please advise.  Thank you.

## 2015-06-03 ENCOUNTER — Other Ambulatory Visit: Payer: Self-pay

## 2015-06-03 DIAGNOSIS — Z1231 Encounter for screening mammogram for malignant neoplasm of breast: Secondary | ICD-10-CM

## 2015-06-09 ENCOUNTER — Encounter: Payer: Self-pay | Admitting: Neurology

## 2015-06-09 ENCOUNTER — Ambulatory Visit (INDEPENDENT_AMBULATORY_CARE_PROVIDER_SITE_OTHER): Payer: BLUE CROSS/BLUE SHIELD | Admitting: Neurology

## 2015-06-09 VITALS — BP 114/68 | HR 62 | Resp 14 | Ht 64.0 in | Wt 147.0 lb

## 2015-06-09 DIAGNOSIS — F341 Dysthymic disorder: Secondary | ICD-10-CM

## 2015-06-09 DIAGNOSIS — F418 Other specified anxiety disorders: Secondary | ICD-10-CM

## 2015-06-09 DIAGNOSIS — F5105 Insomnia due to other mental disorder: Secondary | ICD-10-CM

## 2015-06-09 DIAGNOSIS — G43501 Persistent migraine aura without cerebral infarction, not intractable, with status migrainosus: Secondary | ICD-10-CM | POA: Diagnosis not present

## 2015-06-09 MED ORDER — VENLAFAXINE HCL ER 37.5 MG PO CP24: 37.5000 mg | ORAL_CAPSULE | Freq: Every day | ORAL | Status: DC

## 2015-06-09 MED ORDER — ZOLPIDEM TARTRATE ER 12.5 MG PO TBCR: 12.5000 mg | EXTENDED_RELEASE_TABLET | Freq: Every day | ORAL | Status: DC

## 2015-06-09 NOTE — Progress Notes (Signed)
Provider:  Dr Janann Colonel Referring Provider: Shirline Frees, MD Primary Care Physician:  Shirline Frees, MD  CC:  Headaches, presumed to be migraines.  HPI:  Vicki Wilson is a 54 y.o. afro- [place] , right handed  female here alone,  last visit being 12/2013 with Dr. Janann Colonel and previously followed by Dr Rexene Alberts and Dr Erling Cruz.     Per old records with Dr Rexene Alberts and Dr Erling Cruz: In summary, Vicki Wilson is a very pleasant 54 y.o.-year old female previously followed by Dr. Erling Cruz with a history of migraine without aura. She has been tried on multiple different abortive medications and has tried preventative medications in the form of imipramine and Topamax as well as Effexor or in the past and is currently on low-dose Effexor and low-dose propranolol. I would like to suggest a cautious increase in her propranolol to 10 mg 3 times a day. On 40 mg daily she had diarrhea in the past. We will have to see how it goes. I had a long chat with the patient regarding her symptoms. Her exam is nonfocal and I reassured her in that regard. She is advised to continue using Flexeril and over-the-counter anti-inflammatory as needed for abortive treatment. She had side effects with several triptans in the past. She is encouraged to followup with her primary care physician for her chronic insomnia. I did give her a prescription for propranolol 10 mg 3 times a day with refills. I suggested a six-month followup with one of my colleagues for her chronic migraines. I answered all her questions today and she is encouraged to call with any interim problems. She was in agreement. This is her history : PS. D.O.  Continues to have difficulty with sleep. Has difficulty falling asleep and staying asleep, typically is getting around 4 hours of sleep a night. Currently is taking the Ambien CR and Melatonin nightly. Has tried Belsomra in the past but did not notice any benefit. Has had a sleep study completed since last visit which was  unremarkable.  Continues to have difficulty with headaches. Typically will have a headache around 50% of the days of the month. Currently taking Inderal 20mg  BID, riboflavin 400 and Magnesium 400mg . Similar headaches to prior visit.  Otherwise no acute symptoms. Remains stressed due to work and lack of sleep.  Has tried Ambien, Ambien CR, Lunesta, Sonata, Pamelor, Trazodone, Elavil. Currently taking Ambien CR and Melatonin 10mg  nightly. Has not gotten much benefit from this regimen.   Interval history :  08-19-13 , Vicki Wilson presents today for the first time to me in a face-to-face visit with a history of migraines and chronic insomnia that follows a cyclic  Her migraines had much improved after she started butterburr .  Her insomnia however has not responded to multiple prescription agents in the past. She has suffered from insomnia for at least 21 years in a cyclic pattern and after melatonin and Belsomra the last 2 agents she tried failed in combination she is now returned to Ambien CR 12.5 mg at night but she also does take melatonin an hour prior to bedtime with this. She also takes when necessary Flexeril and takes Neurontin which should promote sleep as well. Her medication list has been reviewed prior to this visit.  Sleep habits the patient usually goes to bed between 11 and 11:30 PM, even sleep aids she may lie awake until 3 or 3:30 AM before she can first time initiate sleep. Once asleep she will stay asleep  for about 4-1/2 hours. She rarely has nocturia and usually does not drink liquids prior to bedtime. She does not report nocturnal palpitations or nightmares, she does not report nocturnal hot flashes also she may suffer some in daytime.  She drinks rarely caffeine. She does not nap in daytime except on week ends, She works 2 jobs .  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 She has  early morning wakefulness. She is not refreshed in AM ,she does rely on an alarm.    Interval history from 06-09-15 : Shortly after our January  visit the patient needed an additional dose of Phenergan and Toradol to break her HA, this helped her to sleep, to  relax and actually broke the headache cycle.  I would like to discuss with her to prescribe these 2 medications in a oral form so that she may avoid having to go to the emergency room or urgent care at headache onset. Some weeks she will have one or 2 headaches and other weeks she may have none at all which is an improvement. She goes to bed at 10:30 or 11 falls asleep but wakes up at 4 AM, after 4 she may remain dozing in bed but feels that this has no restorative or refreshing quality at all. She feels actually more tired if she doesn't rise. Her 2 job situaion is changed ,  insomnia responded to Seroquel and Melatonin but doesn't like the grogginess. She tried again Ambien and felt more energetic, less groggy. She endorsed today the Epworth sleepiness score at 13 points which makes him more sleepy than in last visit the fatigue severity score at 43 points.     Concerns/Questions:Review of Systems: Out of a complete 14 system review, the patient complains of only the following symptoms, and all other reviewed systems are negative for fatigue,  Headache,  passing out,   Insomnia- problems to initiate Sleep , but can stay asleep for about 3-5 hours , "not enough sleep"  She endorsed the Epworth sleepiness score today at 13 from 8 points. Her sleepiness is felt worse after lunch in the early afternoon if she would have an opportunity to sleep she would.   Social History   Social History  . Marital Status: Single    Spouse Name: N/A  . Number of Children: 0  . Years of Education: 2 Masters   Occupational History  . PHYSICIAN ASSISTANT    Social History Main Topics  . Smoking status: Never Smoker   . Smokeless tobacco: Never Used    . Alcohol Use: Yes     Comment: 5 oz of white wine every 3-6 months   . Drug Use: No  . Sexual Activity: No   Other Topics Concern  . Not on file   Social History Narrative   Patient is single no children, right handed,    Education level is 2 Master's degree   Caffeine consumption is 0    Family History  Problem Relation Age of Onset  . High blood pressure Mother   . Hyperlipidemia Mother   . Gout Father   . High blood pressure Father   . Diabetes Mellitus I Sister   . Other Sister     seizure disorder  . Alzheimer's disease Maternal Grandmother   . Cancer Paternal Grandmother     colon  . Parkinson's disease Maternal Grandfather  Past Medical History  Diagnosis Date  . Stroke ([place])   . Headache(784.0)   . Depression   . Anxiety   . Migraines   . Uterine fibroids affecting pregnancy   . IBS (irritable bowel syndrome)   . GERD (gastroesophageal reflux disease)   . PFO (patent foramen ovale)   . Abnormal Pap smear     ASCUS-08/1998  LGSIL/HPV- 01/1999    Past Surgical History  Procedure Laterality Date  . Tonsillectomy    . Bilateral inferior tubinate reduction  1998  . 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 acetaminophen (TYLENOL) 500 MG tablet Take 1,000 mg by mouth every 8 (eight) hours as needed.    Marland Kitchen CARAFATE 1 GM/10ML suspension Take 1 g by mouth as needed.     . clidinium-chlordiazePOXIDE (LIBRAX) 5-2.5 MG capsule Take 1 capsule by mouth.    . cyclobenzaprine (FLEXERIL) 5 MG tablet Take 1 tablet (5 mg total) by mouth 3 (three) times daily as needed. 60 tablet 3  . fluticasone (FLONASE) 50 MCG/ACT nasal spray Place 2 sprays into the nose daily as needed.     . gabapentin (NEURONTIN) 100 MG capsule Take 1 capsule (100 mg total) by mouth 2 (two) times daily. 60 capsule 1  . ketorolac (TORADOL) 10 MG tablet Take 1 tablet (10 mg total) by mouth every 6 (six)  hours as needed. 20 tablet 0  . LINZESS 145 MCG CAPS capsule   4  . Melatonin 10 MG TABS Take 10 tablets by mouth daily. Taking  1 tab QHS    . Multiple Vitamins-Iron (MULTIVITAMINS WITH IRON) TABS Take 1 tablet by mouth daily.    . Petasin (PETADOLEX PO) Take 50 mg by mouth 2 (two) times daily.    . promethazine (PHENERGAN) 12.5 MG tablet Take 1 tablet (12.5 mg total) by mouth every 6 (six) hours as needed for nausea or vomiting. 30 tablet 0  . propranolol (INDERAL) 20 MG tablet 40 mg at night po, for migraine prophyslaxis. 180 tablet 1  . topiramate (TOPAMAX) 25 MG tablet Take 1 tablet (25 mg total) by mouth at bedtime. 120 tablet 3  . triamcinolone cream (KENALOG) 0.1 % Apply 1 application topically 2 (two) times daily as needed.     . venlafaxine XR (EFFEXOR-XR) 37.5 MG 24 hr capsule Take 1 capsule (37.5 mg total) by mouth daily. 90 capsule 3  . zolpidem (AMBIEN CR) 12.5 MG CR tablet Take 1 tablet (12.5 mg total) by mouth at bedtime. 30 tablet 1  . dexlansoprazole (DEXILANT) 60 MG capsule Take 60 mg by mouth daily.    Marland Kitchen omeprazole (PRILOSEC) 40 MG capsule   11  . omeprazole-sodium bicarbonate (ZEGERID) 40-1100 MG per capsule Take 1 capsule by mouth 2 (two) times daily.     . QUEtiapine (SEROQUEL) 25 MG tablet   0   No current facility-administered medications for this visit.    Allergies as of 06/09/2015 - Review Complete 06/09/2015  Allergen Reaction Noted  . Erythromycin  11/11/2011  . Triptans  12/09/2014    Vitals: BP 114/68 mmHg  Pulse 62  Resp 14  Ht 5\' 4"  (1.626 m)  Wt 147 lb (66.679 kg)  BMI 25.22 kg/m2  LMP 08/28/2013 (Exact Date) Last Weight:  Wt Readings from Last 1 Encounters:  06/09/15 147 lb (66.679 kg)   Last Height:   Ht Readings from Last 1 Encounters:  06/09/15  5\' 4"  (1.626 m)     Physical exam: Exam: Gen: NAD, conversant Eyes: anicteric sclerae, moist conjunctivae HENT: Atraumatic, oropharynx clear Lungs: CTA, no wheezing, rales, rhonic                           CV: RRR, no MRG Abdomen: Soft, non-tender;  Extremities: No peripheral edema  Skin: Normal temperature, no rash,  Psych: Appropriate affect, pleasant  Neuro: Vicki: AA&Ox3, appropriately interactive, normal affect - she feels drowsy.  Speech: fluent w/o paraphasic error, no aphasia no dysphonia.  Memory: good recent and remote recall CN: PERRL, EOMI no nystagmus, no ptosis, VFF bilat,  sensation intact to LT V1-V3 bilat, face symmetric, no weakness, hearing grossly intact, palate elevates symmetrically, shoulder shrug 5/5 bilat,  tongue protrudes midline, no fasiculations noted. Motor: normal bulk and tone/ Strength:5/5  In all extremities Coord: rapid alternating and point-to-point (FNF, HTS) movements intact. Reflexes: symmetrical, bilat downgoing toes Sens: LT intact in all extremities Gait: posture, stance, stride and arm-swing were normal.  Assessment:  After physical and neurologic examination, review of laboratory studies, imaging, neurophysiology testing and pre-existing records, my  25 minute assessment was for over 50% of the face to face time dedicated to s dicussion of sleep hygiene and the futility of using sleep aids long term without behavior modification. :  Plan:  Treatment plan and additional workup will be reviewed under the list:  1)Migraine without aura,   improved on pentadolex. Headaches are worse when sleep deprived. I will refill Ambien for this patient who recently switched to Ambien   But she noticed that she was more alert in daytime less groggy,  less drowsy.  Sleep deprivation also precedes many of her headaches.   2) chronic Insomnia, had 2 sleep studies, Dr. Janann Colonel referred to the [facility] and she underwent a PSG , but never got the results from him. .  ( There were no organic reasons identified, I found her study on Epic. )  3) anxiety and depression - high fatigue.  Most common cause for insomnia.  4) sleep  deprivation:  Due to job situation, needs to establish an earlier bedtime, she works 70 weekly hours and has 1.5 hours  of commuting time . 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 She was able to gain a little bit of sleep   This patient has failed Sonata, trazodone, Ambien and Ambien CR in the past, Belsomra, and Lunesta. She has tried Rozerem before Pharmacologist, amitriptyline and clonazepam.  Vicki Wilson is a pleasant 55 year old woman presenting for followup evaluation of chronic migraine headache.   She has done very well with Petadolex, a nutrional herbal supplement for [place].  She has been tried on multiple agents - through 2 courses of Botox and is due for her 3rd cycle, with minimal success. Petadolax 50mg  TID.   Insomnia treatment with Melatonin 10mg , and Ambien 12.5 CR mg for insomnia. If this is not successful, she will need a referral  to behavior health/ modification. She has been seen by a [facility] specialist, but felt no benefit. She is aware of sleep hygiene and sleep diary.   Vicki Gotschall, MD  Referral back to PCP.

## 2015-07-12 ENCOUNTER — Encounter: Payer: Self-pay | Admitting: Nurse Practitioner

## 2015-07-12 ENCOUNTER — Ambulatory Visit
Admission: RE | Admit: 2015-07-12 | Discharge: 2015-07-12 | Disposition: A | Discharge status: Home / Self Care | Payer: BLUE CROSS/BLUE SHIELD | Source: Ambulatory Visit

## 2015-07-12 ENCOUNTER — Ambulatory Visit (INDEPENDENT_AMBULATORY_CARE_PROVIDER_SITE_OTHER): Payer: BLUE CROSS/BLUE SHIELD | Admitting: Nurse Practitioner

## 2015-07-12 VITALS — BP 122/66 | HR 72 | Resp 18 | Ht 64.0 in | Wt 147.6 lb

## 2015-07-12 DIAGNOSIS — Z1231 Encounter for screening mammogram for malignant neoplasm of breast: Secondary | ICD-10-CM

## 2015-07-12 DIAGNOSIS — N951 Menopausal and female climacteric states: Secondary | ICD-10-CM

## 2015-07-12 DIAGNOSIS — Z0000 Encounter for general adult medical examination without abnormal findings: Secondary | ICD-10-CM | POA: Diagnosis not present

## 2015-07-12 DIAGNOSIS — F4321 Adjustment disorder with depressed mood: Secondary | ICD-10-CM | POA: Diagnosis not present

## 2015-07-12 DIAGNOSIS — G8929 Other chronic pain: Secondary | ICD-10-CM

## 2015-07-12 DIAGNOSIS — Z01419 Encounter for gynecological examination (general) (routine) without abnormal findings: Secondary | ICD-10-CM | POA: Diagnosis not present

## 2015-07-12 DIAGNOSIS — R51 Headache: Secondary | ICD-10-CM

## 2015-07-12 LAB — POCT URINALYSIS DIPSTICK
Bilirubin, UA: NEGATIVE
Glucose, UA: NEGATIVE
Ketones, UA: NEGATIVE
Leukocytes, UA: NEGATIVE
Nitrite, UA: NEGATIVE
RBC UA: NEGATIVE
SPEC GRAV UA: 1.015
UROBILINOGEN UA: NEGATIVE
pH, UA: 5

## 2015-07-12 NOTE — Progress Notes (Signed)
Encounter reviewed by Dr. Keyleigh Manninen Amundson C. Silva.  

## 2015-07-12 NOTE — Patient Instructions (Signed)

## 2015-07-12 NOTE — Progress Notes (Signed)
54 y.o. G0P0 Single African American Fe here for annual exam.  No further bleeding since PUS 11/2013.  Some bloating and cramping.   Headaches is still as major problem.  She thinks the major problem is lack of sleep.  She is also grieving the loss of her step father with his service a few weeks ago.  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  She does not date and not SA.  Patient's last menstrual period was 08/28/2013 (exact date).          Sexually active: No.  The current method of family planning is abstinence.    Exercising: No.  Walk occasionally  Smoker:  no  Health Maintenance: Pap:  06/27/12, WNL, Neg HR HPV MMG:  07/02/2014 BI-RADS Category 1:Negative. MM today: No results at this time Colonoscopy:  06/01/13, polyps, repeat 5 years BMD:   2005, heel test TDaP:  8/1/ 2011 Labs: pt states she hasn't had labs drawn but if needed, will get some drawn.   reports that she has never smoked. She has never used smokeless tobacco. She reports that she drinks alcohol. She reports that she does not use illicit drugs.  Past Medical History  Diagnosis Date  . Stroke ([place])   . Headache(784.0)   . Depression   . Anxiety   . Migraines   . Uterine fibroids affecting pregnancy   . IBS (irritable bowel syndrome)   . GERD (gastroesophageal reflux disease)   . PFO (patent foramen ovale)   . Abnormal Pap smear     ASCUS-08/1998  LGSIL/HPV- 01/1999    Past Surgical History  Procedure Laterality Date  . Tonsillectomy    . Bilateral inferior tubinate reduction  1998  . 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 acetaminophen (TYLENOL) 500 MG tablet Take 1,000 mg by mouth every 8 (eight) hours as needed.    Marland Kitchen CARAFATE 1 GM/10ML suspension Take 1 g by mouth as needed.     . clidinium-chlordiazePOXIDE (LIBRAX) 5-2.5 MG capsule Take 1 capsule by mouth.     . cyclobenzaprine (FLEXERIL) 5 MG tablet Take 1 tablet (5 mg total) by mouth 3 (three) times daily as needed. 60 tablet 3  . fluticasone (FLONASE) 50 MCG/ACT nasal spray Place 2 sprays into the nose daily as needed.     . gabapentin (NEURONTIN) 100 MG capsule Take 1 capsule (100 mg total) by mouth 2 (two) times daily. 60 capsule 1  . ketorolac (TORADOL) 10 MG tablet Take 1 tablet (10 mg total) by mouth every 6 (six) hours as needed. 20 tablet 0  . LINZESS 145 MCG CAPS capsule   4  . Melatonin 10 MG TABS Take 10 tablets by mouth daily. Taking  1 tab QHS    . Multiple Vitamins-Iron (MULTIVITAMINS WITH IRON) TABS Take 1 tablet by mouth daily.    Marland Kitchen omeprazole (PRILOSEC) 40 MG capsule   11  . Petasin (PETADOLEX PO) Take 50 mg by mouth 2 (two) times daily.    . promethazine (PHENERGAN) 12.5 MG tablet Take 1 tablet (12.5 mg total) by mouth every 6 (six) hours as needed for nausea or vomiting. 30 tablet 0  . propranolol (INDERAL) 20 MG tablet 40 mg at night po, for migraine prophyslaxis. 180 tablet 1  . topiramate (TOPAMAX) 25 MG tablet Take 1 tablet (25 mg total) by mouth  at bedtime. 120 tablet 3  . triamcinolone cream (KENALOG) 0.1 % Apply 1 application topically 2 (two) times daily as needed.     . venlafaxine XR (EFFEXOR-XR) 37.5 MG 24 hr capsule Take 1 capsule (37.5 mg total) by mouth daily. 90 capsule 3  . zolpidem (AMBIEN CR) 12.5 MG CR tablet Take 1 tablet (12.5 mg total) by mouth at bedtime. 30 tablet 5   No current facility-administered medications for this visit.    Family History  Problem Relation Age of Onset  . High blood pressure Mother   . Hyperlipidemia Mother   . Gout Father   . High blood pressure Father   . Diabetes Mellitus I Sister   . Other Sister     seizure disorder  . Alzheimer's disease Maternal Grandmother   . Cancer Paternal Grandmother     colon  . Parkinson's disease Maternal Grandfather   . Kidney failure Other     ROS:  Pertinent items are noted in HPI.   Otherwise, a comprehensive ROS was negative.  Exam:   BP 122/66 mmHg  Pulse 72  Resp 18  Ht 5\' 4"  (1.626 m)  Wt 147 lb 9.6 oz (66.951 kg)  BMI 25.32 kg/m2  LMP 08/28/2013 (Exact Date) Height: 5\' 4"  (162.6 cm) Ht Readings from Last 3 Encounters:  07/12/15 5\' 4"  (1.626 m)  06/09/15 5\' 4"  (1.626 m)  12/09/14 5\' 4"  (1.626 m)    General appearance: alert, cooperative and appears stated age Head: Normocephalic, without obvious abnormality, atraumatic Neck: no adenopathy, supple, symmetrical, trachea midline and thyroid normal to inspection and palpation Lungs: clear to auscultation bilaterally Breasts: normal appearance, no masses or tenderness Heart: regular rate and rhythm Abdomen: soft, non-tender; no masses,  no organomegaly Extremities: extremities normal, atraumatic, no cyanosis or edema Skin: Skin color, texture, turgor normal. No rashes or lesions Lymph nodes: Cervical, supraclavicular, and axillary nodes normal. No abnormal inguinal nodes palpated Neurologic: Grossly normal   Pelvic: External genitalia:  no lesions              Urethra:  normal appearing urethra with no masses, tenderness or lesions              Bartholin's and Skene's: normal                 Vagina: normal appearing vagina with normal color and discharge, no lesions              Cervix: anteverted              Pap taken: Yes.   Bimanual Exam:  Uterus:  normal size, contour, position, consistency, mobility, non-tender              Adnexa: no mass, fullness, tenderness               Rectovaginal: Confirms               Anus:  normal sphincter tone, no lesions  Chaperone present: yes  A:  Well Woman with normal exam  Menopausal with no VB since 08/28/13 - negative evaluation with PUS History of fibroids no size change with PUS 11/12/13 History of stroke in past with right lacunar infarct 08/2005 Remote history of remote ASCUS with negative HPV in  2003 Chronic insomnia  Migraine without aura   P:   Reviewed health and wellness pertinent to exam  Pap smear as above  Mammogram is due next year 11/17 if today's is normal  Counseled  on breast self exam, mammography screening, adequate intake of calcium and vitamin D, diet and exercise return annually or prn  An After Visit Summary was printed and given to the patient.

## 2015-07-13 ENCOUNTER — Encounter: Payer: Self-pay | Admitting: *Deleted

## 2015-07-18 NOTE — Addendum Note (Signed)
Addended by: Antonietta Barcelona on: 07/18/2015 08:15 AM   Modules accepted: Orders

## 2015-07-20 ENCOUNTER — Telehealth: Payer: Self-pay | Admitting: Neurology

## 2015-07-20 NOTE — Telephone Encounter (Signed)
Called pt to find out what she decided. No answer, left a message asking that she call me back.

## 2015-07-20 NOTE — Telephone Encounter (Signed)
Pt said that she has not had a chance to call [facility] and find out if it is possible to have her infusion done there. 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

## 2015-07-20 NOTE — Telephone Encounter (Signed)
Returned pt's phone call. No answer, left a message asking her to call me back.  Which migraine medication is she taking? She is on inderal, topamax, phenergan. What does she want [place] to do for her?

## 2015-07-20 NOTE — Telephone Encounter (Signed)
Pt called for sts 5 days she has been having migraine behind rt eye, rt side of head when she sneeze, laugh, cough or bend over. She has been taking sinus medication, flonase and migraine medication but it is not helping. She does feel it is a migraine. Please call and advises

## 2015-07-20 NOTE — Telephone Encounter (Signed)
Dr. Brett Fairy spoke to pt and offered an IV depakote infusion to be done at [facility]. Pt is to call [facility] and find out if this is possible and if it is, who to send the order to. Pt will call us back with information. Please skype me when pt returns call.

## 2015-07-21 NOTE — Telephone Encounter (Signed)
I LMVM for pt that we are not here on Friday's.  Hopefully she will be able to get care at [facility]. This was a courtesy call to let her know about Friday.

## 2015-07-22 LAB — IPS PAP TEST WITH HPV

## 2015-08-11 ENCOUNTER — Ambulatory Visit
Admission: RE | Admit: 2015-08-11 | Discharge: 2015-08-11 | Disposition: A | Discharge status: Home / Self Care | Payer: BLUE CROSS/BLUE SHIELD | Source: Ambulatory Visit | Attending: Family | Admitting: Family

## 2015-08-11 ENCOUNTER — Other Ambulatory Visit: Payer: Self-pay | Admitting: Family

## 2015-08-11 DIAGNOSIS — M79672 Pain in left foot: Secondary | ICD-10-CM

## 2015-08-16 ENCOUNTER — Other Ambulatory Visit: Payer: Self-pay | Admitting: Neurology

## 2015-09-21 ENCOUNTER — Telehealth: Payer: Self-pay | Admitting: Neurology

## 2015-09-21 DIAGNOSIS — G4700 Insomnia, unspecified: Secondary | ICD-10-CM

## 2015-09-21 NOTE — Telephone Encounter (Addendum)
Pt called inquiring if Dr Brett Fairy will refer her to sleep psychologist or psychiatrist as referred in telephone note 03/15/15. She also said migraines have increased in intensity and frequency over the past 2 weeks abut 4-5 x week. She left work early on 1/30 and missed work early on 1/31. Pt took torodal and phenergan on 1/30//17, HA stopped on 1/31.  She is taking gabapentin for HA prn. Pt is inquiring when she is to come back for f/u.

## 2015-09-22 NOTE — Telephone Encounter (Signed)
I returned pt's call, no answer, left a message asking her to call me back.  I spoke to Dr. Brett Fairy who recommended [facility] in [place] as a sleep psychology center and asked that the pt try that. Dr. Brett Fairy also ordered a follow up with the NP for worsening symptoms, so the pt may make an appt with the NP if needed.

## 2015-09-22 NOTE — Telephone Encounter (Signed)
Pt returned my call. I advised her that Dr. Brett Fairy recommended [facility]. I also advised her that Dr. Brett Fairy recommended follow up with an NP if her symptoms worsened, and pt is agreeable to seeing a NP, but would have to call us back to schedule.Pt is agreeable to trying them, but wants to know if Dr. Brett Fairy will continue prescribing her Lorrin Mais? She is concerned that if she goes to [place] and they do not prescribe medications and Dr. Brett Fairy does NOT want to continue prescribing the Lorrin Mais, she will have no where to get her [place]. I called [facility] and spoke to [place], who advised me that the practioners at the center are MDs or NPs and they are able to prescribe medications but the practioners would have to decide whether they will prescribe the medication or not. They do not offer Saturday hours, which is another question the pt had for me.

## 2015-09-22 NOTE — Telephone Encounter (Signed)
Spoke with Dr. Brett Fairy. She is agreeable to continuing to prescribe the [place] if [facility] will not. However, she must actually go to [facility]. [facility] does not offer Saturday hours. I called pt back to discuss. No answer, left a message asking her to call us back.

## 2015-09-23 NOTE — Telephone Encounter (Signed)
Patient returned Kristen's call, is aware Cyril Mourning is out of the office today, please call back on Monday [phone].

## 2015-09-26 NOTE — Addendum Note (Signed)
Addended by: Lester [place] A on: 09/26/2015 10:15 AM   Modules accepted: Orders

## 2015-09-26 NOTE — Telephone Encounter (Signed)
Pt called back to speak with [facility]. I relayed to her about the medication and needing to go to [facility]. She is agreeable and would like to have a referral sent. She request a late afternoon appt. Please call pt to advise [phone] may leave a message.

## 2015-09-26 NOTE — Telephone Encounter (Signed)
Spoke to pt. She is agreeable to the referral to [facility] in [place]. I advised her of no Saturday hours at that clinic. Pt verbalized understanding. Pt made an appt with Jinny Blossom, NP to discuss worsening of migraines.

## 2015-09-28 ENCOUNTER — Ambulatory Visit (INDEPENDENT_AMBULATORY_CARE_PROVIDER_SITE_OTHER): Payer: BLUE CROSS/BLUE SHIELD | Admitting: Adult Health

## 2015-09-28 ENCOUNTER — Encounter: Payer: Self-pay | Admitting: Adult Health

## 2015-09-28 VITALS — BP 115/84 | HR 64 | Ht 64.0 in | Wt 144.0 lb

## 2015-09-28 DIAGNOSIS — G43019 Migraine without aura, intractable, without status migrainosus: Secondary | ICD-10-CM | POA: Diagnosis not present

## 2015-09-28 MED ORDER — TOPIRAMATE 25 MG PO TABS: 25.0000 mg | ORAL_TABLET | Freq: Two times a day (BID) | ORAL | Status: DC

## 2015-09-28 NOTE — Patient Instructions (Signed)
Increase Topamax to 50 mg daily  If your symptoms worsen or you develop new symptoms please let us know.

## 2015-09-28 NOTE — Progress Notes (Signed)
PATIENT: Vicki Wilson DOB: 1960/12/25  REASON FOR VISIT: follow up- migraine headaches HISTORY FROM: patient  HISTORY OF PRESENT ILLNESS: [name] is a 55 year old female with a history of migraine headaches. She returns today for follow-up. She states in the last 3-4 weeks her headaches have increased. She states that she can have a migraine 4-5 times a week. Initially her headaches was responding well to gabapentin and Tylenol but now she states that it does not work as well. She states that she had a migraine on Friday that lasted till Saturday evening. Her headaches are normally located in the right temporal region and sometimes can radiate down the right side of the neck. She occasionally will have photophobia and phonophobia as well as nausea but no vomiting. Denies having an aura or any visual disturbances. The patient has been on several medications. She has tried Botox in the past with minimal benefit. She was unable to tolerate triptan medication due to muscle aches. The patient is on Flexeril but only uses this medication as needed. She states that she will use Flexeril only if the headache radiates to her neck. She uses gabapentin as needed. She states that she cannot tolerate gabapentin daily due to blurred vision. She uses Toradol and Phenergan for only severe headaches. She states that she can only take this medication when she is able to stay at home due to drowsiness. The patient is currently on propranolol 20 mg twice a day. She is on Topamax 1-1/2 tablets at bedtime. She continues to take Effexor daily. She has not tried any tricyclic antidepressants. She returns today for an evaluation.  HISTORY  Per old records with Dr Rexene Alberts and Dr Erling Cruz: In summary, Vicki Wilson is a very pleasant 55 y.o.-year old female previously followed by Dr. Erling Cruz with a history of migraine without aura. She has been tried on multiple different abortive medications and has tried preventative  medications in the form of imipramine and Topamax as well as Effexor or in the past and is currently on low-dose Effexor and low-dose propranolol. I would like to suggest a cautious increase in her propranolol to 10 mg 3 times a day. On 40 mg daily she had diarrhea in the past. We will have to see how it goes. I had a long chat with the patient regarding her symptoms. Her exam is nonfocal and I reassured her in that regard. She is advised to continue using Flexeril and over-the-counter anti-inflammatory as needed for abortive treatment. She had side effects with several triptans in the past. She is encouraged to followup with her primary care physician for her chronic insomnia. I did give her a prescription for propranolol 10 mg 3 times a day with refills. I suggested a six-month followup with one of my colleagues for her chronic migraines. I answered all her questions today and she is encouraged to call with any interim problems. She was in agreement. This is her history : PS. D.O.  Continues to have difficulty with sleep. Has difficulty falling asleep and staying asleep, typically is getting around 4 hours of sleep a night. Currently is taking the Ambien CR and Melatonin nightly. Has tried Belsomra in the past but did not notice any benefit. Has had a sleep study completed since last visit which was unremarkable.  Continues to have difficulty with headaches. Typically will have a headache around 50% of the days of the month. Currently taking Inderal 20mg  BID, riboflavin 400 and Magnesium 400mg . Similar headaches  to prior visit.  Otherwise no acute symptoms. Remains stressed due to work and lack of sleep.  Has tried Ambien, Ambien CR, Lunesta, Sonata, Pamelor, Trazodone, Elavil. Currently taking Ambien CR and Melatonin 10mg  nightly. Has not gotten much benefit from this regimen.   Interval history :  08-19-13 , Vicki Wilson presents today for the first time to me in a face-to-face visit with a history of  migraines and chronic insomnia that follows a cyclic  Her migraines had much improved after she started butterburr . Her insomnia however has not responded to multiple prescription agents in the past. She has suffered from insomnia for at least 21 years in a cyclic pattern and after melatonin and Belsomra the last 2 agents she tried failed in combination she is now returned to Ambien CR 12.5 mg at night but she also does take melatonin an hour prior to bedtime with this. She also takes when necessary Flexeril and takes Neurontin which should promote sleep as well. Her medication list has been reviewed prior to this visit.  Sleep habits the patient usually goes to bed between 11 and 11:30 PM, even sleep aids she may lie awake until 3 or 3:30 AM before she can first time initiate sleep. Once asleep she will stay asleep for about 4-1/2 hours. She rarely has nocturia and usually does not drink liquids prior to bedtime. She does not report nocturnal palpitations or nightmares, she does not report nocturnal hot flashes also she may suffer some in daytime.  She drinks rarely caffeine. She does not nap in daytime except on week ends, She works 2 jobs .  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 She has early morning wakefulness. She is not refreshed in AM ,she does rely on an alarm.    Interval history from 06-09-15 : Shortly after our January visit the patient needed an additional dose of Phenergan and Toradol to break her HA, this helped her to sleep, to relax and actually broke the headache cycle. I would like to discuss with her to prescribe these 2 medications in a oral form so that she may avoid having to go to the emergency room or urgent care at headache onset. Some weeks she will have one or 2 headaches and other weeks she may have none at all which is an improvement. She goes to bed at 10:30  or 11 falls asleep but wakes up at 4 AM, after 4 she may remain dozing in bed but feels that this has no restorative or refreshing quality at all. She feels actually more tired if she doesn't rise. Her 2 job situaion is changed , insomnia responded to Seroquel and Melatonin but doesn't like the grogginess. She tried again Ambien and felt more energetic, less groggy. She endorsed today the Epworth sleepiness score at 13 points which makes him more sleepy than in last visit the fatigue severity score at 43 points.     REVIEW OF SYSTEMS: Out of a complete 14 system review of symptoms, the patient complains only of the following symptoms, and all other reviewed systems are negative.  Headache, insomnia  ALLERGIES: Allergies  Allergen Reactions  . Erythromycin     GI upset  . Triptans     HOME MEDICATIONS: Outpatient Prescriptions Prior to Visit  Medication Sig Dispense Refill  . acetaminophen (TYLENOL) 500 MG tablet Take 1,000 mg by mouth every 8 (eight) hours  as needed.    Marland Kitchen CARAFATE 1 GM/10ML suspension Take 1 g by mouth as needed.     . clidinium-chlordiazePOXIDE (LIBRAX) 5-2.5 MG capsule Take 1 capsule by mouth.    . cyclobenzaprine (FLEXERIL) 5 MG tablet Take 1 tablet (5 mg total) by mouth 3 (three) times daily as needed. 60 tablet 3  . fluticasone (FLONASE) 50 MCG/ACT nasal spray Place 2 sprays into the nose daily as needed.     . gabapentin (NEURONTIN) 100 MG capsule Take 1 capsule (100 mg total) by mouth 2 (two) times daily. 60 capsule 1  . ketorolac (TORADOL) 10 MG tablet Take 1 tablet (10 mg total) by mouth every 6 (six) hours as needed. 20 tablet 0  . LINZESS 145 MCG CAPS capsule Take 145 mcg by mouth daily.   4  . Melatonin 10 MG TABS Take 10 tablets by mouth daily. Taking  1 tab QHS    . Multiple Vitamins-Iron (MULTIVITAMINS WITH IRON) TABS Take 1 tablet by mouth daily.    Marland Kitchen omeprazole (PRILOSEC) 40 MG capsule Take 40 mg by mouth daily.   11  . Petasin (PETADOLEX PO) Take 50  mg by mouth 2 (two) times daily.    . promethazine (PHENERGAN) 12.5 MG tablet Take 1 tablet (12.5 mg total) by mouth every 6 (six) hours as needed for nausea or vomiting. 30 tablet 0  . propranolol (INDERAL) 20 MG tablet 40 mg at night po, for migraine prophyslaxis. 180 tablet 1  . topiramate (TOPAMAX) 25 MG tablet TAKE 1 TABLET BY MOUTH AT BEDTIME INCREASE 25MG  WEEKLY AS TOLERATED (Patient taking differently: TAKE 1 TABLET BY MOUTH AT BEDTIME INCREASE 25MG  WEEKLY AS TOLERATED.  taking 1 1/2 tabs po qhs) 120 tablet 6  . triamcinolone cream (KENALOG) 0.1 % Apply 1 application topically 2 (two) times daily as needed.     . venlafaxine XR (EFFEXOR-XR) 37.5 MG 24 hr capsule Take 1 capsule (37.5 mg total) by mouth daily. 90 capsule 3  . zolpidem (AMBIEN CR) 12.5 MG CR tablet Take 1 tablet (12.5 mg total) by mouth at bedtime. 30 tablet 5   No facility-administered medications prior to visit.    PAST MEDICAL HISTORY: Past Medical History  Diagnosis Date  . Stroke ([place])   . Headache(784.0)   . Depression   . Anxiety   . Migraines   . Uterine fibroids affecting pregnancy   . IBS (irritable bowel syndrome)   . GERD (gastroesophageal reflux disease)   . PFO (patent foramen ovale)   . Abnormal Pap smear     ASCUS-08/1998  LGSIL/HPV- 01/1999    PAST SURGICAL HISTORY: Past Surgical History  Procedure Laterality Date  . Tonsillectomy    . Bilateral inferior tubinate reduction  1998  . Epicondylitis Right 05/2009    with ulnar nerve decompression     FAMILY HISTORY: Family History  Problem Relation Age of Onset  . High blood pressure Mother   . Hyperlipidemia Mother   . Gout Father   . High blood pressure Father   . Diabetes Mellitus I Sister   . Other Sister     seizure disorder  . Alzheimer's disease Maternal Grandmother   . Cancer Paternal Grandmother     colon  . Parkinson's disease Maternal Grandfather   . Kidney failure Other     SOCIAL HISTORY: Social History   Social  History  . Marital Status: Single    Spouse Name: N/A  . Number of Children: 0  . Years of  Education: 2 Masters   Occupational History  . PHYSICIAN ASSISTANT    Social History Main Topics  . Smoking status: Never Smoker   . Smokeless tobacco: Never Used  . Alcohol Use: Yes     Comment: 5 oz of white wine every 3-6 months   . Drug Use: No  . Sexual Activity: No   Other Topics Concern  . Not on file   Social History Narrative   Patient is single no children, right handed,    Education level is 2 Master's degree   Caffeine consumption is 0      PHYSICAL EXAM  Filed Vitals:   09/28/15 1313  BP: 115/84  Pulse: 64  Height: 5\' 4"  (1.626 m)  Weight: 144 lb (65.318 kg)   Body mass index is 24.71 kg/(m^2).  Generalized: Well developed, in no acute distress   Neurological examination  Mentation: Alert oriented to time, place, history taking. Follows all commands speech and language fluent Cranial nerve II-XII: Pupils were equal round reactive to light. Extraocular movements were full, visual field were full on confrontational test. Facial sensation and strength were normal. Uvula tongue midline. Head turning and shoulder shrug  were normal and symmetric. Motor: The motor testing reveals 5 over 5 strength of all 4 extremities. Good symmetric motor tone is noted throughout.  Sensory: Sensory testing is intact to soft touch on all 4 extremities. No evidence of extinction is noted.  Coordination: Cerebellar testing reveals good finger-nose-finger and heel-to-shin bilaterally.  Gait and station: Gait is normal. Tandem gait is normal. Romberg is negative. No drift is seen.  Reflexes: Deep tendon reflexes are symmetric and normal bilaterally.   DIAGNOSTIC DATA (LABS, IMAGING, TESTING) - I reviewed patient records, labs, notes, testing and imaging myself where available.      ASSESSMENT AND PLAN 55 y.o. year old female  has a past medical history of Stroke ([place]);  Headache(784.0); Depression; Anxiety; Migraines; Uterine fibroids affecting pregnancy; IBS (irritable bowel syndrome); GERD (gastroesophageal reflux disease); PFO (patent foramen ovale); and Abnormal Pap smear. here with:  1. Migraine headaches  The patient's migraine frequency has increased in the last month. She will increase Topamax to 50 mg daily. She can either take this medication at night or split her dose taking 25 mg twice a day. Patient advised that if her headache frequency or severity does not improve she should let us know. She will follow-up in 3-4 months or sooner if needed.  Ward Givens, MSN, NP-C 09/28/2015, 1:25 PM [facility] [address], [place], [place] [address] [phone]

## 2015-09-28 NOTE — Progress Notes (Signed)
I agree with the assessment and plan as directed by NP .The patient is known to me .   Larrisha Babineau, MD  

## 2015-10-08 ENCOUNTER — Other Ambulatory Visit: Payer: Self-pay | Admitting: Neurology

## 2015-11-02 ENCOUNTER — Telehealth: Payer: Self-pay | Admitting: Neurology

## 2015-11-02 NOTE — Telephone Encounter (Signed)
error 

## 2015-11-15 DIAGNOSIS — H5203 Hypermetropia, bilateral: Secondary | ICD-10-CM | POA: Insufficient documentation

## 2015-11-15 DIAGNOSIS — H16223 Keratoconjunctivitis sicca, not specified as Sjogren's, bilateral: Secondary | ICD-10-CM | POA: Insufficient documentation

## 2015-11-15 DIAGNOSIS — H40022 Open angle with borderline findings, high risk, left eye: Secondary | ICD-10-CM | POA: Insufficient documentation

## 2015-11-15 DIAGNOSIS — H1045 Other chronic allergic conjunctivitis: Secondary | ICD-10-CM | POA: Insufficient documentation

## 2015-12-09 ENCOUNTER — Encounter: Payer: Self-pay | Admitting: *Deleted

## 2015-12-09 ENCOUNTER — Other Ambulatory Visit: Payer: Self-pay | Admitting: Neurology

## 2015-12-09 NOTE — Progress Notes (Signed)
Faxed printed rx ambien to pt pharmacy. Received confirmation.

## 2015-12-27 ENCOUNTER — Telehealth: Payer: Self-pay | Admitting: Adult Health

## 2015-12-27 NOTE — Telephone Encounter (Signed)
Patient's voice mail was returned in regard to changing the 01/02/16 appt to a later time.  No answer.  Left message for callback.(called twice)

## 2015-12-29 ENCOUNTER — Telehealth: Payer: Self-pay | Admitting: *Deleted

## 2015-12-29 NOTE — Telephone Encounter (Signed)
------------------------------------------------------------   [place], [place] [facility]             CID [id number]  Patient SAME                 Pt's [facility]     Area Code 336 Phone# [phone] * DOB 2060-10-18    RE NEEDS TO CONFIRM APPT ON 5/22 AT 8AM                                                                   Disp:Y/N N If Y = C/B If No Response In 49minutes ============================================================

## 2016-01-02 ENCOUNTER — Ambulatory Visit (INDEPENDENT_AMBULATORY_CARE_PROVIDER_SITE_OTHER): Payer: BLUE CROSS/BLUE SHIELD | Admitting: Adult Health

## 2016-01-02 ENCOUNTER — Encounter: Payer: Self-pay | Admitting: Adult Health

## 2016-01-02 VITALS — BP 96/70 | HR 79 | Ht 64.0 in | Wt 142.2 lb

## 2016-01-02 DIAGNOSIS — G4700 Insomnia, unspecified: Secondary | ICD-10-CM | POA: Diagnosis not present

## 2016-01-02 DIAGNOSIS — G43009 Migraine without aura, not intractable, without status migrainosus: Secondary | ICD-10-CM

## 2016-01-02 MED ORDER — TOPIRAMATE 25 MG PO TABS: 75.0000 mg | ORAL_TABLET | Freq: Every day | ORAL | Status: DC

## 2016-01-02 MED ORDER — ZOLPIDEM TARTRATE ER 12.5 MG PO TBCR: EXTENDED_RELEASE_TABLET | ORAL | Status: DC

## 2016-01-02 NOTE — Patient Instructions (Signed)
Increase topamax to 75 mg at bedtime If your symptoms worsen or you develop new symptoms please let us know.

## 2016-01-02 NOTE — Progress Notes (Signed)
I agree with the assessment and plan as directed by NP .The patient is known to me .   Teancum Brule, MD  

## 2016-01-02 NOTE — Progress Notes (Signed)
PATIENT: Vicki Wilson DOB: Oct 07, 1960  REASON FOR VISIT: follow up-  migraine headache HISTORY FROM: patient  HISTORY OF PRESENT ILLNESS: Vicki Wilson is a 55 year old female with a history of migraine headaches. She returns today for follow-up. She states that her migraines have improved since she increased Topamax. She states that she has an average of 2 headaches a month. Typically her migraines last at least 3 days. Her headaches are always located on the right side of the head. She does have photophobia but denies phonophobia. She will occasionally have nausea but no vomiting. She denies an aura. She states that when she does have a severe headache she will take gabapentin with 1000 mg of Tylenol. She has tried taking gabapentin daily however it caused blurred vision. In the past she has tried triprans and Cambia with no benefit. The patient reports that she does have an incidental finding of a small stroke. She denies any new neurological symptoms. She returns today for an evaluation.  HISTORY 09/28/15: Vicki Wilson is a 55 year old female with a history of migraine headaches. She returns today for follow-up. She states in the last 3-4 weeks her headaches have increased. She states that she can have a migraine 4-5 times a week. Initially her headaches was responding well to gabapentin and Tylenol but now she states that it does not work as well. She states that she had a migraine on Friday that lasted till Saturday evening. Her headaches are normally located in the right temporal region and sometimes can radiate down the right side of the neck. She occasionally will have photophobia and phonophobia as well as nausea but no vomiting. Denies having an aura or any visual disturbances. The patient has been on several medications. She has tried Botox in the past with minimal benefit. She was unable to tolerate triptan medication due to muscle aches. The patient is on Flexeril but only uses this medication  as needed. She states that she will use Flexeril only if the headache radiates to her neck. She uses gabapentin as needed. She states that she cannot tolerate gabapentin daily due to blurred vision. She uses Toradol and Phenergan for only severe headaches. She states that she can only take this medication when she is able to stay at home due to drowsiness. The patient is currently on propranolol 20 mg twice a day. She is on Topamax 1-1/2 tablets at bedtime. She continues to take Effexor daily. She has not tried any tricyclic antidepressants. She returns today for an evaluation.  HISTORY Per old records with Dr Rexene Alberts and Dr Erling Cruz: In summary, Vicki Wilson is a very pleasant 55 y.o.-year old female previously followed by Dr. Erling Cruz with a history of migraine without aura. She has been tried on multiple different abortive medications and has tried preventative medications in the form of imipramine and Topamax as well as Effexor or in the past and is currently on low-dose Effexor and low-dose propranolol. I would like to suggest a cautious increase in her propranolol to 10 mg 3 times a day. On 40 mg daily she had diarrhea in the past. We will have to see how it goes. I had a long chat with the patient regarding her symptoms. Her exam is nonfocal and I reassured her in that regard. She is advised to continue using Flexeril and over-the-counter anti-inflammatory as needed for abortive treatment. She had side effects with several triptans in the past. She is encouraged to followup with her primary care physician  for her chronic insomnia. I did give her a prescription for propranolol 10 mg 3 times a day with refills. I suggested a six-month followup with one of my colleagues for her chronic migraines. I answered all her questions today and she is encouraged to call with any interim problems. She was in agreement. This is her history : PS. D.O.  Continues to have difficulty with sleep. Has difficulty falling asleep  and staying asleep, typically is getting around 4 hours of sleep a night. Currently is taking the Ambien CR and Melatonin nightly. Has tried Belsomra in the past but did not notice any benefit. Has had a sleep study completed since last visit which was unremarkable.  Continues to have difficulty with headaches. Typically will have a headache around 50% of the days of the month. Currently taking Inderal 20mg  BID, riboflavin 400 and Magnesium 400mg . Similar headaches to prior visit.  Otherwise no acute symptoms. Remains stressed due to work and lack of sleep.  Has tried Ambien, Ambien CR, Lunesta, Sonata, Pamelor, Trazodone, Elavil. Currently taking Ambien CR and Melatonin 10mg  nightly. Has not gotten much benefit from this regimen.   Interval history :  08-19-13 , Vicki Wilson presents today for the first time to me in a face-to-face visit with a history of migraines and chronic insomnia that follows a cyclic  Her migraines had much improved after she started butterburr . Her insomnia however has not responded to multiple prescription agents in the past. She has suffered from insomnia for at least 21 years in a cyclic pattern and after melatonin and Belsomra the last 2 agents she tried failed in combination she is now returned to Ambien CR 12.5 mg at night but she also does take melatonin an hour prior to bedtime with this. She also takes when necessary Flexeril and takes Neurontin which should promote sleep as well. Her medication list has been reviewed prior to this visit.  Sleep habits the patient usually goes to bed between 11 and 11:30 PM, even sleep aids she may lie awake until 3 or 3:30 AM before she can first time initiate sleep. Once asleep she will stay asleep for about 4-1/2 hours. She rarely has nocturia and usually does not drink liquids prior to bedtime. She does not report nocturnal palpitations or nightmares, she does not report nocturnal hot flashes also she may suffer some in daytime.   She drinks rarely caffeine. She does not nap in daytime except on week ends, She works 2 jobs .  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 She has early morning wakefulness. She is not refreshed in AM ,she does rely on an alarm.    Interval history from 06-09-15 : Shortly after our January visit the patient needed an additional dose of Phenergan and Toradol to break her HA, this helped her to sleep, to relax and actually broke the headache cycle. I would like to discuss with her to prescribe these 2 medications in a oral form so that she may avoid having to go to the emergency room or urgent care at headache onset. Some weeks she will have one or 2 headaches and other weeks she may have none at all which is an improvement. She goes to bed at 10:30 or 11 falls asleep but wakes up at 4 AM, after 4 she may remain dozing in bed but feels that this has no restorative or refreshing  quality at all. She feels actually more tired if she doesn't rise. Her 2 job situaion is changed , insomnia responded to Seroquel and Melatonin but doesn't like the grogginess. She tried again Ambien and felt more energetic, less groggy. She endorsed today the Epworth sleepiness score at 13 points which makes him more sleepy than in last visit the fatigue severity score at 43 points.    REVIEW OF SYSTEMS: Out of a complete 14 system review of symptoms, the patient complains only of the following symptoms, and all other reviewed systems are negative.  Headache, insomnia  ALLERGIES: Allergies  Allergen Reactions  . Erythromycin     GI upset  . Triptans     myalgias    HOME MEDICATIONS: Outpatient Prescriptions Prior to Visit  Medication Sig Dispense Refill  . acetaminophen (TYLENOL) 500 MG tablet Take 1,000 mg by mouth every 8 (eight) hours as needed.    Marland Kitchen CARAFATE 1 GM/10ML suspension Take 1 g by mouth  as needed.     . clidinium-chlordiazePOXIDE (LIBRAX) 5-2.5 MG capsule Take 1 capsule by mouth.    . cyclobenzaprine (FLEXERIL) 5 MG tablet Take 1 tablet (5 mg total) by mouth 3 (three) times daily as needed. 60 tablet 3  . fluticasone (FLONASE) 50 MCG/ACT nasal spray Place 2 sprays into the nose daily as needed.     . gabapentin (NEURONTIN) 100 MG capsule Take 1 capsule (100 mg total) by mouth 2 (two) times daily. (Patient taking differently: Take 100 mg by mouth 2 (two) times daily as needed. ) 60 capsule 1  . ketorolac (TORADOL) 10 MG tablet Take 1 tablet (10 mg total) by mouth every 6 (six) hours as needed. 20 tablet 0  . LINZESS 145 MCG CAPS capsule Take 145 mcg by mouth daily.   4  . Melatonin 10 MG TABS Take 10 tablets by mouth daily. Taking  1 tab QHS    . Multiple Vitamins-Iron (MULTIVITAMINS WITH IRON) TABS Take 1 tablet by mouth daily.    Marland Kitchen omeprazole (PRILOSEC) 40 MG capsule Take 40 mg by mouth daily.   11  . promethazine (PHENERGAN) 12.5 MG tablet Take 1 tablet (12.5 mg total) by mouth every 6 (six) hours as needed for nausea or vomiting. 30 tablet 0  . propranolol (INDERAL) 20 MG tablet TAKE 2 TABLETS BY MOUTH AT BEDTIME FOR MIGRAINE PROPHYSLAXIS 180 tablet 2  . triamcinolone cream (KENALOG) 0.1 % Apply 1 application topically 2 (two) times daily as needed.     . venlafaxine XR (EFFEXOR-XR) 37.5 MG 24 hr capsule Take 1 capsule (37.5 mg total) by mouth daily. 90 capsule 3  . topiramate (TOPAMAX) 25 MG tablet Take 1 tablet (25 mg total) by mouth 2 (two) times daily. 180 tablet 3  . zolpidem (AMBIEN CR) 12.5 MG CR tablet TAKE 1 TAB BY MOUTH AT BEDTIME 30 tablet 0  . Petasin (PETADOLEX PO) Take 50 mg by mouth 2 (two) times daily. Reported on 01/02/2016     No facility-administered medications prior to visit.    PAST MEDICAL HISTORY: Past Medical History  Diagnosis Date  . Stroke ([place])   . Headache(784.0)   . Depression   . Anxiety   . Migraines   . Uterine fibroids affecting  pregnancy   . IBS (irritable bowel syndrome)   . GERD (gastroesophageal reflux disease)   . PFO (patent foramen ovale)   . Abnormal Pap smear     ASCUS-08/1998  LGSIL/HPV- 01/1999    PAST SURGICAL HISTORY: Past  Surgical History  Procedure Laterality Date  . Tonsillectomy    . Bilateral inferior tubinate reduction  1998  . Epicondylitis Right 05/2009    with ulnar nerve decompression     FAMILY HISTORY: Family History  Problem Relation Age of Onset  . High blood pressure Mother   . Hyperlipidemia Mother   . Gout Father   . High blood pressure Father   . Diabetes Mellitus I Sister   . Other Sister     seizure disorder  . Alzheimer's disease Maternal Grandmother   . Cancer Paternal Grandmother     colon  . Parkinson's disease Maternal Grandfather   . Kidney failure Other     SOCIAL HISTORY: Social History   Social History  . Marital Status: Single    Spouse Name: N/A  . Number of Children: 0  . Years of Education: 2 Masters   Occupational History  . PHYSICIAN ASSISTANT    Social History Main Topics  . Smoking status: Never Smoker   . Smokeless tobacco: Never Used  . Alcohol Use: Yes     Comment: 5 oz of white wine every 3-6 months   . Drug Use: No  . Sexual Activity: No   Other Topics Concern  . Not on file   Social History Narrative   Patient is single no children, right handed,    Education level is 2 Master's degree   Caffeine consumption is 0      PHYSICAL EXAM  Filed Vitals:   01/02/16 0801  BP: 96/70  Pulse: 79  Height: 5\' 4"  (1.626 m)  Weight: 142 lb 3.2 oz (64.501 kg)   Body mass index is 24.4 kg/(m^2).  Generalized: Well developed, in no acute distress   Neurological examination  Mentation: Alert oriented to time, place, history taking. Follows all commands speech and language fluent Cranial nerve II-XII: Pupils were equal round reactive to light. Extraocular movements were full, visual field were full on confrontational test. Facial  sensation and strength were normal. Uvula tongue midline. Head turning and shoulder shrug  were normal and symmetric. Motor: The motor testing reveals 5 over 5 strength of all 4 extremities. Good symmetric motor tone is noted throughout.  Sensory: Sensory testing is intact to soft touch on all 4 extremities. No evidence of extinction is noted.  Coordination: Cerebellar testing reveals good finger-nose-finger and heel-to-shin bilaterally.  Gait and station: Gait is normal. Tandem gait is normal. Romberg is negative. No drift is seen.  Reflexes: Deep tendon reflexes are symmetric and normal bilaterally.   DIAGNOSTIC DATA (LABS, IMAGING, TESTING) - I reviewed patient records, labs, notes, testing and imaging myself where available.      ASSESSMENT AND PLAN 55 y.o. year old female  has a past medical history of Stroke ([place]); Headache(784.0); Depression; Anxiety; Migraines; Uterine fibroids affecting pregnancy; IBS (irritable bowel syndrome); GERD (gastroesophageal reflux disease); PFO (patent foramen ovale); and Abnormal Pap smear. here with:  1. Migraine headaches 2. Insomnia  The patient's migraines have improved with Topamax. We will increase Topamax to 75 mg daily. The patient has tried several abortive therapies in the past with no benefit. She will continue using gabapentin as needed. I will refill her Ambien today. Patient advised that if her symptoms worsen or she develops any new symptoms she should let us know.     Ward Givens, MSN, NP-C 01/02/2016, 9:12 AM [facility] [address], [place], [place] [address] [phone]

## 2016-01-18 ENCOUNTER — Telehealth: Payer: Self-pay | Admitting: Neurology

## 2016-01-18 NOTE — Telephone Encounter (Signed)
[facility] [phone] called to request most recent labs. Fax# [phone]. Is aware Vicki Wilson/Medical Records is out of the office until tomorrow.

## 2016-01-20 ENCOUNTER — Telehealth: Payer: Self-pay | Admitting: *Deleted

## 2016-01-20 NOTE — Telephone Encounter (Addendum)
I called, [place] to let her know pt, didn't have any labs here.

## 2016-01-20 NOTE — Telephone Encounter (Signed)
error 

## 2016-05-07 ENCOUNTER — Telehealth: Payer: Self-pay | Admitting: Neurology

## 2016-05-07 ENCOUNTER — Telehealth: Payer: Self-pay | Admitting: Adult Health

## 2016-05-07 NOTE — Telephone Encounter (Signed)
LFt vm for patient about having headache for 15 days. Pt has appt with Megan(NP) on Wednesday for the headache issue. Rn left vm that if her headache gets bad with vision and is very painful seek the ED.

## 2016-05-07 NOTE — Telephone Encounter (Signed)
Pt called said she has had a migraine for the past 15 days and the gabapentin is not helping. She has been taking 2-3 day and tylenol every 8 hrs. She said it got so bad on Friday she took a Toradol and it resolved it but returned on Sunday. An appt is scheduled for 9/27. Please touch base with her today

## 2016-05-07 NOTE — Telephone Encounter (Signed)
error 

## 2016-05-09 ENCOUNTER — Encounter: Payer: Self-pay | Admitting: Adult Health

## 2016-05-09 ENCOUNTER — Ambulatory Visit (INDEPENDENT_AMBULATORY_CARE_PROVIDER_SITE_OTHER): Payer: BLUE CROSS/BLUE SHIELD | Admitting: Adult Health

## 2016-05-09 VITALS — BP 120/83 | HR 61 | Wt 146.0 lb

## 2016-05-09 DIAGNOSIS — G43019 Migraine without aura, intractable, without status migrainosus: Secondary | ICD-10-CM

## 2016-05-09 MED ORDER — KETOROLAC TROMETHAMINE 10 MG PO TABS: 10.0000 mg | ORAL_TABLET | Freq: Four times a day (QID) | ORAL | 0 refills | Status: DC | PRN

## 2016-05-09 MED ORDER — TOPIRAMATE 100 MG PO TABS: 100.0000 mg | ORAL_TABLET | Freq: Every day | ORAL | 11 refills | Status: DC

## 2016-05-09 NOTE — Progress Notes (Signed)
PATIENT: Vicki Wilson DOB: [birth year]  REASON FOR VISIT: follow up- migraine headaches HISTORY FROM: patient  HISTORY OF PRESENT ILLNESS: Vicki Wilson is a 55 year old female with a history of migraine headaches. She returns today for follow-up. She states that her headaches have gotten slightly worse in the last 2 weeks. She states that she has essentially had a daily headache for the last 2 weeks. Her last headache was on Monday. She states that her headaches always occurs on the right side of the head and most recently behind the right eye. Occasionally she'll have some pressure in the neck on the right side. She states she can normally take gabapentin with Tylenol as abortive therapy and it will work well for her however lately her headaches have not been responding to this. She states that she did take Toradol and it was helpful. She reports occasionally she will have photophobia and phonophobia. Denies nausea or vomiting. In the past she has tried Botox but could not afford this. She continues on Topamax 75 mg at bedtime returns today for an evaluation.  HISTORY Vicki Wilson is a 55 year old female with a history of migraine headaches. She returns today for follow-up. She states that her migraines have improved since she increased Topamax. She states that she has an average of 2 headaches a month. Typically her migraines last at least 3 days. Her headaches are always located on the right side of the head. She does have photophobia but denies phonophobia. She will occasionally have nausea but no vomiting. She denies an aura. She states that when she does have a severe headache she will take gabapentin with 1000 mg of Tylenol. She has tried taking gabapentin daily however it caused blurred vision. In the past she has tried triprans and Cambia with no benefit. The patient reports that she does have an incidental finding of a small stroke. She denies any new neurological symptoms. She returns today for an  evaluation.  HISTORY 09/28/15: Vicki Wilson is a 55 year old female with a history of migraine headaches. She returns today for follow-up. She states in the last 3-4 weeks her headaches have increased. She states that she can have a migraine 4-5 times a week. Initially her headaches was responding well to gabapentin and Tylenol but now she states that it does not work as well. She states that she had a migraine on Friday that lasted till Saturday evening. Her headaches are normally located in the right temporal region and sometimes can radiate down the right side of the neck. She occasionally will have photophobia and phonophobia as well as nausea but no vomiting. Denies having an aura or any visual disturbances. The patient has been on several medications. She has tried Botox in the past with minimal benefit. She was unable to tolerate triptan medication due to muscle aches. The patient is on Flexeril but only uses this medication as needed. She states that she will use Flexeril only if the headache radiates to her neck. She uses gabapentin as needed. She states that she cannot tolerate gabapentin daily due to blurred vision. She uses Toradol and Phenergan for only severe headaches. She states that she can only take this medication when she is able to stay at home due to drowsiness. The patient is currently on propranolol 20 mg twice a day. She is on Topamax 1-1/2 tablets at bedtime. She continues to take Effexor daily. She has not tried any tricyclic antidepressants. She returns today for an evaluation.  HISTORY Per  old records with Dr Rexene Alberts and Dr Erling Cruz: In summary, Vicki Wilson is a very pleasant 55 y.o.-year old female previously followed by Dr. Erling Cruz with a history of migraine without aura. She has been tried on multiple different abortive medications and has tried preventative medications in the form of imipramine and Topamax as well as Effexor or in the past and is currently on low-dose Effexor and  low-dose propranolol. I would like to suggest a cautious increase in her propranolol to 10 mg 3 times a day. On 40 mg daily she had diarrhea in the past. We will have to see how it goes. I had a long chat with the patient regarding her symptoms. Her exam is nonfocal and I reassured her in that regard. She is advised to continue using Flexeril and over-the-counter anti-inflammatory as needed for abortive treatment. She had side effects with several triptans in the past. She is encouraged to followup with her primary care physician for her chronic insomnia. I did give her a prescription for propranolol 10 mg 3 times a day with refills. I suggested a six-month followup with one of my colleagues for her chronic migraines. I answered all her questions today and she is encouraged to call with any interim problems. She was in agreement. This is her history : PS. D.O.  Continues to have difficulty with sleep. Has difficulty falling asleep and staying asleep, typically is getting around 4 hours of sleep a night. Currently is taking the Ambien CR and Melatonin nightly. Has tried Belsomra in the past but did not notice any benefit. Has had a sleep study completed since last visit which was unremarkable.  Continues to have difficulty with headaches. Typically will have a headache around 50% of the days of the month. Currently taking Inderal 20mg  BID, riboflavin 400 and Magnesium 400mg . Similar headaches to prior visit.  Otherwise no acute symptoms. Remains stressed due to work and lack of sleep.  Has tried Ambien, Ambien CR, Lunesta, Sonata, Pamelor, Trazodone, Elavil. Currently taking Ambien CR and Melatonin 10mg  nightly. Has not gotten much benefit from this regimen.   Interval history :  08-19-13 , Vicki Wilson presents today for the first time to me in a face-to-face visit with a history of migraines and chronic insomnia that follows a cyclic  Her migraines had much improved after she started butterburr .  Her insomnia however has not responded to multiple prescription agents in the past. She has suffered from insomnia for at least 21 years in a cyclic pattern and after melatonin and Belsomra the last 2 agents she tried failed in combination she is now returned to Ambien CR 12.5 mg at night but she also does take melatonin an hour prior to bedtime with this. She also takes when necessary Flexeril and takes Neurontin which should promote sleep as well. Her medication list has been reviewed prior to this visit.  Sleep habits the patient usually goes to bed between 11 and 11:30 PM, even sleep aids she may lie awake until 3 or 3:30 AM before she can first time initiate sleep. Once asleep she will stay asleep for about 4-1/2 hours. She rarely has nocturia and usually does not drink liquids prior to bedtime. She does not report nocturnal palpitations or nightmares, she does not report nocturnal hot flashes also she may suffer some in daytime.  She drinks rarely caffeine. She does not nap in daytime except on week ends, She works 2 jobs .  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 She has early morning wakefulness. She is not refreshed in AM ,she does rely on an alarm.    Interval history from 06-09-15 : Shortly after our January visit the patient needed an additional dose of Phenergan and Toradol to break her HA, this helped her to sleep, to relax and actually broke the headache cycle. I would like to discuss with her to prescribe these 2 medications in a oral form so that she may avoid having to go to the emergency room or urgent care at headache onset. Some weeks she will have one or 2 headaches and other weeks she may have none at all which is an improvement. She goes to bed at 10:30 or 11 falls asleep but wakes up at 4 AM, after 4 she may remain dozing in bed but feels that this has no restorative  or refreshing quality at all. She feels actually more tired if she doesn't rise. Her 2 job situaion is changed , insomnia responded to Seroquel and Melatonin but doesn't like the grogginess. She tried again Ambien and felt more energetic, less groggy. She endorsed today the Epworth sleepiness score at 13 points which makes him more sleepy than in last visit the fatigue severity score at 43 points.    REVIEW OF SYSTEMS: Out of a complete 14 system review of symptoms, the patient complains only of the following symptoms, and all other reviewed systems are negative.  Insomnia, frequently can, daytime sleepiness, headache  ALLERGIES: Allergies  Allergen Reactions  . Erythromycin     GI upset  . Triptans     myalgias    HOME MEDICATIONS: Outpatient Medications Prior to Visit  Medication Sig Dispense Refill  . acetaminophen (TYLENOL) 500 MG tablet Take 1,000 mg by mouth every 8 (eight) hours as needed.    Marland Kitchen CARAFATE 1 GM/10ML suspension Take 1 g by mouth as needed.     . cyclobenzaprine (FLEXERIL) 5 MG tablet Take 1 tablet (5 mg total) by mouth 3 (three) times daily as needed. 60 tablet 3  . fluticasone (FLONASE) 50 MCG/ACT nasal spray Place 2 sprays into the nose daily as needed.     . gabapentin (NEURONTIN) 100 MG capsule Take 1 capsule (100 mg total) by mouth 2 (two) times daily. (Patient taking differently: Take 100 mg by mouth 2 (two) times daily as needed. ) 60 capsule 1  . ketorolac (TORADOL) 10 MG tablet Take 1 tablet (10 mg total) by mouth every 6 (six) hours as needed. 20 tablet 0  . LINZESS 145 MCG CAPS capsule Take 145 mcg by mouth daily.   4  . Melatonin 10 MG TABS Take 10 tablets by mouth daily. Taking  1 tab QHS    . Multiple Vitamins-Iron (MULTIVITAMINS WITH IRON) TABS Take 1 tablet by mouth daily.    Marland Kitchen omeprazole (PRILOSEC) 40 MG capsule Take 40 mg by mouth daily.   11  . Petasin (PETADOLEX PO) Take 50 mg by mouth 2 (two) times daily. Reported on 01/02/2016    .  promethazine (PHENERGAN) 12.5 MG tablet Take 1 tablet (12.5 mg total) by mouth every 6 (six) hours as needed for nausea or vomiting. 30 tablet 0  . propranolol (INDERAL) 20 MG tablet TAKE 2 TABLETS BY MOUTH AT BEDTIME FOR MIGRAINE PROPHYSLAXIS 180 tablet 2  . topiramate (TOPAMAX) 25 MG tablet Take 3 tablets (75 mg total) by mouth at bedtime. [place]  tablet 3  . triamcinolone cream (KENALOG) 0.1 % Apply 1 application topically 2 (two) times daily as needed.     . venlafaxine XR (EFFEXOR-XR) 37.5 MG 24 hr capsule Take 1 capsule (37.5 mg total) by mouth daily. 90 capsule 3  . zolpidem (AMBIEN CR) 12.5 MG CR tablet TAKE 1 TAB BY MOUTH AT BEDTIME 30 tablet 5  . clidinium-chlordiazePOXIDE (LIBRAX) 5-2.5 MG capsule Take 1 capsule by mouth.     No facility-administered medications prior to visit.     PAST MEDICAL HISTORY: Past Medical History:  Diagnosis Date  . Abnormal Pap smear    ASCUS-08/1998  LGSIL/HPV- 01/1999  . Anxiety   . Depression   . GERD (gastroesophageal reflux disease)   . Headache(784.0)   . IBS (irritable bowel syndrome)   . Migraines   . PFO (patent foramen ovale)   . Stroke ([place])   . Uterine fibroids affecting pregnancy     PAST SURGICAL HISTORY: Past Surgical History:  Procedure Laterality Date  . Bilateral Inferior tubinate reduction  1998  . 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 TONSILLECTOMY      FAMILY HISTORY: Family History  Problem Relation Age of Onset  . High blood pressure Mother   . Hyperlipidemia Mother   . Gout Father   . High blood pressure Father   . Diabetes Mellitus I Sister   . Other Sister     seizure disorder  . Alzheimer's disease Maternal Grandmother   . Cancer Paternal Grandmother     colon  . Parkinson's disease Maternal Grandfather   . Kidney failure Other     SOCIAL HISTORY: Social History   Social History  . Marital status: Single    Spouse name: N/A  . Number of children: 0  . Years of education:  2 Masters   Occupational History  . [employer]   Social History Main Topics  . Smoking status: Never Smoker  . Smokeless tobacco: Never Used  . Alcohol use Yes     Comment: 5 oz of white wine every 3-6 months   . Drug use: No  . Sexual activity: No   Other Topics Concern  . Not on file   Social History Narrative   Patient is single no children, right handed,    Education level is 2 Master's degree   Caffeine consumption is 0      PHYSICAL EXAM  Vitals:   05/09/16 0924  BP: 120/83  Pulse: 61  Weight: 146 lb (66.2 kg)   Body mass index is 25.06 kg/m.  Generalized: Well developed, in no acute distress   Neurological examination  Mentation: Alert oriented to time, place, history taking. Follows all commands speech and language fluent Cranial nerve II-XII: Pupils were equal round reactive to light. Extraocular movements were full, visual field were full on confrontational test. Facial sensation and strength were normal. Uvula tongue midline. Head turning and shoulder shrug  were normal and symmetric. Motor: The motor testing reveals 5 over 5 strength of all 4 extremities. Good symmetric motor tone is noted throughout.  Sensory: Sensory testing is intact to soft touch on all 4 extremities. No evidence of extinction is noted.  Coordination: Cerebellar testing reveals good finger-nose-finger and heel-to-shin bilaterally.  Gait and station: Gait is normal. Tandem gait is normal. Romberg is negative. No drift is seen.  Reflexes: Deep tendon reflexes are symmetric and normal bilaterally.   DIAGNOSTIC DATA (LABS, IMAGING,  TESTING) - I reviewed patient records, labs, notes, testing and imaging myself where available.       ASSESSMENT AND PLAN 55 y.o. year old female  has a past medical history of Abnormal Pap smear; Anxiety; Depression; GERD (gastroesophageal reflux disease); Headache(784.0); IBS (irritable bowel syndrome);  Migraines; PFO (patent foramen ovale); Stroke [facility]); and Uterine fibroids affecting pregnancy. here with:  1. Migraine headaches  We will increase Topamax  To 100 mg at bedtime. She will continue using gabapentin as abortive therapy however if she finds that her headaches do not respond to gabapentin any longer we may consider retrying triptans. Patient advised that if she has a headache that last greater than 48 hours and does not respond to abortive therapy we can consider doing an IV infusion. She voiced understanding. She will follow-up in 3-4 months with Dr. Mechele Claude, MSN, NP-C 05/09/2016, 9:41 AM [facility] [address] [place], [place] [address] [phone]

## 2016-05-09 NOTE — Progress Notes (Signed)
I agree with the assessment and plan as directed by NP .The patient is known to me .   Shamra Bradeen, MD  

## 2016-05-09 NOTE — Patient Instructions (Addendum)
Increase Topamax to 100 mg at bedtime Continue Gabapentin Use Toradol for severe migraine If your symptoms worsen or you develop new symptoms please let us know.

## 2016-05-25 ENCOUNTER — Other Ambulatory Visit: Payer: Self-pay | Admitting: Nurse Practitioner

## 2016-05-25 ENCOUNTER — Ambulatory Visit
Admission: RE | Admit: 2016-05-25 | Discharge: 2016-05-25 | Disposition: A | Discharge status: Home / Self Care | Payer: BLUE CROSS/BLUE SHIELD | Source: Ambulatory Visit | Attending: Nurse Practitioner | Admitting: Nurse Practitioner

## 2016-05-25 DIAGNOSIS — M25512 Pain in left shoulder: Secondary | ICD-10-CM

## 2016-06-15 ENCOUNTER — Other Ambulatory Visit: Payer: Self-pay | Admitting: Nurse Practitioner

## 2016-06-15 DIAGNOSIS — Z1231 Encounter for screening mammogram for malignant neoplasm of breast: Secondary | ICD-10-CM

## 2016-06-16 ENCOUNTER — Other Ambulatory Visit: Payer: Self-pay | Admitting: Neurology

## 2016-06-16 DIAGNOSIS — G44229 Chronic tension-type headache, not intractable: Secondary | ICD-10-CM

## 2016-06-24 ENCOUNTER — Other Ambulatory Visit: Payer: Self-pay | Admitting: Neurology

## 2016-07-03 ENCOUNTER — Telehealth: Payer: Self-pay | Admitting: Nurse Practitioner

## 2016-07-03 NOTE — Telephone Encounter (Signed)
Left message to call Devonna Oboyle at [phone].  

## 2016-07-03 NOTE — Telephone Encounter (Signed)
Patient called and said, "I'd like to get a message to [place]. I have a mammogram coming up on 07/19/16.  Before I go to that, I'd like Patty to know I have been having burning pain in my left breast for the last six months to a year."  Last seen: 07/12/15.

## 2016-07-03 NOTE — Telephone Encounter (Signed)
Please have pt to come in on 07/09/16 as discussed.

## 2016-07-03 NOTE — Telephone Encounter (Signed)
Spoke with patient, advised as seen below per Patricia Grubb, NP. Patient verbalizes understanding and is agreeable.  Routing to provider for final review. Patient is agreeable to disposition. Will close encounter.  

## 2016-07-03 NOTE — Telephone Encounter (Signed)
Spoke with patient. Patient states she has been experiencing intermittent  burning in both breast for the last 6 months to year. Patient states the burning is not always in the same spot. Patient denies any tenderness, lumps, discharge. Recommended OV for further evaluation, patient states she has AEX 07/23/16 and MMG 07/19/16. Advised patient she would need further evaluation prior to MMG and would recommend being seen sooner than 07/23/16. Patient  scheduled for 07/09/16 at 4pm with Kem Boroughs, NP. Patient would like to review with Kem Boroughs, NP to see if OV necessary and return call.   Kem Boroughs, NP please advise?

## 2016-07-07 ENCOUNTER — Other Ambulatory Visit: Payer: Self-pay | Admitting: Neurology

## 2016-07-09 ENCOUNTER — Ambulatory Visit (INDEPENDENT_AMBULATORY_CARE_PROVIDER_SITE_OTHER): Payer: BLUE CROSS/BLUE SHIELD | Admitting: Nurse Practitioner

## 2016-07-09 ENCOUNTER — Encounter: Payer: Self-pay | Admitting: Nurse Practitioner

## 2016-07-09 VITALS — BP 120/76 | HR 60 | Ht 64.0 in | Wt 148.0 lb

## 2016-07-09 DIAGNOSIS — N644 Mastodynia: Secondary | ICD-10-CM | POA: Diagnosis not present

## 2016-07-09 NOTE — Progress Notes (Signed)
Spoke with Cherish at [facility]. Patient scheduled while in office for bilateral diagnostic MMG with L/R ultrasound if needed, 07/19/16 arriving at 3:20pm, appt at 3:40pm. Patient is agreeable to date and time.

## 2016-07-09 NOTE — Progress Notes (Deleted)
55 y.o. {MARITAL STATUS:22092} {Race/ethnicity:17218} female G0P0 here for follow up of ****** treated with ******* initiated on {DATE MONTH DAY YT:[id number]. Completed all medication as directed.  Denies any symptoms of**************************.  Note    Spoke with patient. Patient states she has been experiencing intermittent  burning in both breast for the last 6 months to year. Patient states the burning is not always in the same spot. Patient denies any tenderness, lumps, discharge. Recommended OV for further evaluation, patient states she has AEX 07/23/16 and MMG 07/19/16. Advised patient she would need further evaluation prior to MMG and would recommend being seen sooner than 07/23/16. Patient  scheduled for 07/09/16 at 4pm with Kem Boroughs, NP. Patient would like to review with Kem Boroughs, NP to see if OV necessary and return call.        O: Healthy WD,WN female Affect:  Skin: Abdomen:{Exam; abdomen brief:12273} Pelvic exam:{pe pelvic exam prenatal obgyn:314539}  A:****** Resolved     P: Discussed findings of   Labs   Instructions given regarding:  RV

## 2016-07-09 NOTE — Progress Notes (Signed)
   Subjective:   55 y.o. Single African American female presents for evaluation of bilateral breast tenderness and 'burning". Onset of the symptoms was over 6 months ago. Patient sought evaluation because of breast tenderness and burning.  The areas of the breast that this occurs is always not in the same spot.  She denies any increase in caffeine, no change in med's, etc.   She is postmenopausal. Contributing factors include family hx on mother's side with an aunt and 2 nd cousin with breast cancer.   Denies Constitutional Sx:Marland Kitchen Patient denies history of trauma, bites, or injuries. Last mammogram was 07/12/15 Previous evaluation has included Mammo with 3 D.   Review of Systems Pertinent items are noted in HPI.SUBJECTIVE   Objective:   General appearance: alert, cooperative and appears stated age Head: Normocephalic, without obvious abnormality, atraumatic Neck: supple, symmetrical, trachea midline and thyroid not enlarged, symmetric, no tenderness/mass/nodules Back: symmetric, no curvature. ROM normal. No CVA tenderness. Lungs: clear to auscultation bilaterally Breasts: normal appearance without mass and has bilateral tenderness.  No rash, lymphadenopathy, or color changes.  Heart: regular rate and rhythm Abdomen: normal findings: no masses palpable    Assessment:   ASSESSMENT:Patient is diagnosed with mastalgia   Plan:   PLAN: The patient has a documented plan to follow with further care of Diagnostic Mammo and [place] on 07/19/16 2. PLAN: FOLLOW as needed.

## 2016-07-09 NOTE — Patient Instructions (Signed)
We will schedule diagnostic Mammo and [place]

## 2016-07-10 ENCOUNTER — Ambulatory Visit: Payer: BLUE CROSS/BLUE SHIELD | Admitting: Neurology

## 2016-07-12 NOTE — Progress Notes (Signed)
Reviewed personally.  M. Suzanne Tige Meas, MD.  

## 2016-07-13 ENCOUNTER — Ambulatory Visit: Payer: BLUE CROSS/BLUE SHIELD | Admitting: Nurse Practitioner

## 2016-07-19 ENCOUNTER — Ambulatory Visit
Admission: RE | Admit: 2016-07-19 | Discharge: 2016-07-19 | Disposition: A | Discharge status: Home / Self Care | Payer: BLUE CROSS/BLUE SHIELD | Source: Ambulatory Visit | Attending: Nurse Practitioner | Admitting: Nurse Practitioner

## 2016-07-19 ENCOUNTER — Ambulatory Visit: Payer: BLUE CROSS/BLUE SHIELD

## 2016-07-19 DIAGNOSIS — N644 Mastodynia: Secondary | ICD-10-CM

## 2016-07-23 ENCOUNTER — Ambulatory Visit (INDEPENDENT_AMBULATORY_CARE_PROVIDER_SITE_OTHER): Payer: BLUE CROSS/BLUE SHIELD | Admitting: Neurology

## 2016-07-23 ENCOUNTER — Encounter: Payer: Self-pay | Admitting: Neurology

## 2016-07-23 ENCOUNTER — Ambulatory Visit: Payer: BLUE CROSS/BLUE SHIELD

## 2016-07-23 ENCOUNTER — Ambulatory Visit: Payer: BLUE CROSS/BLUE SHIELD | Admitting: Nurse Practitioner

## 2016-07-23 VITALS — BP 110/62 | HR 88 | Resp 16 | Ht 64.0 in | Wt 144.0 lb

## 2016-07-23 DIAGNOSIS — G43711 Chronic migraine without aura, intractable, with status migrainosus: Secondary | ICD-10-CM

## 2016-07-23 MED ORDER — PROMETHAZINE HCL 12.5 MG PO TABS: 12.5000 mg | ORAL_TABLET | Freq: Four times a day (QID) | ORAL | 0 refills | Status: AC | PRN

## 2016-07-23 MED ORDER — TOPIRAMATE 100 MG PO TABS: 150.0000 mg | ORAL_TABLET | Freq: Every day | ORAL | 2 refills | Status: DC

## 2016-07-23 MED ORDER — ZOLPIDEM TARTRATE ER 12.5 MG PO TBCR: EXTENDED_RELEASE_TABLET | ORAL | 5 refills | Status: DC

## 2016-07-23 MED ORDER — KETOROLAC TROMETHAMINE 10 MG PO TABS: 10.0000 mg | ORAL_TABLET | Freq: Four times a day (QID) | ORAL | 0 refills | Status: DC | PRN

## 2016-07-23 MED ORDER — CYCLOBENZAPRINE HCL 5 MG PO TABS: 5.0000 mg | ORAL_TABLET | Freq: Three times a day (TID) | ORAL | 3 refills | Status: DC | PRN

## 2016-07-23 NOTE — Patient Instructions (Signed)
FMLA .  I will support up to 7 health days per month for FMLA illness related absence from work. If you suffer an acute migraine at work he should be able to leave work and come to the office during office hours for an infusion of Depakote.

## 2016-07-23 NOTE — Progress Notes (Signed)
PATIENT: Vicki Wilson DOB: 01/06/1961  REASON FOR VISIT: follow up- migraine headaches HISTORY FROM: patient  HISTORY OF PRESENT ILLNESS:  12-11-2017[name] is a 55 year old female with a history of migraine headaches.  She states that her headaches have gotten slightly worse on Thanksgiving, now getting better. Phenergan and toradol helped.  She states that she has essentially had a daily headache for the week of Thanksgiving. Her last headache was on 07-22-2016. She states that her headaches always occurs on the right side of the head and most recently behind the right eye. Occasionally she'll have some pressure in the neck on the right side. She states she can normally take gabapentin with Tylenol as abortive therapy and it will work well for her.  Lately  ( since this summer ) her headaches have not been responding to this. She states that she did take Toradol and it was helpful. She reports occasionally she will have photophobia and phonophobia. Denies nausea or vomiting.  In the past she has tried Botox but could not afford this ( Dr Jaynee Eagles ) and was not sure that it helped. She continues on Topamax 100 mg at bedtime. She asked if this dose can be further increased. She is willling to under go a depakote infusion. Todays headache is not present. 7 days a month. FMLA request.  She has been  Referred to a psychologist for Insomnia. She has not followed up. Will not refill Ambien. She failed many drugs, see Dr Hazle Quant note/   HISTORY MM Vicki Wilson is a 55 year old female with a history of migraine headaches. She returns today for follow-up. She states that her migraines have improved since she increased Topamax. She states that she has an average of 2 headaches a month. Typically her migraines last at least 3 days. Her headaches are always located on the right side of the head. She does have photophobia but denies phonophobia. She will occasionally have nausea but no vomiting. She denies an  aura. She states that when she does have a severe headache she will take gabapentin with 1000 mg of Tylenol. She has tried taking gabapentin daily however it caused blurred vision. In the past she has tried triprans and Cambia with no benefit. The patient reports that she does have an incidental finding of a small stroke. She denies any new neurological symptoms. She returns today for an evaluation.  HISTORY 09/28/15: MMMs. Wilson is a 55 year old female with a history of migraine headaches. She returns today for follow-up. She states in the last 3-4 weeks her headaches have increased. She states that she can have a migraine 4-5 times a week. Initially her headaches was responding well to gabapentin and Tylenol but now she states that it does not work as well. She states that she had a migraine on Friday that lasted till Saturday evening. Her headaches are normally located in the right temporal region and sometimes can radiate down the right side of the neck. She occasionally will have photophobia and phonophobia as well as nausea but no vomiting. Denies having an aura or any visual disturbances. The patient has been on several medications. She has tried Botox in the past with minimal benefit. She was unable to tolerate triptan medication due to muscle aches. The patient is on Flexeril but only uses this medication as needed. She states that she will use Flexeril only if the headache radiates to her neck. She uses gabapentin as needed. She states that she cannot tolerate gabapentin daily  due to blurred vision. She uses Toradol and Phenergan for only severe headaches. She states that she can only take this medication when she is able to stay at home due to drowsiness. The patient is currently on propranolol 20 mg twice a day. She is on Topamax 1-1/2 tablets at bedtime. She continues to take Effexor daily. She has not tried any tricyclic antidepressants. She returns today for an evaluation.   Interval history CD  :  08-19-13 , Vicki Wilson presents today for the first time to me in a face-to-face visit with a history of migraines and chronic insomnia that follows a cyclic  Her migraines had much improved after she started butterburr . Her insomnia however has not responded to multiple prescription agents in the past. She has suffered from insomnia for at least 21 years in a cyclic pattern and after melatonin and Belsomra the last 2 agents she tried failed in combination she is now returned to Ambien CR 12.5 mg at night but she also does take melatonin an hour prior to bedtime with this. She also takes when necessary Flexeril and takes Neurontin which should promote sleep as well. Her medication list has been reviewed prior to this visit.  Sleep habits the patient usually goes to bed between 11 and 11:30 PM, even sleep aids she may lie awake until 3 or 3:30 AM before she can first time initiate sleep. Once asleep she will stay asleep for about 4-1/2 hours. She rarely has nocturia and usually does not drink liquids prior to bedtime. She does not report nocturnal palpitations or nightmares, she does not report nocturnal hot flashes also she may suffer some in daytime.  She drinks rarely caffeine. She does not nap in daytime except on week ends, She works 2 jobs .  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 She has early morning wakefulness. She is not refreshed in AM ,she does rely on an alarm.    Interval history from 06-09-15 : Shortly after our January visit the patient needed an additional dose of Phenergan and Toradol to break her HA, this helped her to sleep, to relax and actually broke the headache cycle. I would like to discuss with her to prescribe these 2 medications in a oral form so that she may avoid having to go to the emergency room or urgent care at headache onset. Some weeks she will  have one or 2 headaches and other weeks she may have none at all which is an improvement. She goes to bed at 10:30 or 11 falls asleep but wakes up at 4 AM, after 4 she may remain dozing in bed but feels that this has no restorative or refreshing quality at all. She feels actually more tired if she doesn't rise. Her 2 job situaion is changed , insomnia responded to Seroquel and Melatonin but doesn't like the grogginess. She tried again Ambien and felt more energetic, less groggy. She endorsed today the Epworth sleepiness score at 13 points which makes him more sleepy than in last visit the fatigue severity score at 43 points.  HISTORY Per old records with Dr Rexene Alberts and Dr Erling Cruz: In summary, AINSLEE SMETANA is a very pleasant 55 y.o.-year old female previously followed by Dr. Erling Cruz with a history of migraine without aura. She has been tried on multiple different abortive medications and has tried preventative medications in the form of imipramine and  Topamax as well as Effexor or in the past and is currently on low-dose Effexor and low-dose propranolol. I would like to suggest a cautious increase in her propranolol to 10 mg 3 times a day. On 40 mg daily she had diarrhea in the past. We will have to see how it goes. I had a long chat with the patient regarding her symptoms. Her exam is nonfocal and I reassured her in that regard. She is advised to continue using Flexeril and over-the-counter anti-inflammatory as needed for abortive treatment. She had side effects with several triptans in the past. She is encouraged to followup with her primary care physician for her chronic insomnia. I did give her a prescription for propranolol 10 mg 3 times a day with refills. I suggested a six-month followup with one of my colleagues for her chronic migraines. I answered all her questions today and she is encouraged to call with any interim problems. She was in agreement. This is her history : Jim Like. D.O.  Continues  to have difficulty with sleep. Has difficulty falling asleep and staying asleep, typically is getting around 4 hours of sleep a night. Currently is taking the Ambien CR and Melatonin nightly. Has tried Belsomra in the past but did not notice any benefit. Has had a sleep study completed since last visit which was unremarkable.   Continues to have difficulty with headaches. Typically will have a headache around 50% of the days of the month.  Currently taking Inderal 20mg  BID, riboflavin 400 and Magnesium 400mg . Similar headaches to prior visit.  Otherwise no acute symptoms. Remains stressed due to work and lack of sleep.  Has tried Ambien, Ambien CR, Lunesta, Sonata, Pamelor, Trazodone, Elavil. Currently taking Ambien CR and Melatonin 10mg  nightly. Has not gotten much benefit from this regimen.     REVIEW OF SYSTEMS: Out of a complete 14 system review of symptoms, the patient complains only of the following symptoms, and all other reviewed systems are negative.  Insomnia, frequently can, daytime sleepiness, headache  ALLERGIES: Allergies  Allergen Reactions  . Erythromycin     GI upset  . Triptans     myalgias    HOME MEDICATIONS: Outpatient Medications Prior to Visit  Medication Sig Dispense Refill  . acetaminophen (TYLENOL) 500 MG tablet Take 1,000 mg by mouth every 8 (eight) hours as needed.    Marland Kitchen CARAFATE 1 GM/10ML suspension Take 1 g by mouth as needed.     . cyclobenzaprine (FLEXERIL) 5 MG tablet Take 1 tablet (5 mg total) by mouth 3 (three) times daily as needed. 60 tablet 3  . fluticasone (FLONASE) 50 MCG/ACT nasal spray Place 2 sprays into the nose daily as needed.     . gabapentin (NEURONTIN) 100 MG capsule TAKE 1 CAPSULE (100 MG TOTAL) BY MOUTH 2 (TWO) TIMES DAILY. 60 capsule 1  . ketorolac (TORADOL) 10 MG tablet Take 1 tablet (10 mg total) by mouth every 6 (six) hours as needed. 10 tablet 0  . LINZESS 145 MCG CAPS capsule Take 145 mcg by mouth daily.   4  . Melatonin 10  MG TABS Take 10 tablets by mouth daily. Taking  1 tab QHS    . Multiple Vitamins-Iron (MULTIVITAMINS WITH IRON) TABS Take 1 tablet by mouth daily.    Marland Kitchen omeprazole (PRILOSEC) 40 MG capsule Take 40 mg by mouth daily.   11  . Petasin (PETADOLEX PO) Take 50 mg by mouth 2 (two) times daily. Reported on 01/02/2016    .  promethazine (PHENERGAN) 12.5 MG tablet Take 1 tablet (12.5 mg total) by mouth every 6 (six) hours as needed for nausea or vomiting. 30 tablet 0  . propranolol (INDERAL) 20 MG tablet TAKE 2 TABLETS BY MOUTH AT BEDTIME FOR MIGRAINE PROPHYSLAXIS 180 tablet 2  . topiramate (TOPAMAX) 100 MG tablet Take 1 tablet (100 mg total) by mouth at bedtime. 30 tablet 11  . triamcinolone cream (KENALOG) 0.1 % Apply 1 application topically 2 (two) times daily as needed.     . venlafaxine XR (EFFEXOR-XR) 37.5 MG 24 hr capsule TAKE 1 CAPSULE BY MOUTH DAILY. 90 capsule 1  . zolpidem (AMBIEN CR) 12.5 MG CR tablet TAKE 1 TAB BY MOUTH AT BEDTIME 30 tablet 5   No facility-administered medications prior to visit.     PAST MEDICAL HISTORY: Past Medical History:  Diagnosis Date  . Abnormal Pap smear    ASCUS-08/1998  LGSIL/HPV- 01/1999  . Anxiety   . Depression   . GERD (gastroesophageal reflux disease)   . Headache(784.0)   . IBS (irritable bowel syndrome)   . Migraines   . PFO (patent foramen ovale)   . Stroke ([place])   . Uterine fibroids affecting pregnancy     PAST SURGICAL HISTORY: Past Surgical History:  Procedure Laterality Date  . Bilateral Inferior tubinate reduction  1998  . 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 TONSILLECTOMY      FAMILY HISTORY: Family History  Problem Relation Age of Onset  . High blood pressure Mother   . Hyperlipidemia Mother   . Gout Father   . High blood pressure Father   . Diabetes Mellitus I Sister   . Other Sister     seizure disorder  . Alzheimer's disease Maternal Grandmother   . Cancer Paternal Grandmother     colon  .  Parkinson's disease Maternal Grandfather   . Kidney failure Other     SOCIAL HISTORY: Social History   Social History  . Marital status: Single    Spouse name: N/A  . Number of children: 0  . Years of education: 2 Masters   Occupational History  . [employer]   Social History Main Topics  . Smoking status: Never Smoker  . Smokeless tobacco: Never Used  . Alcohol use Yes     Comment: 5 oz of white wine every 3-6 months   . Drug use: No  . Sexual activity: No   Other Topics Concern  . Not on file   Social History Narrative   Patient is single no children, right handed,    Education level is 2 Master's degree   Caffeine consumption is 0      PHYSICAL EXAM  Vitals:   07/23/16 0830  BP: 110/62  Pulse: 88  Resp: 16  Weight: 144 lb (65.3 kg)  Height: 5\' 4"  (1.626 m)   Body mass index is 24.72 kg/m.  Generalized: Well developed, in no acute distress   Neurological examination  Mentation: Alert,  oriented to time, place,  Contributed to history taking. Follows all commands speech and language fluent. Cranial nerve  Pupils were equal round reactive to light. Extraocular movements were full, visual field were full on confrontational test.  Facial sensation and strength were normal. Uvula and  Tongue move  midline. Head turning and shoulder shrug  Were intact Sensory:  intact to soft touch on all 4 extremities. Coordination:  finger-nose-finger inatct bilaterally. Gait and station: Gait is  normal. Tandem gait is normal. Romberg is negative. No drift is seen. Reflexes: Deep tendon reflexes are symmetric and normal bilaterally.   DIAGNOSTIC DATA (LABS, IMAGING, TESTING) - I reviewed patient records, labs, notes, testing and imaging myself where available.  Vicki Wilson described that she had an MRI of the brain and an MRI of the cervical spine performed here with [facility], but I cannot find a report or the images on Epic. This  test was likely done before Dr. Erling Cruz left in 2014.    ASSESSMENT AND PLAN 55 y.o. year old female  has a past medical history of Abnormal Pap smear; Anxiety; Depression; GERD (gastroesophageal reflux disease); Headache(784.0); IBS (irritable bowel syndrome); Migraines; PFO (patent foramen ovale); Stroke [facility]); and Uterine fibroids affecting pregnancy. here with:  The patient is still working her notice for her second job, and is likely that a reduction in work hours and a reduction in stress will help with the headaches more than anything. This may also improve her insomnia. Today we do not need to offer an infusion therapy as the patient does not have a headache today.  1. Migraine headaches  We will increase Topamax  To 150 mg at bedtime 1.5 tab of 100 mg , 90 days supply). Failed propanolol- d/c . Failed Depakote PO.  She will continue using gabapentin as abortive therapy however if she finds that her headaches do not respond to gabapentin any longer we may consider retrying triptans. Imitrex, Frova and Maxalt - reportedly caused Myalgias, not effective.  She had tried Cambia, can not recall the response. Has tried Indomethacin.   Patient advised that if she has a headache that last greater than 48 hours and does not respond to abortive therapy we can consider doing an IV infusion. She voiced understanding.  She will follow-up in 6 months with NP.      Larey Seat, MD  07/23/2016, 8:39 AM [facility]: [facility] [address], [place], [place] [address] [phone]

## 2016-09-04 ENCOUNTER — Ambulatory Visit (INDEPENDENT_AMBULATORY_CARE_PROVIDER_SITE_OTHER): Payer: BLUE CROSS/BLUE SHIELD | Admitting: Nurse Practitioner

## 2016-09-04 ENCOUNTER — Encounter: Payer: Self-pay | Admitting: Nurse Practitioner

## 2016-09-04 VITALS — BP 102/62 | HR 76 | Resp 16 | Ht 63.0 in | Wt 144.0 lb

## 2016-09-04 DIAGNOSIS — Z01419 Encounter for gynecological examination (general) (routine) without abnormal findings: Secondary | ICD-10-CM

## 2016-09-04 DIAGNOSIS — Z0000 Encounter for general adult medical examination without abnormal findings: Secondary | ICD-10-CM

## 2016-09-04 LAB — POCT URINALYSIS DIPSTICK
BILIRUBIN UA: NEGATIVE
Glucose, UA: NEGATIVE
KETONES UA: NEGATIVE
LEUKOCYTES UA: NEGATIVE
NITRITE UA: NEGATIVE
PH UA: 6
PROTEIN UA: NEGATIVE
RBC UA: NEGATIVE
Urobilinogen, UA: NEGATIVE

## 2016-09-04 NOTE — Progress Notes (Signed)
Patient ID: Vicki Wilson, female   DOB: 06/26/1961, 56 y.o.   MRN: [id number]  55 y.o. [id number] Single  African American Fe here for annual exam. Recent breast pain with diagnotic Mammo done 07/19/16 was negative.  Some vaso symptoms that are tolerable.  Not dating or SA.  Patient's last menstrual period was 08/28/2013 (exact date).          Sexually active: No.  The current method of family planning is post menopausal status.    Exercising: No.  The patient does not participate in regular exercise at present. Smoker:  no  Health Maintenance: Pap: 07/12/15, Negative with neg HR HPV MMG: 07/19/16, Bi-Rads 1: Negative Colonoscopy: 06/01/13, 1-2 polyps, repeat in 5 years BMD: 2005, heel test TDaP: 03/13/10 Hep C and HIV: 02/02/15 Labs: Labs done with PCP and at work; Urine today: Negative   reports that she has never smoked. She has never used smokeless tobacco. She reports that she drinks alcohol. She reports that she does not use drugs.  Past Medical History:  Diagnosis Date  . Abnormal Pap smear    ASCUS-08/1998  LGSIL/HPV- 01/1999  . Anxiety   . Depression   . GERD (gastroesophageal reflux disease)   . Headache(784.0)   . IBS (irritable bowel syndrome)   . Migraines   . PFO (patent foramen ovale)   . Stroke ([place])   . Uterine fibroids affecting pregnancy     Past Surgical History:  Procedure Laterality Date  . Bilateral Inferior tubinate reduction  1998  . 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 TONSILLECTOMY      Current Outpatient Prescriptions  Medication Sig Dispense Refill  . acetaminophen (TYLENOL) 500 MG tablet Take 1,000 mg by mouth every 8 (eight) hours as needed.    Marland Kitchen CARAFATE 1 GM/10ML suspension Take 1 g by mouth as needed.     . cyclobenzaprine (FLEXERIL) 5 MG tablet Take 1 tablet (5 mg total) by mouth 3 (three) times daily as needed. 60 tablet 3  . fluticasone (FLONASE) 50 MCG/ACT nasal spray Place 2 sprays into the nose daily as  needed.     . gabapentin (NEURONTIN) 100 MG capsule TAKE 1 CAPSULE (100 MG TOTAL) BY MOUTH 2 (TWO) TIMES DAILY. 60 capsule 1  . ketorolac (TORADOL) 10 MG tablet Take 1 tablet (10 mg total) by mouth every 6 (six) hours as needed. 30 tablet 0  . LINZESS 145 MCG CAPS capsule Take 145 mcg by mouth daily.   4  . Melatonin 10 MG TABS Take 10 tablets by mouth daily. Taking  1 tab QHS    . Multiple Vitamins-Iron (MULTIVITAMINS WITH IRON) TABS Take 1 tablet by mouth daily.    Marland Kitchen omeprazole (PRILOSEC) 40 MG capsule Take 40 mg by mouth daily.   11  . promethazine (PHENERGAN) 12.5 MG tablet Take 1 tablet (12.5 mg total) by mouth every 6 (six) hours as needed for nausea or vomiting. 90 tablet 0  . topiramate (TOPAMAX) 100 MG tablet Take 1.5 tablets (150 mg total) by mouth at bedtime. 135 tablet 2  . triamcinolone cream (KENALOG) 0.1 % Apply 1 application topically 2 (two) times daily as needed.     . venlafaxine XR (EFFEXOR-XR) 37.5 MG 24 hr capsule TAKE 1 CAPSULE BY MOUTH DAILY. 90 capsule 1  . zolpidem (AMBIEN CR) 12.5 MG CR tablet TAKE 1 TAB BY MOUTH AT BEDTIME 30 tablet 5   No current facility-administered medications for this visit.  Family History  Problem Relation Age of Onset  . High blood pressure Mother   . Hyperlipidemia Mother   . Gout Father   . High blood pressure Father   . Diabetes Mellitus I Sister   . Other Sister     seizure disorder  . Alzheimer's disease Maternal Grandmother   . Cancer Paternal Grandmother     colon  . Parkinson's disease Maternal Grandfather   . Kidney failure Other     ROS:  Pertinent items are noted in HPI.  Otherwise, a comprehensive ROS was negative.  Exam:   LMP 08/28/2013 (Exact Date)    Ht Readings from Last 3 Encounters:  07/23/16 5\' 4"  (1.626 m)  07/09/16 5\' 4"  (1.626 m)  01/02/16 5\' 4"  (1.626 m)    General appearance: alert, cooperative and appears stated age Head: Normocephalic, without obvious abnormality, atraumatic Neck: no  adenopathy, supple, symmetrical, trachea midline and thyroid normal to inspection and palpation Lungs: clear to auscultation bilaterally Breasts: normal appearance, no masses or tenderness Heart: regular rate and rhythm Abdomen: soft, non-tender; no masses,  no organomegaly Extremities: extremities normal, atraumatic, no cyanosis or edema Skin: Skin color, texture, turgor normal. No rashes or lesions Lymph nodes: Cervical, supraclavicular, and axillary nodes normal. No abnormal inguinal nodes palpated Neurologic: Grossly normal   Pelvic: External genitalia:  no lesions              Urethra:  normal appearing urethra with no masses, tenderness or lesions              Bartholin's and Skene's: normal                 Vagina: normal appearing vagina with normal color and discharge, no lesions              Cervix: anteverted              Pap taken: No. Bimanual Exam:  Uterus:  normal size, contour, position, consistency, mobility, non-tender              Adnexa: no mass, fullness, tenderness               Rectovaginal: Confirms               Anus:  normal sphincter tone, no lesions  Chaperone present: yes  A:  Well Woman with normal exam     Menopausal with no VB since 08/28/13 - negative evaluation with PUS History of fibroids no size change with PUS 11/12/13 History of stroke in past with right lacunar infarct 08/2005 Remote history of remote ASCUS with negative HPV in 2003 Chronic insomnia             Migraine without aura   P:   Reviewed health and wellness pertinent to exam  Pap smear as above  Mammogram is due 07/2017  Counseled on breast self exam, mammography screening, adequate intake of calcium and vitamin D, diet and exercise return annually or prn  An After Visit Summary was printed and given to the patient.

## 2016-09-04 NOTE — Patient Instructions (Addendum)

## 2016-09-09 NOTE — Progress Notes (Signed)
Encounter reviewed by Dr. Jalila Goodnough Amundson C. Silva.  

## 2016-10-04 ENCOUNTER — Telehealth: Payer: Self-pay

## 2016-10-04 NOTE — Telephone Encounter (Signed)
Completed signed and sent to medical records.

## 2016-10-04 NOTE — Telephone Encounter (Signed)
FMLA PPW received on patient. Filling out today.

## 2016-12-30 ENCOUNTER — Other Ambulatory Visit: Payer: Self-pay | Admitting: Neurology

## 2017-01-15 ENCOUNTER — Other Ambulatory Visit: Payer: Self-pay | Admitting: Podiatry

## 2017-01-15 DIAGNOSIS — S93312A Subluxation of tarsal joint of left foot, initial encounter: Secondary | ICD-10-CM

## 2017-01-18 ENCOUNTER — Ambulatory Visit
Admission: RE | Admit: 2017-01-18 | Discharge: 2017-01-18 | Disposition: A | Discharge status: Home / Self Care | Payer: BLUE CROSS/BLUE SHIELD | Source: Ambulatory Visit | Attending: Podiatry | Admitting: Podiatry

## 2017-01-18 DIAGNOSIS — S93312A Subluxation of tarsal joint of left foot, initial encounter: Secondary | ICD-10-CM

## 2017-01-24 ENCOUNTER — Ambulatory Visit (INDEPENDENT_AMBULATORY_CARE_PROVIDER_SITE_OTHER): Payer: BLUE CROSS/BLUE SHIELD | Admitting: Adult Health

## 2017-01-24 ENCOUNTER — Encounter: Payer: Self-pay | Admitting: Adult Health

## 2017-01-24 VITALS — BP 110/68 | HR 72 | Ht 63.0 in | Wt 140.0 lb

## 2017-01-24 DIAGNOSIS — G43009 Migraine without aura, not intractable, without status migrainosus: Secondary | ICD-10-CM

## 2017-01-24 MED ORDER — TOPIRAMATE 100 MG PO TABS: 150.0000 mg | ORAL_TABLET | Freq: Every day | ORAL | 11 refills | Status: DC

## 2017-01-24 MED ORDER — ZOLPIDEM TARTRATE ER 12.5 MG PO TBCR: EXTENDED_RELEASE_TABLET | ORAL | 5 refills | Status: DC

## 2017-01-24 MED ORDER — PROPRANOLOL HCL 10 MG PO TABS: 10.0000 mg | ORAL_TABLET | Freq: Every day | ORAL | 0 refills | Status: DC

## 2017-01-24 NOTE — Progress Notes (Signed)
PATIENT: Vicki Wilson DOB: Jul 15, 1961  REASON FOR VISIT: follow up- migraine headaches HISTORY FROM: patient  HISTORY OF PRESENT ILLNESS:  Vicki Wilson is a 56 year old female with a history of migraine headaches. She returns today for follow-up. She is currently taking Topamax 150 mg at bedtime. She reports that recently her primary care place her on Xanax that she's been taking at bedtime. She reports that since she started taking Xanax approximately 3 hours before bedtime as well as taking Ambien at bedtime her sleep has improved. She states for the first time in a long time she wakes up and feels refreshed and not fatigued. She states because of this her headache frequency has also decreased. Her headaches always occur on the right side of the head. She denies having photophobia and phonophobia. Patient states that she is under a lot of stress and feels that this may be contributing to her anxiety and headaches. She returns today for an evaluation.   HISTORY  07-23-2016: Vicki Wilson is a 56 year old female with a history of migraine headaches.  She states that her headaches have gotten slightly worse on Thanksgiving, now getting better. Phenergan and toradol helped.  She states that she has essentially had a daily headache for the week of Thanksgiving. Her last headache was on 07-22-2016. She states that her headaches always occurs on the right side of the head and most recently behind the right eye. Occasionally she'll have some pressure in the neck on the right side. She states she can normally take gabapentin with Tylenol as abortive therapy and it will work well for her.  Lately  ( since this summer ) her headaches have not been responding to this. She states that she did take Toradol and it was helpful. She reports occasionally she will have photophobia and phonophobia. Denies nausea or vomiting.  In the past she has tried Botox but could not afford this ( Dr Jaynee Eagles ) and was not sure that it  helped. She continues on Topamax 100 mg at bedtime. She asked if this dose can be further increased. She is willling to under go a depakote infusion. Todays headache is not present. 7 days a month. FMLA request.  She has been  Referred to a psychologist for Insomnia. She has not followed up. Will not refill Ambien. She failed many drugs, see Dr Hazle Quant note  REVIEW OF SYSTEMS: Out of a complete 14 system review of symptoms, the patient complains only of the following symptoms, and all other reviewed systems are negative.  See history of present illness  ALLERGIES: Allergies  Allergen Reactions  . Erythromycin     GI upset  . Triptans     myalgias    HOME MEDICATIONS: Outpatient Medications Prior to Visit  Medication Sig Dispense Refill  . acetaminophen (TYLENOL) 500 MG tablet Take 1,000 mg by mouth every 8 (eight) hours as needed.    Marland Kitchen CARAFATE 1 GM/10ML suspension Take 1 g by mouth as needed.     . cyclobenzaprine (FLEXERIL) 5 MG tablet Take 1 tablet (5 mg total) by mouth 3 (three) times daily as needed. 60 tablet 3  . fluticasone (FLONASE) 50 MCG/ACT nasal spray Place 2 sprays into the nose daily as needed.     . gabapentin (NEURONTIN) 100 MG capsule TAKE 1 CAPSULE (100 MG TOTAL) BY MOUTH 2 (TWO) TIMES DAILY. (Patient taking differently: Take 100 mg by mouth 2 (two) times daily as needed. ) 60 capsule 1  . ketorolac (TORADOL)  10 MG tablet Take 1 tablet (10 mg total) by mouth every 6 (six) hours as needed. 30 tablet 0  . LINZESS 145 MCG CAPS capsule Take 145 mcg by mouth daily as needed.   4  . Melatonin 10 MG TABS Take 10 tablets by mouth daily. Taking  1 tab QHS    . Multiple Vitamins-Iron (MULTIVITAMINS WITH IRON) TABS Take 1 tablet by mouth daily.    Marland Kitchen omeprazole (PRILOSEC) 40 MG capsule Take 40 mg by mouth daily.   11  . promethazine (PHENERGAN) 12.5 MG tablet Take 1 tablet (12.5 mg total) by mouth every 6 (six) hours as needed for nausea or vomiting. 90 tablet 0  . propranolol  (INDERAL) 20 MG tablet Take 1 tablet by mouth at bedtime.  2  . topiramate (TOPAMAX) 100 MG tablet Take 1.5 tablets (150 mg total) by mouth at bedtime. 135 tablet 2  . triamcinolone cream (KENALOG) 0.1 % Apply 1 application topically 2 (two) times daily as needed.     . venlafaxine XR (EFFEXOR-XR) 37.5 MG 24 hr capsule TAKE 1 CAPSULE BY MOUTH DAILY. 90 capsule 1  . zolpidem (AMBIEN CR) 12.5 MG CR tablet TAKE 1 TAB BY MOUTH AT BEDTIME 30 tablet 5   No facility-administered medications prior to visit.     PAST MEDICAL HISTORY: Past Medical History:  Diagnosis Date  . Abnormal Pap smear    ASCUS-08/1998  LGSIL/HPV- 01/1999  . Anxiety   . Depression   . GERD (gastroesophageal reflux disease)   . Headache(784.0)   . IBS (irritable bowel syndrome)   . Migraines   . PFO (patent foramen ovale)   . Stroke ([place])   . Uterine fibroids affecting pregnancy     PAST SURGICAL HISTORY: Past Surgical History:  Procedure Laterality Date  . Bilateral Inferior tubinate reduction  1998  . 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 TONSILLECTOMY      FAMILY HISTORY: Family History  Problem Relation Age of Onset  . High blood pressure Mother   . Hyperlipidemia Mother   . Gout Father   . High blood pressure Father   . Diabetes Mellitus I Sister   . Other Sister        seizure disorder  . Alzheimer's disease Maternal Grandmother   . Cancer Paternal Grandmother        colon  . Parkinson's disease Maternal Grandfather   . Kidney failure Other     SOCIAL HISTORY: Social History   Social History  . Marital status: Single    Spouse name: N/A  . Number of children: 0  . Years of education: 2 Masters   Occupational History  . [employer]   Social History Main Topics  . Smoking status: Never Smoker  . Smokeless tobacco: Never Used  . Alcohol use Yes     Comment: 5 oz of white wine every 3-6 months   . Drug use: No  .  Sexual activity: No   Other Topics Concern  . Not on file   Social History Narrative   Patient is single no children, right handed,    Education level is 2 Master's degree   Caffeine consumption is 0      PHYSICAL EXAM  Vitals:   01/24/17 0843  BP: 110/68  Pulse: 72  Weight: 140 lb (63.5 kg)  Height: 5\' 3"  (1.6 m)   Body mass index is 24.8 kg/m.  Generalized: Well developed,  in no acute distress   Neurological examination  Mentation: Alert oriented to time, place, history taking. Follows all commands speech and language fluent Cranial nerve II-XII: Pupils were equal round reactive to light. Extraocular movements were full, visual field were full on confrontational test. Facial sensation and strength were normal. Uvula tongue midline. Head turning and shoulder shrug  were normal and symmetric. Motor: The motor testing reveals 5 over 5 strength of all 4 extremities. Good symmetric motor tone is noted throughout.  Sensory: Sensory testing is intact to soft touch on all 4 extremities. No evidence of extinction is noted.  Coordination: Cerebellar testing reveals good finger-nose-finger and heel-to-shin bilaterally.  Gait and station: Gait is normal. Tandem gait is normal. Romberg is negative. No drift is seen.  Reflexes: Deep tendon reflexes are symmetric and normal bilaterally.   DIAGNOSTIC DATA (LABS, IMAGING, TESTING) - I reviewed patient records, labs, notes, testing and imaging myself where available.  Lab Results  Component Value Date   WBC 4.5 02/18/2014   HGB 12.4 02/18/2014   HCT 36.4 02/18/2014   MCV 91.5 02/18/2014   PLT 221 02/18/2014      Component Value Date/Time   NA 142 02/18/2014 2130   K 3.9 02/18/2014 2130   CL 102 02/18/2014 2130   CO2 26 02/18/2014 2130   GLUCOSE 95 02/18/2014 2130   BUN 25 (H) 02/18/2014 2130   CREATININE 0.80 02/18/2014 2130   CALCIUM 9.8 02/18/2014 2130   PROT 7.1 02/18/2014 2130   ALBUMIN 4.2 02/18/2014 2130   AST 22  02/18/2014 2130   ALT 24 02/18/2014 2130   ALKPHOS 68 02/18/2014 2130   BILITOT <0.2 (L) 02/18/2014 2130   GFRNONAA 85 (L) 02/18/2014 2130   GFRAA >90 02/18/2014 2130    Lab Results  Component Value Date   TSH 1.337 10/22/2013      ASSESSMENT AND PLAN 56 y.o. year old female  has a past medical history of Abnormal Pap smear; Anxiety; Depression; GERD (gastroesophageal reflux disease); Headache(784.0); IBS (irritable bowel syndrome); Migraines; PFO (patent foramen ovale); Stroke [facility]); and Uterine fibroids affecting pregnancy. here with:  1. Migraine headache  The patient states that she has gained some benefit after starting Xanax for her anxiety. She does not wish to increase Topamax at this time. She will continue taking Topamax 150 mg at bedtime. I will refill it today. I did advise that she should try just Xanax and see if this still helpful for her sleep. If so she may also discontinue Ambien. The patient remains on propranolol 20 mg at bedtime. She is not sure if this offers her any benefit. We will decrease to 10 mg for the next week that she can begin taking 10 mg every other day for a week and then discontinue the medication. Patient is amenable to this plan. She will call if her symptoms worsen. She will follow-up in 6 months or sooner if needed.  I spent 25 minutes with the patient. 50% of this time was spent discussing her headache treatment as well as her stressors.    [place] Givens, MSN, NP-C 01/24/2017, 9:03 AM [facility] [address], [place], [place] [address] [phone]

## 2017-01-24 NOTE — Patient Instructions (Signed)
Continue Topamax 150 mg at bedtime Decrease Propranolol to 10 mg at bedtime for 1 week, then every other night for a week then stop medication If your symptoms worsen or you develop new symptoms please let us know.

## 2017-01-25 NOTE — Progress Notes (Signed)
I have reviewed and agreed above plan. 

## 2017-03-16 ENCOUNTER — Other Ambulatory Visit: Payer: Self-pay | Admitting: Adult Health

## 2017-03-19 ENCOUNTER — Telehealth: Payer: Self-pay | Admitting: Obstetrics & Gynecology

## 2017-03-19 NOTE — Telephone Encounter (Signed)
Left message regarding upcoming appointment has been canceled and needs to be rescheduled. °

## 2017-06-17 ENCOUNTER — Other Ambulatory Visit: Payer: Self-pay | Admitting: Neurology

## 2017-06-17 DIAGNOSIS — G44229 Chronic tension-type headache, not intractable: Secondary | ICD-10-CM

## 2017-07-01 ENCOUNTER — Other Ambulatory Visit: Payer: Self-pay | Admitting: Neurology

## 2017-07-16 ENCOUNTER — Other Ambulatory Visit: Payer: Self-pay | Admitting: Adult Health

## 2017-07-17 NOTE — Telephone Encounter (Signed)
[facility]ax confirmation received [facility] [place] [phone]. ambien.

## 2017-07-17 NOTE — Telephone Encounter (Signed)
Spoke to pt and she is off the xanax (was for a situational issue) and has not been taking since end 01/2017 early July 2018.   She has been on the [place] routinely.  She has upcoming appt 07-30-17 with MM/NP.   I gave 30 day supply no refill.

## 2017-07-19 ENCOUNTER — Other Ambulatory Visit: Payer: Self-pay | Admitting: Obstetrics & Gynecology

## 2017-07-19 DIAGNOSIS — Z1231 Encounter for screening mammogram for malignant neoplasm of breast: Secondary | ICD-10-CM

## 2017-07-30 ENCOUNTER — Other Ambulatory Visit: Payer: Self-pay | Admitting: Neurology

## 2017-07-30 ENCOUNTER — Encounter: Payer: Self-pay | Admitting: Adult Health

## 2017-07-30 ENCOUNTER — Ambulatory Visit (INDEPENDENT_AMBULATORY_CARE_PROVIDER_SITE_OTHER): Payer: Commercial Managed Care - PPO | Admitting: Adult Health

## 2017-07-30 VITALS — BP 114/76 | HR 62 | Wt 139.6 lb

## 2017-07-30 DIAGNOSIS — G43019 Migraine without aura, intractable, without status migrainosus: Secondary | ICD-10-CM

## 2017-07-30 DIAGNOSIS — G44229 Chronic tension-type headache, not intractable: Secondary | ICD-10-CM

## 2017-07-30 DIAGNOSIS — G4700 Insomnia, unspecified: Secondary | ICD-10-CM | POA: Diagnosis not present

## 2017-07-30 MED ORDER — ZOLPIDEM TARTRATE ER 12.5 MG PO TBCR: EXTENDED_RELEASE_TABLET | ORAL | 5 refills | Status: DC

## 2017-07-30 MED ORDER — TOPIRAMATE ER 50 MG PO CAP24: 150.0000 mg | ORAL_CAPSULE | Freq: Every day | ORAL | 11 refills | Status: DC

## 2017-07-30 NOTE — Patient Instructions (Signed)
Your Plan:  Change to Topiramate ER 150 mg daily  If headaches do not improve we can consider Ajovy If your symptoms worsen or you develop new symptoms please let us know.    Thank you for coming to see [place] at [facility]. I hope we have been able to provide you high quality care today.  You may receive a patient satisfaction survey over the next few weeks. We would appreciate your feedback and comments so that we may continue to improve ourselves and the health of our patients.

## 2017-07-30 NOTE — Progress Notes (Signed)
PATIENT: Vicki Wilson DOB: [birth year]  REASON FOR VISIT: follow up HISTORY FROM: patient  HISTORY OF PRESENT ILLNESS: Today 07/30/17 Ms. Vicki Wilson is a 56 year old female with a history of migraine headaches.  She returns today for follow-up.  She reports that she has noticed an increase in her migraine headaches in the last 6 weeks.  She reports that she has anywhere between 3-7 headaches a week.  She remains on Topamax 150 mg at bedtime.  She continues to take Ambien for sleep.  She also takes gabapentin when she develops a headache.  She reports that she has been under more stress.  She states that her workload at her job has increased.  She had a death in the family over the summer.  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  She reports that she has noticed some changes with her memory.  She reports that a lot of times she cannot think of the words she wants to say.  She is unsure if this is due to stress or potentially medication.  She remains on Effexor. She returns today for an evaluation.  REVIEW OF SYSTEMS: Out of a complete 14 system review of symptoms, the patient complains only of the following symptoms, and all other reviewed systems are negative.  See HPI  ALLERGIES: Allergies  Allergen Reactions  . Erythromycin     GI upset  . Triptans     myalgias    HOME MEDICATIONS: Outpatient Medications Prior to Visit  Medication Sig Dispense Refill  . acetaminophen (TYLENOL) 500 MG tablet Take 1,000 mg by mouth every 8 (eight) hours as needed.    . cyclobenzaprine (FLEXERIL) 5 MG tablet Take 1 tablet (5 mg total) by mouth 3 (three) times daily as needed. 60 tablet 3  . fluticasone (FLONASE) 50 MCG/ACT nasal spray Place 2 sprays into the nose daily as needed.     Marland Kitchen ketorolac (TORADOL) 10 MG tablet Take 1 tablet (10 mg total) by mouth every 6 (six) hours as needed. 30 tablet 0  . LINZESS 145 MCG CAPS capsule Take 145 mcg by mouth daily as needed.    4  . Melatonin 10 MG TABS Take 10 tablets by mouth daily. Taking  1 tab QHS    . Multiple Vitamins-Iron (MULTIVITAMINS WITH IRON) TABS Take 1 tablet by mouth daily.    Marland Kitchen omeprazole (PRILOSEC) 40 MG capsule Take 40 mg by mouth daily.   11  . promethazine (PHENERGAN) 12.5 MG tablet Take 1 tablet (12.5 mg total) by mouth every 6 (six) hours as needed for nausea or vomiting. 90 tablet 0  . topiramate (TOPAMAX) 100 MG tablet Take 1.5 tablets (150 mg total) by mouth at bedtime. 135 tablet 11  . triamcinolone cream (KENALOG) 0.1 % Apply 1 application topically 2 (two) times daily as needed.     . venlafaxine XR (EFFEXOR-XR) 37.5 MG 24 hr capsule TAKE 1 CAPSULE BY MOUTH DAILY. 90 capsule 1  . zolpidem (AMBIEN CR) 12.5 MG CR tablet TAKE 1 TABLET BY MOUTH AT BEDTIME 30 tablet 0  . gabapentin (NEURONTIN) 100 MG capsule TAKE 1 CAPSULE (100 MG TOTAL) BY MOUTH 2 (TWO) TIMES DAILY. 60 capsule 1  . ALPRAZolam (XANAX) 0.5 MG tablet Take 0.5 mg by mouth 2 (two) times daily as needed. Your taking 1/2 to one tablet po qhs  0  . CARAFATE 1 GM/10ML suspension Take 1 g by mouth as needed.     Marland Kitchen  propranolol (INDERAL) 10 MG tablet Take 1 tablet (10 mg total) by mouth at bedtime. 30 tablet 0   No facility-administered medications prior to visit.     PAST MEDICAL HISTORY: Past Medical History:  Diagnosis Date  . Abnormal Pap smear    ASCUS-08/1998  LGSIL/HPV- 01/1999  . Anxiety   . Depression   . GERD (gastroesophageal reflux disease)   . Headache(784.0)   . IBS (irritable bowel syndrome)   . Migraines   . PFO (patent foramen ovale)   . Stroke ([place])   . Uterine fibroids affecting pregnancy     PAST SURGICAL HISTORY: Past Surgical History:  Procedure Laterality Date  . Bilateral Inferior tubinate reduction  1998  . 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 TONSILLECTOMY      FAMILY HISTORY: Family History  Problem Relation Age of Onset  . High blood pressure Mother   .  Hyperlipidemia Mother   . Gout Father   . High blood pressure Father   . Diabetes Mellitus I Sister   . Other Sister        seizure disorder  . Alzheimer's disease Maternal Grandmother   . Cancer Paternal Grandmother        colon  . Parkinson's disease Maternal Grandfather   . Kidney failure Other     SOCIAL HISTORY: Social History   Socioeconomic History  . Marital status: Single    Spouse name: Not on file  . Number of children: 0  . Years of education: 2 Masters  . Highest education level: Not on file  Social Needs  . Financial resource strain: Not on file  . Food insecurity - worry: Not on file  . Food insecurity - inability: Not on file  . Transportation needs - medical: Not on file  . Transportation needs - non-medical: Not on file  Occupational History  . Occupation: PHYSICIAN ASSISTANT    Employer: [employer]  Tobacco Use  . Smoking status: Never Smoker  . Smokeless tobacco: Never Used  Substance and Sexual Activity  . Alcohol use: Yes    Comment: 5 oz of white wine every 3-6 months   . Drug use: No  . Sexual activity: No    Partners: Male    Birth control/protection: Abstinence  Other Topics Concern  . Not on file  Social History Narrative   Patient is single no children, right handed,    Education level is 2 Master's degree   Caffeine consumption is 0      PHYSICAL EXAM  Vitals:   07/30/17 0757  BP: 114/76  Pulse: 62  Weight: 139 lb 9.6 oz (63.3 kg)   Body mass index is 24.73 kg/m.  Generalized: Well developed, in no acute distress   Neurological examination  Mentation: Alert oriented to time, place, history taking. Follows all commands speech and language fluent Cranial nerve II-XII: Pupils were equal round reactive to light. Extraocular movements were full, visual field were full on confrontational test. Facial sensation and strength were normal. Uvula tongue midline. Head turning and shoulder shrug  were normal and  symmetric. Motor: The motor testing reveals 5 over 5 strength of all 4 extremities. Good symmetric motor tone is noted throughout.  Sensory: Sensory testing is intact to soft touch on all 4 extremities. No evidence of extinction is noted.  Coordination: Cerebellar testing reveals good finger-nose-finger and heel-to-shin bilaterally.  Gait and station: Gait is normal. Tandem gait is normal. Romberg is  negative. No drift is seen.  Reflexes: Deep tendon reflexes are symmetric and normal bilaterally.   DIAGNOSTIC DATA (LABS, IMAGING, TESTING) - I reviewed patient records, labs, notes, testing and imaging myself where available.  Lab Results  Component Value Date   WBC 4.5 02/18/2014   HGB 12.4 02/18/2014   HCT 36.4 02/18/2014   MCV 91.5 02/18/2014   PLT 221 02/18/2014      Component Value Date/Time   NA 142 02/18/2014 2130   K 3.9 02/18/2014 2130   CL 102 02/18/2014 2130   CO2 26 02/18/2014 2130   GLUCOSE 95 02/18/2014 2130   BUN 25 (H) 02/18/2014 2130   CREATININE 0.80 02/18/2014 2130   CALCIUM 9.8 02/18/2014 2130   PROT 7.1 02/18/2014 2130   ALBUMIN 4.2 02/18/2014 2130   AST 22 02/18/2014 2130   ALT 24 02/18/2014 2130   ALKPHOS 68 02/18/2014 2130   BILITOT <0.2 (L) 02/18/2014 2130   GFRNONAA 85 (L) 02/18/2014 2130   GFRAA >90 02/18/2014 2130    Lab Results  Component Value Date   TSH 1.337 10/22/2013      ASSESSMENT AND PLAN 56 y.o. year old female  has a past medical history of Abnormal Pap smear, Anxiety, Depression, GERD (gastroesophageal reflux disease), Headache(784.0), IBS (irritable bowel syndrome), Migraines, PFO (patent foramen ovale), Stroke ([place]), and Uterine fibroids affecting pregnancy. here with :  1.  Migraine headaches  The patient migraines have increased in frequency.  This could be due to to increased amount of stress.  I suggested that we try switching Topamax to the long-acting preparation to see if this helps with side effects.  If this offers her  no benefit we may need to consider another medication.  The patient is interested in trying Aimovig or Ajovy.  She was advised to call if her headache frequency or severity does not improve.     Ward Givens, MSN, NP-C 07/30/2017, 8:15 AM [facility] [address] [place], [place] [address] [phone]

## 2017-07-31 NOTE — Progress Notes (Signed)
I agree with the assessment and plan as directed by NP .The patient is known to me .   Rabecca Birge, MD  

## 2017-08-14 ENCOUNTER — Other Ambulatory Visit: Payer: Self-pay | Admitting: Neurology

## 2017-08-14 DIAGNOSIS — G44229 Chronic tension-type headache, not intractable: Secondary | ICD-10-CM

## 2017-08-14 MED ORDER — GABAPENTIN 100 MG PO CAPS: 100.0000 mg | ORAL_CAPSULE | Freq: Two times a day (BID) | ORAL | 1 refills | Status: DC

## 2017-08-20 ENCOUNTER — Ambulatory Visit
Admission: RE | Admit: 2017-08-20 | Discharge: 2017-08-20 | Disposition: A | Discharge status: Home / Self Care | Payer: BLUE CROSS/BLUE SHIELD | Source: Ambulatory Visit | Attending: Obstetrics & Gynecology | Admitting: Obstetrics & Gynecology

## 2017-08-20 DIAGNOSIS — Z1231 Encounter for screening mammogram for malignant neoplasm of breast: Secondary | ICD-10-CM

## 2017-08-23 ENCOUNTER — Telehealth: Payer: Self-pay | Admitting: Adult Health

## 2017-08-23 NOTE — Telephone Encounter (Signed)
Pt called she found out yesterday that insurance will not cover Topiramate ER (TROKENDI XR) 50 MG CP24 at all. She had a co-worker to tell her about the [employer] program, she signed up last night on line and she is wanting to know if Jinny Blossom will prescribe trokendi XR 50mg  3 [facility]. Please call to advise.

## 2017-08-26 NOTE — Telephone Encounter (Signed)
Spoke with patient and informed her that NP sent in Rx for Torokendi on 07/30/17. She would just need to take [employer] information [facility] and have pharmacist run Rx through. She verbalized understanding, appreciation.

## 2017-09-06 ENCOUNTER — Other Ambulatory Visit (HOSPITAL_COMMUNITY)
Admission: RE | Admit: 2017-09-06 | Discharge: 2017-09-06 | Disposition: A | Discharge status: Home / Self Care | Payer: Commercial Managed Care - PPO | Source: Ambulatory Visit | Attending: Certified Nurse Midwife | Admitting: Certified Nurse Midwife

## 2017-09-06 ENCOUNTER — Ambulatory Visit: Payer: BLUE CROSS/BLUE SHIELD | Admitting: Nurse Practitioner

## 2017-09-06 ENCOUNTER — Ambulatory Visit (INDEPENDENT_AMBULATORY_CARE_PROVIDER_SITE_OTHER): Payer: Commercial Managed Care - PPO | Admitting: Certified Nurse Midwife

## 2017-09-06 ENCOUNTER — Other Ambulatory Visit: Payer: Self-pay

## 2017-09-06 ENCOUNTER — Encounter: Payer: Self-pay | Admitting: Certified Nurse Midwife

## 2017-09-06 VITALS — BP 118/70 | HR 70 | Resp 16 | Ht 63.0 in | Wt 140.0 lb

## 2017-09-06 DIAGNOSIS — Z1151 Encounter for screening for human papillomavirus (HPV): Secondary | ICD-10-CM | POA: Diagnosis not present

## 2017-09-06 DIAGNOSIS — Z124 Encounter for screening for malignant neoplasm of cervix: Secondary | ICD-10-CM | POA: Insufficient documentation

## 2017-09-06 DIAGNOSIS — Z8669 Personal history of other diseases of the nervous system and sense organs: Secondary | ICD-10-CM

## 2017-09-06 DIAGNOSIS — N951 Menopausal and female climacteric states: Secondary | ICD-10-CM | POA: Diagnosis not present

## 2017-09-06 DIAGNOSIS — Z01419 Encounter for gynecological examination (general) (routine) without abnormal findings: Secondary | ICD-10-CM | POA: Diagnosis not present

## 2017-09-06 NOTE — Patient Instructions (Signed)

## 2017-09-06 NOTE — Progress Notes (Signed)
57 y.o. G0P0000 Single  African American Fe here for annual exam. Menopausal no HRT. Denies hot flashes or night sweats. Will occur with occasional glass of wine. Denies vaginal bleeding. Occasional vaginal dryness with no issues. Sees Dr. Jiles Prows neurologist for migraine and history of infarction. Sees PCP Dr. Kenton Kingfisher yearly all normal. Labs done at work, all normal per patient. 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 Not sexually active, no STD concerns. No other health issues.  Patient's last menstrual period was 08/28/2013 (exact date).          Sexually active: No.  The current method of family planning is abstinence.    Exercising: No.  exercise Smoker:  no  Health Maintenance: Pap:  07-12-15 neg HPV HR neg History of Abnormal Pap: yes MMG: 08-20-17 category b density birads 1:neg Self Breast exams: yes Colonoscopy:  2014 1-2 polyps, f/u 54yrs BMD:   2005, heel test TDaP:  2011 Shingles: no Pneumonia: no Hep C and HIV: both neg 2016 Labs: if needed Flu Vaccine; yes   reports that  has never smoked. she has never used smokeless tobacco. She reports that she drinks alcohol. She reports that she does not use drugs.  Past Medical History:  Diagnosis Date  . Abnormal Pap smear    ASCUS-08/1998  LGSIL/HPV- 01/1999  . Anxiety   . Depression   . GERD (gastroesophageal reflux disease)   . Headache(784.0)   . IBS (irritable bowel syndrome)   . Migraines   . PFO (patent foramen ovale)   . Stroke ([place])   . Uterine fibroids affecting pregnancy     Past Surgical History:  Procedure Laterality Date  . Bilateral Inferior tubinate reduction  1998  . 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 TONSILLECTOMY      Current Outpatient Medications  Medication Sig Dispense Refill  . acetaminophen (TYLENOL) 500 MG tablet Take 1,000 mg by mouth every 8 (eight) hours as needed.    Marland Kitchen BIOTIN PO Take by mouth.    . cyclobenzaprine (FLEXERIL) 5 MG  tablet Take 1 tablet (5 mg total) by mouth 3 (three) times daily as needed. 60 tablet 3  . fluticasone (FLONASE) 50 MCG/ACT nasal spray Place 2 sprays into the nose daily as needed.     . gabapentin (NEURONTIN) 100 MG capsule Take 1 capsule (100 mg total) by mouth 2 (two) times daily. (Patient taking differently: Take 100 mg by mouth as needed. ) 180 capsule 1  . ketorolac (TORADOL) 10 MG tablet Take 1 tablet (10 mg total) by mouth every 6 (six) hours as needed. 30 tablet 0  . linaclotide (LINZESS) 72 MCG capsule Take 72 mcg by mouth daily before breakfast.    . Melatonin 10 MG TABS Take 10 tablets by mouth daily. Taking  1 tab QHS    . Multiple Vitamins-Iron (MULTIVITAMINS WITH IRON) TABS Take 1 tablet by mouth daily.    Marland Kitchen omeprazole (PRILOSEC) 40 MG capsule Take 40 mg by mouth daily.   11  . promethazine (PHENERGAN) 12.5 MG tablet Take 1 tablet (12.5 mg total) by mouth every 6 (six) hours as needed for nausea or vomiting. 90 tablet 0  . topiramate (TOPAMAX) 100 MG tablet Take 150 mg by mouth daily.    Marland Kitchen triamcinolone cream (KENALOG) 0.1 % Apply 1 application topically 2 (two) times daily as needed.     . venlafaxine XR (EFFEXOR-XR) 37.5 MG 24 hr capsule TAKE 1 CAPSULE  BY MOUTH DAILY. 90 capsule 1  . zolpidem (AMBIEN CR) 12.5 MG CR tablet TAKE 1 TABLET BY MOUTH AT BEDTIME 30 tablet 5   No current facility-administered medications for this visit.     Family History  Problem Relation Age of Onset  . High blood pressure Mother   . Hyperlipidemia Mother   . Gout Father   . High blood pressure Father   . Diabetes Mellitus I Sister   . Other Sister        seizure disorder  . Alzheimer's disease Maternal Grandmother   . Cancer Paternal Grandmother        colon  . Parkinson's disease Maternal Grandfather   . Kidney failure Other   . Breast cancer Neg Hx     ROS:  Pertinent items are noted in HPI.  Otherwise, a comprehensive ROS was negative.  Exam:   BP 118/70   Pulse 70   Resp 16    Ht 5\' 3"  (1.6 m)   Wt 140 lb (63.5 kg)   LMP 08/28/2013 (Exact Date)   BMI 24.80 kg/m  Height: 5\' 3"  (160 cm) Ht Readings from Last 3 Encounters:  09/06/17 5\' 3"  (1.6 m)  01/24/17 5\' 3"  (1.6 m)  09/04/16 5\' 3"  (1.6 m)    General appearance: alert, cooperative and appears stated age. Head: Normocephalic, without obvious abnormality, atraumatic Neck: no adenopathy, supple, symmetrical, trachea midline and thyroid normal to inspection and palpation Lungs: clear to auscultation bilaterally Breasts: normal appearance, no masses or tenderness, No nipple retraction or dimpling, No nipple discharge or bleeding, No axillary or supraclavicular adenopathy Heart: regular rate and rhythm Abdomen: soft, non-tender; no masses,  no organomegaly Extremities: extremities normal, atraumatic, no cyanosis or edema Skin: Skin color, texture, turgor normal. No rashes or lesions Lymph nodes: Cervical, supraclavicular, and axillary nodes normal. No abnormal inguinal nodes palpated Neurologic: Grossly normal   Pelvic: External genitalia:  no lesions              Urethra:  normal appearing urethra with no masses, tenderness or lesions              Bartholin's and Skene's: normal                 Vagina: normal appearing vagina with normal color and discharge, no lesions              Cervix: no bleeding following Pap, no lesions and nulliparous appearance              Pap taken: Yes.   Bimanual Exam:  Uterus:  normal size, contour, position, consistency, mobility, non-tender              Adnexa: normal adnexa and no mass, fullness, tenderness               Rectovaginal: Confirms               Anus:  normal sphincter tone, no lesions  Chaperone present: yes  A:  Well Woman with normal exam  Menopausal no HRT  Migraine headaches/anxiety/depression/GERD  with MD management  Colonoscopy due    P:   Reviewed health and wellness pertinent to exam  Aware of need to evaluate if vaginal bleeding  Continue  follow up with MD as indicated.  Patient will schedule colonoscopy  Pap smear: yes   counseled on breast self exam, mammography screening, feminine hygiene, menopause, adequate intake of calcium and vitamin D, diet and exercise  return  annually or prn  An After Visit Summary was printed and given to the patient.

## 2017-09-10 LAB — CYTOLOGY - PAP
DIAGNOSIS: NEGATIVE
HPV (WINDOPATH): NOT DETECTED

## 2017-10-14 ENCOUNTER — Telehealth: Payer: Self-pay | Admitting: Adult Health

## 2017-10-14 NOTE — Telephone Encounter (Signed)
Since her medication is daily we could try Ajovy. Let me know if patient is interested. Not going to change Ambien right now. Lets get headaches under control first.

## 2017-10-14 NOTE — Telephone Encounter (Signed)
Spoke with patient and advised her that Jinny Blossom will prescribe Ajovy, once a month injections if she is interested. Patient stated she is and would like to come by office for sample and savings card Wed afternoon if they are available. This RN advised will check and call her back tomorrow. Advised her Jinny Blossom will not make any changes to ambien at this time but will get headaches under control.  Patient stated a VM may be left on her cell if she doesn't answer. She verbalized understanding, appreciation.

## 2017-10-14 NOTE — Telephone Encounter (Signed)
Pt called stating since 2/15 she's had daily migraines has taken all of her regular medications and even tried excedrin migraine but only gets slight relief. Pt stated on 2/20 she had gotten sick and threw up having to stay out of work for a few days. Also wanted to note she feels she never gets a good nights sleep, and has felt like she is dozing off behind the wheel on the way to work. Pt is currently taking zolpidem (AMBIEN CR) 12.5 MG CR tablet but just feels it isn't giving her that good nights rest.

## 2017-10-15 MED ORDER — FREMANEZUMAB-VFRM 225 MG/1.5ML ~~LOC~~ SOSY: 225.0000 mg | PREFILLED_SYRINGE | SUBCUTANEOUS | 5 refills | Status: DC

## 2017-10-15 NOTE — Telephone Encounter (Signed)
ajovy ordered

## 2017-10-15 NOTE — Addendum Note (Signed)
Addended by: Trudie Buckler on: 10/15/2017 03:34 PM   Modules accepted: Orders

## 2017-10-16 NOTE — Telephone Encounter (Signed)
Spoke with patient and advised her that this office does not have Ajovy samples, but this RN has a discount card for her.  Advised that the Rx was sent to her pharmacy yesterday, so she will take the card as it has [facility]. Advised her that typically insurance denies coverage, but she will get it through company x 1 year with the card. She is unable to come to office to pick up card, so this RN is mailing it to her today. She stated she is taking topamax 150 mg daily, Trokendi was too costly even with discount. She takes Excedrin Migraine or Tylenol with gabapentin as needed for headache control. This RN advised she not take Excedrin more than 4 x weekly. She verbalized understanding, appreciation.

## 2017-11-02 ENCOUNTER — Other Ambulatory Visit: Payer: Self-pay | Admitting: Adult Health

## 2017-11-07 NOTE — Telephone Encounter (Signed)
Vicki Wilson, Can you inquire if she still needs this?

## 2017-12-18 DIAGNOSIS — H40023 Open angle with borderline findings, high risk, bilateral: Secondary | ICD-10-CM | POA: Insufficient documentation

## 2017-12-24 ENCOUNTER — Other Ambulatory Visit: Payer: Self-pay | Admitting: Neurology

## 2018-01-28 ENCOUNTER — Ambulatory Visit (INDEPENDENT_AMBULATORY_CARE_PROVIDER_SITE_OTHER): Payer: Commercial Managed Care - PPO | Admitting: Adult Health

## 2018-01-28 ENCOUNTER — Encounter: Payer: Self-pay | Admitting: Adult Health

## 2018-01-28 VITALS — BP 114/74 | HR 68 | Ht 63.0 in | Wt 139.2 lb

## 2018-01-28 DIAGNOSIS — G43009 Migraine without aura, not intractable, without status migrainosus: Secondary | ICD-10-CM | POA: Diagnosis not present

## 2018-01-28 DIAGNOSIS — G4700 Insomnia, unspecified: Secondary | ICD-10-CM

## 2018-01-28 MED ORDER — TOPIRAMATE 100 MG PO TABS: 100.0000 mg | ORAL_TABLET | Freq: Every day | ORAL | 5 refills | Status: DC

## 2018-01-28 MED ORDER — TOPIRAMATE 25 MG PO TABS: 25.0000 mg | ORAL_TABLET | Freq: Every day | ORAL | 5 refills | Status: DC

## 2018-01-28 NOTE — Progress Notes (Signed)
PATIENT: Vicki Wilson DOB: 09-21-1960  REASON FOR VISIT: follow up HISTORY FROM: patient  HISTORY OF PRESENT ILLNESS: Today 01/28/18 : Vicki Wilson is a 57 year old female with a history of migraine headaches.  She returns today for follow-up.  She reports that since she started Ajovy her headache frequency and severity has decreased.  She reports that she has approximately 3-4 headaches a month.  She typically can take Tylenol and her headache resolves.  If Tylenol does not help she does take 100 mg of gabapentin and this resolves her headache.  She reports that she still has trouble sleeping.  She continues on the Ambien.  She reports that she is still under a lot of stress with her job.  She is currently in the process of looking for a new job.  She reports that on Topamax she still is noticing trouble with her memory.  She was prescribed  trokendi but this was not affordable.  She returns today for an evaluation.  HISTORY 07/30/17 Vicki Wilson is a 57 year old female with a history of migraine headaches.  She returns today for follow-up.  She reports that she has noticed an increase in her migraine headaches in the last 6 weeks.  She reports that she has anywhere between 3-7 headaches a week.  She remains on Topamax 150 mg at bedtime.  She continues to take Ambien for sleep.  She also takes gabapentin when she develops a headache.  She reports that she has been under more stress.  She states that her workload at her job has increased.  She had a death in the family over the summer.  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  She reports that she has noticed some changes with her memory.  She reports that a lot of times she cannot think of the words she wants to say.  She is unsure if this is due to stress or potentially medication.  She remains on Effexor. She returns today for an evaluation.   REVIEW OF SYSTEMS: Out of a complete 14 system review of symptoms, the  patient complains only of the following symptoms, and all other reviewed systems are negative.  Insomnia, headache, memory loss  ALLERGIES: Allergies  Allergen Reactions  . Erythromycin     GI upset  . Triptans     myalgias    HOME MEDICATIONS: Outpatient Medications Prior to Visit  Medication Sig Dispense Refill  . acetaminophen (TYLENOL) 500 MG tablet Take 1,000 mg by mouth every 8 (eight) hours as needed.    Marland Kitchen BIOTIN PO Take by mouth.    . cyclobenzaprine (FLEXERIL) 5 MG tablet Take 1 tablet (5 mg total) by mouth 3 (three) times daily as needed. 60 tablet 3  . fluticasone (FLONASE) 50 MCG/ACT nasal spray Place 2 sprays into the nose daily as needed.     . Fremanezumab-vfrm (AJOVY) 225 MG/1.5ML SOSY Inject 225 mg into the skin every 30 (thirty) days. 1.5 mL 5  . gabapentin (NEURONTIN) 100 MG capsule Take 1 capsule (100 mg total) by mouth 2 (two) times daily. (Patient taking differently: Take 100 mg by mouth as needed. ) 180 capsule 1  . ketorolac (TORADOL) 10 MG tablet TAKE 1 TABLET BY MOUTH EVERY 6 HOURS AS NEEDED. 10 tablet 0  . linaclotide (LINZESS) 72 MCG capsule Take 72 mcg by mouth daily before breakfast.    . Melatonin 10 MG TABS Take 10 tablets by mouth daily. Taking  1 tab QHS    . Multiple Vitamins-Iron (MULTIVITAMINS WITH IRON) TABS Take 1 tablet by mouth daily.    Marland Kitchen omeprazole (PRILOSEC) 40 MG capsule Take 40 mg by mouth daily.   11  . promethazine (PHENERGAN) 12.5 MG tablet Take 1 tablet (12.5 mg total) by mouth every 6 (six) hours as needed for nausea or vomiting. 90 tablet 0  . topiramate (TOPAMAX) 100 MG tablet Take 150 mg by mouth daily.    Marland Kitchen triamcinolone cream (KENALOG) 0.1 % Apply 1 application topically 2 (two) times daily as needed.     . venlafaxine XR (EFFEXOR-XR) 37.5 MG 24 hr capsule TAKE 1 CAPSULE BY MOUTH DAILY. 90 capsule 1  . zolpidem (AMBIEN CR) 12.5 MG CR tablet TAKE 1 TABLET BY MOUTH AT BEDTIME 30 tablet 5   No facility-administered medications  prior to visit.     PAST MEDICAL HISTORY: Past Medical History:  Diagnosis Date  . Abnormal Pap smear    ASCUS-08/1998  LGSIL/HPV- 01/1999  . Anxiety   . Depression   . GERD (gastroesophageal reflux disease)   . Headache(784.0)   . IBS (irritable bowel syndrome)   . Migraines   . PFO (patent foramen ovale)   . Stroke ([place])   . Uterine fibroids affecting pregnancy     PAST SURGICAL HISTORY: Past Surgical History:  Procedure Laterality Date  . Bilateral Inferior tubinate reduction  1998  . 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 TONSILLECTOMY      FAMILY HISTORY: Family History  Problem Relation Age of Onset  . High blood pressure Mother   . Hyperlipidemia Mother   . Gout Father   . High blood pressure Father   . Diabetes Mellitus I Sister   . Other Sister        seizure disorder  . Alzheimer's disease Maternal Grandmother   . Cancer Paternal Grandmother        colon  . Parkinson's disease Maternal Grandfather   . Kidney failure Other   . Breast cancer Neg Hx     SOCIAL HISTORY: Social History   Socioeconomic History  . Marital status: Single    Spouse name: Not on file  . Number of children: 0  . Years of education: 2 Masters  . Highest education level: Not on file  Occupational History  . Occupation: PHYSICIAN ASSISTANT    Employer: [employer]  Social Needs  . Financial resource strain: Not on file  . Food insecurity:    Worry: Not on file    Inability: Not on file  . Transportation needs:    Medical: Not on file    Non-medical: Not on file  Tobacco Use  . Smoking status: Never Smoker  . Smokeless tobacco: Never Used  Substance and Sexual Activity  . Alcohol use: Yes    Frequency: Never    Comment: rare  . Drug use: No  . Sexual activity: Never    Partners: Male    Birth control/protection: Abstinence  Lifestyle  . Physical activity:    Days per week: Not on file    Minutes per session: Not  on file  . Stress: Not on file  Relationships  . Social connections:    Talks on phone: Not on file    Gets together: Not on file    Attends religious service: Not on file    Active member of club or organization: Not on file    Attends  meetings of clubs or organizations: Not on file    Relationship status: Not on file  . Intimate partner violence:    Fear of current or ex partner: Not on file    Emotionally abused: Not on file    Physically abused: Not on file    Forced sexual activity: Not on file  Other Topics Concern  . Not on file  Social History Narrative   Patient is single no children, right handed,    Education level is 2 Master's degree   Caffeine consumption is 0      PHYSICAL EXAM  Vitals:   01/28/18 0740  Height: 5\' 3"  (1.6 m)   Body mass index is 24.8 kg/m.  Generalized: Well developed, in no acute distress   Neurological examination  Mentation: Alert oriented to time, place, history taking. Follows all commands speech and language fluent Cranial nerve II-XII: Pupils were equal round reactive to light. Extraocular movements were full, visual field were full on confrontational test. Facial sensation and strength were normal. Uvula tongue midline. Head turning and shoulder shrug  were normal and symmetric. Motor: The motor testing reveals 5 over 5 strength of all 4 extremities. Good symmetric motor tone is noted throughout.  Sensory: Sensory testing is intact to soft touch on all 4 extremities. No evidence of extinction is noted.  Coordination: Cerebellar testing reveals good finger-nose-finger and heel-to-shin bilaterally.  Gait and station: Gait is normal. Tandem gait is normal. Romberg is negative. No drift is seen.  Reflexes: Deep tendon reflexes are symmetric and normal bilaterally.   DIAGNOSTIC DATA (LABS, IMAGING, TESTING) - I reviewed patient records, labs, notes, testing and imaging myself where available.  Lab Results  Component Value Date   WBC  4.5 02/18/2014   HGB 12.4 02/18/2014   HCT 36.4 02/18/2014   MCV 91.5 02/18/2014   PLT 221 02/18/2014      Component Value Date/Time   NA 142 02/18/2014 2130   K 3.9 02/18/2014 2130   CL 102 02/18/2014 2130   CO2 26 02/18/2014 2130   GLUCOSE 95 02/18/2014 2130   BUN 25 (H) 02/18/2014 2130   CREATININE 0.80 02/18/2014 2130   CALCIUM 9.8 02/18/2014 2130   PROT 7.1 02/18/2014 2130   ALBUMIN 4.2 02/18/2014 2130   AST 22 02/18/2014 2130   ALT 24 02/18/2014 2130   ALKPHOS 68 02/18/2014 2130   BILITOT <0.2 (L) 02/18/2014 2130   GFRNONAA 85 (L) 02/18/2014 2130   GFRAA >90 02/18/2014 2130   No results found for: CHOL, HDL, LDLCALC, LDLDIRECT, TRIG, CHOLHDL No results found for: HGBA1C No results found for: VITAMINB12 Lab Results  Component Value Date   TSH 1.337 10/22/2013      ASSESSMENT AND PLAN 57 y.o. year old female  has a past medical history of Abnormal Pap smear, Anxiety, Depression, GERD (gastroesophageal reflux disease), Headache(784.0), IBS (irritable bowel syndrome), Migraines, PFO (patent foramen ovale), Stroke ([place]), and Uterine fibroids affecting pregnancy. here with:  1.  Migraine headaches 2.  Insomnia  The patient will continue on Ajovy.  We will slowly reduce Topamax to see if this resolves her cognitive side effects.  She will decrease to 125 mg at bedtime.  She will continue on Ambien for insomnia.  I have asked that if her symptoms worsen or she develops new symptoms she should let us know.  She will follow-up in 6 months or sooner if needed.    Ward Givens, MSN, NP-C 01/28/2018, 7:44 AM [facility] [id number]  [address] [place], [place] [address] [phone]

## 2018-01-28 NOTE — Patient Instructions (Signed)
Your Plan:  Continue Ayovy Decrease Topamax to 125 mg at bedtime If your symptoms worsen or you develop new symptoms please let us know.   Thank you for coming to see [place] at [facility]. I hope we have been able to provide you high quality care today.  You may receive a patient satisfaction survey over the next few weeks. We would appreciate your feedback and comments so that we may continue to improve ourselves and the health of our patients.

## 2018-01-31 NOTE — Progress Notes (Signed)
I agree with the assessment and plan as directed by NP .The patient is known to me .   Sareen Randon, MD  

## 2018-02-14 ENCOUNTER — Other Ambulatory Visit: Payer: Self-pay | Admitting: Adult Health

## 2018-02-17 NOTE — Telephone Encounter (Signed)
Pt is due for a refill on her ambien. Pt is up to date on her appts. [place] [facility] Registry checked.

## 2018-02-27 ENCOUNTER — Telehealth: Payer: Self-pay | Admitting: Adult Health

## 2018-02-27 NOTE — Telephone Encounter (Signed)
Returned call to patient and left detailed message (ok per DPR).  She is aware to request her HR department to fax over the FMLA ppw to [phone].  Our office will then reach out to her to collect a $50.00 ppw fee.  Next, her ppw will be reviewed and completed in two weeks or less.  I provided our office number to call back with any questions she may have about this process.

## 2018-02-27 NOTE — Telephone Encounter (Signed)
Pt is wanting to know if Megan,NP would be willing to fill out FMLA for migraines. Said she just had her review and her attendance for scheduled time off was brought up. This is the reason for FMLA. Please call to advise

## 2018-03-12 ENCOUNTER — Telehealth: Payer: Self-pay | Admitting: *Deleted

## 2018-03-12 DIAGNOSIS — Z0289 Encounter for other administrative examinations: Secondary | ICD-10-CM

## 2018-03-12 NOTE — Telephone Encounter (Signed)
Pt fmla paper work on [employer]

## 2018-03-12 NOTE — Telephone Encounter (Signed)
Received FMLA from [employer]. Form will be palced on NP's desk for review and signature when she returns to the office on 03/17/18.

## 2018-03-20 NOTE — Telephone Encounter (Signed)
FMLA papers completed, sent to [facility] for processing.

## 2018-03-21 ENCOUNTER — Telehealth: Payer: Self-pay | Admitting: *Deleted

## 2018-03-21 NOTE — Telephone Encounter (Signed)
Pt FMLA form faxed today to [facility].

## 2018-03-24 ENCOUNTER — Other Ambulatory Visit: Payer: Self-pay | Admitting: Adult Health

## 2018-03-24 NOTE — Telephone Encounter (Signed)
Rx submitted for 100 mg Topamax 1 tablet daily. On 01/28/2018 during office visit, patient's dosage was decreased from 150 mg daily to 125 mg daily. Patient is taking 1 100mg  tablet and 1 25 mg tablet. [facility] [place] [place] MB RN.

## 2018-04-19 ENCOUNTER — Other Ambulatory Visit: Payer: Self-pay | Admitting: Adult Health

## 2018-04-23 ENCOUNTER — Telehealth: Payer: Self-pay | Admitting: Adult Health

## 2018-04-23 NOTE — Telephone Encounter (Signed)
For the past 6 weeks patient has had 1-3 migraines per week. She has taken gabapentin (NEURONTIN) 100 MG capsule taken with ibuprofen, topiramate (TOPAMAX) 100 MG tablet which she has not been able to taper down the mg and AJOVY 225 MG/1.5ML SOSY which has not helped. Please call and discuss. [facility] [place] [facility] [address].

## 2018-04-23 NOTE — Telephone Encounter (Signed)
Spoke with [place] and gave appt. tomorrow with Jinny Blossom to discuss other tx. options for migraines/fim

## 2018-04-24 ENCOUNTER — Telehealth: Payer: Self-pay | Admitting: Adult Health

## 2018-04-24 ENCOUNTER — Ambulatory Visit: Payer: Self-pay | Admitting: Adult Health

## 2018-04-24 MED ORDER — ERENUMAB-AOOE 140 MG/ML ~~LOC~~ SOAJ: 140.0000 mg | SUBCUTANEOUS | 5 refills | Status: DC

## 2018-04-24 NOTE — Telephone Encounter (Signed)
Pt has returned the call to NP [facility], she says she will turn phone volume up to hear call back

## 2018-04-24 NOTE — Telephone Encounter (Addendum)
I called the patient.  She states that generally works well for her for the first 4 months.  He states in the last 2 months her headache frequency has began to increase.  She is having on average 1-3 headaches a week.  She continues to use gabapentin as an abortive treatment.  She is also on Effexor.  She has tried Botox in the past.  We will switch her from a Ajovy to [place] to see if that offers her any visit.  She has not taken her dose of Ajovy this month.  I advised that she should not take this medication and wait for Aimovig.

## 2018-04-24 NOTE — Addendum Note (Signed)
Addended by: Trudie Buckler on: 04/24/2018 02:02 PM   Modules accepted: Orders

## 2018-04-24 NOTE — Telephone Encounter (Signed)
I called the patient and LVM 

## 2018-05-01 ENCOUNTER — Telehealth: Payer: Self-pay | Admitting: *Deleted

## 2018-05-01 NOTE — Telephone Encounter (Signed)
inititated PA for aimovig CMM Key [id number].

## 2018-05-02 NOTE — Telephone Encounter (Signed)
Approval received for aimovig 140mg /ml thru 10-30-2018. PA [id number]  ID [id number] [phone] [facility].  [facility] [phone]  (confirmation received).

## 2018-05-11 ENCOUNTER — Other Ambulatory Visit: Payer: Self-pay | Admitting: Adult Health

## 2018-06-16 ENCOUNTER — Encounter: Payer: Self-pay | Admitting: Certified Nurse Midwife

## 2018-06-25 ENCOUNTER — Other Ambulatory Visit: Payer: Self-pay | Admitting: Neurology

## 2018-07-28 ENCOUNTER — Encounter: Payer: Self-pay | Admitting: Certified Nurse Midwife

## 2018-07-30 ENCOUNTER — Ambulatory Visit (INDEPENDENT_AMBULATORY_CARE_PROVIDER_SITE_OTHER): Payer: Commercial Managed Care - PPO | Admitting: Adult Health

## 2018-07-30 ENCOUNTER — Encounter: Payer: Self-pay | Admitting: Adult Health

## 2018-07-30 VITALS — BP 124/80 | HR 75 | Ht 63.0 in | Wt 140.6 lb

## 2018-07-30 DIAGNOSIS — F5104 Psychophysiologic insomnia: Secondary | ICD-10-CM | POA: Diagnosis not present

## 2018-07-30 DIAGNOSIS — G43009 Migraine without aura, not intractable, without status migrainosus: Secondary | ICD-10-CM | POA: Diagnosis not present

## 2018-07-30 MED ORDER — TOPIRAMATE 25 MG PO TABS: 100.0000 mg | ORAL_TABLET | Freq: Every day | ORAL | 5 refills | Status: DC

## 2018-07-30 MED ORDER — ZOLPIDEM TARTRATE ER 12.5 MG PO TBCR: EXTENDED_RELEASE_TABLET | ORAL | 5 refills | Status: DC

## 2018-07-30 NOTE — Patient Instructions (Signed)
Your Plan:  Continue Topamax, Gabapentin and Aimovig Referral to sleep psychology If your symptoms worsen or you develop new symptoms please let us know.   Thank you for coming to see [place] at [facility]. I hope we have been able to provide you high quality care today.  You may receive a patient satisfaction survey over the next few weeks. We would appreciate your feedback and comments so that we may continue to improve ourselves and the health of our patients.

## 2018-07-30 NOTE — Progress Notes (Signed)
PATIENT: Vicki Wilson DOB: [birth year]  REASON FOR VISIT: follow up HISTORY FROM: patient  HISTORY OF PRESENT ILLNESS: Today 07/30/18:  Vicki Wilson is a 57 year old female with a history of migraine headaches.  She returns today for follow-up.  She was switched to [place].  She reports that she has a headache approximately every other week.  She states that her headaches are not as severe as they used to be.  She states that she is only had 2 severe headaches since last visit that she actually had a call at work.  She continues to use gabapentin as an abortive therapy.  She is now on Topamax 100 mg at bedtime.  She has continued to try to reduce her dose but her headaches tend to return.  She also reports that she has been having issues with insomnia.  The patient has chronic insomnia for the last 20 years.  She has tried multiple medications.  She is currently on Ambien CR 10.5 mg.  She states that sometimes she has trouble falling asleep but most often she wakes up after 5 to 6 hours of sleep.  She has noticed that she has began to be drowsy during the day especially after working and driving home.  She has had a sleep study in the past that was unremarkable.  She returns today for evaluation.  HISTORY 01/28/18 : Vicki Wilson is a 57 year old female with a history of migraine headaches.  She returns today for follow-up.  She reports that since she started Ajovy her headache frequency and severity has decreased.  She reports that she has approximately 3-4 headaches a month.  She typically can take Tylenol and her headache resolves.  If Tylenol does not help she does take 100 mg of gabapentin and this resolves her headache.  She reports that she still has trouble sleeping.  She continues on the Ambien.  She reports that she is still under a lot of stress with her job.  She is currently in the process of looking for a new job.  She reports that on Topamax she still is noticing trouble with her memory.   She was prescribed  trokendi but this was not affordable.  She returns today for an evaluation.   REVIEW OF SYSTEMS: Out of a complete 14 system review of symptoms, the patient complains only of the following symptoms, and all other reviewed systems are negative.  Insomnia, daytime sleepiness, headache  ALLERGIES: Allergies  Allergen Reactions  . Erythromycin     GI upset  . Triptans     myalgias    HOME MEDICATIONS: Outpatient Medications Prior to Visit  Medication Sig Dispense Refill  . acetaminophen (TYLENOL) 500 MG tablet Take 1,000 mg by mouth every 8 (eight) hours as needed.    . cyclobenzaprine (FLEXERIL) 5 MG tablet Take 1 tablet (5 mg total) by mouth 3 (three) times daily as needed. 60 tablet 3  . Erenumab-aooe (AIMOVIG) 140 MG/ML SOAJ Inject 140 mg into the skin every 30 (thirty) days. 1 mL 5  . fluticasone (FLONASE) 50 MCG/ACT nasal spray Place 2 sprays into the nose daily as needed.     . gabapentin (NEURONTIN) 100 MG capsule Take 1 capsule (100 mg total) by mouth 2 (two) times daily. (Patient taking differently: Take 100 mg by mouth as needed. ) 180 capsule 1  . ketorolac (TORADOL) 10 MG tablet TAKE 1 TABLET BY MOUTH EVERY 6 HOURS AS NEEDED. 10 tablet 0  . Melatonin 10 MG  TABS Take 10 tablets by mouth daily. Taking  1 tab QHS    . Multiple Vitamins-Iron (MULTIVITAMINS WITH IRON) TABS Take 1 tablet by mouth daily.    Marland Kitchen omeprazole (PRILOSEC) 40 MG capsule Take 40 mg by mouth daily.   11  . Plecanatide (TRULANCE) 3 MG TABS Take 3 mg by mouth daily.    . promethazine (PHENERGAN) 12.5 MG tablet Take 1 tablet (12.5 mg total) by mouth every 6 (six) hours as needed for nausea or vomiting. 90 tablet 0  . topiramate (TOPAMAX) 100 MG tablet Take 100 mg by mouth at bedtime.    . triamcinolone cream (KENALOG) 0.1 % Apply 1 application topically 2 (two) times daily as needed.     . venlafaxine XR (EFFEXOR-XR) 37.5 MG 24 hr capsule TAKE 1 CAPSULE BY MOUTH DAILY. 90 capsule 1  .  zolpidem (AMBIEN CR) 12.5 MG CR tablet TAKE 1 TABLET AT BEDTIME 30 tablet 5  . topiramate (TOPAMAX) 100 MG tablet Take 1 tablet (100 mg total) by mouth at bedtime. 90 tablet 11  . BIOTIN PO Take by mouth.    . linaclotide (LINZESS) 72 MCG capsule Take 72 mcg by mouth daily as needed.     . topiramate (TOPAMAX) 100 MG tablet Take 1 tablet (100 mg total) by mouth daily. 30 tablet 5  . topiramate (TOPAMAX) 25 MG tablet Take 1 tablet (25 mg total) by mouth at bedtime. 30 tablet 5   No facility-administered medications prior to visit.     PAST MEDICAL HISTORY: Past Medical History:  Diagnosis Date  . Abnormal Pap smear    ASCUS-08/1998  LGSIL/HPV- 01/1999  . Anxiety   . Depression   . GERD (gastroesophageal reflux disease)   . Headache(784.0)   . IBS (irritable bowel syndrome)   . Migraines   . PFO (patent foramen ovale)   . Stroke ([place])   . Uterine fibroids affecting pregnancy     PAST SURGICAL HISTORY: Past Surgical History:  Procedure Laterality Date  . Bilateral Inferior tubinate reduction  1998  . 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 TONSILLECTOMY      FAMILY HISTORY: Family History  Problem Relation Age of Onset  . High blood pressure Mother   . Hyperlipidemia Mother   . Gout Father   . High blood pressure Father   . Diabetes Mellitus I Sister   . Other Sister        seizure disorder  . Alzheimer's disease Maternal Grandmother   . Cancer Paternal Grandmother        colon  . Parkinson's disease Maternal Grandfather   . Kidney failure Other   . Breast cancer Neg Hx     SOCIAL HISTORY: Social History   Socioeconomic History  . Marital status: Single    Spouse name: Not on file  . Number of children: 0  . Years of education: 2 Masters  . Highest education level: Not on file  Occupational History  . Occupation: PHYSICIAN ASSISTANT    Employer: [employer]  Social Needs  . Financial resource strain: Not on  file  . Food insecurity:    Worry: Not on file    Inability: Not on file  . Transportation needs:    Medical: Not on file    Non-medical: Not on file  Tobacco Use  . Smoking status: Never Smoker  . Smokeless tobacco: Never Used  Substance and Sexual Activity  . Alcohol use: Yes  Frequency: Never    Comment: rare  . Drug use: No  . Sexual activity: Never    Partners: Male    Birth control/protection: Abstinence  Lifestyle  . Physical activity:    Days per week: Not on file    Minutes per session: Not on file  . Stress: Not on file  Relationships  . Social connections:    Talks on phone: Not on file    Gets together: Not on file    Attends religious service: Not on file    Active member of club or organization: Not on file    Attends meetings of clubs or organizations: Not on file    Relationship status: Not on file  . Intimate partner violence:    Fear of current or ex partner: Not on file    Emotionally abused: Not on file    Physically abused: Not on file    Forced sexual activity: Not on file  Other Topics Concern  . Not on file  Social History Narrative   Patient is single no children, right handed,    Education level is 2 Master's degree   Caffeine consumption is 0      PHYSICAL EXAM  Vitals:   07/30/18 0711  BP: 124/80  Pulse: 75  Weight: 140 lb 9.6 oz (63.8 kg)  Height: 5\' 3"  (1.6 m)   Body mass index is 24.91 kg/m.  Generalized: Well developed, in no acute distress   Neurological examination  Mentation: Alert oriented to time, place, history taking. Follows all commands speech and language fluent Cranial nerve II-XII: Pupils were equal round reactive to light. Extraocular movements were full, visual field were full on confrontational test. Facial sensation and strength were normal. Uvula tongue midline. Head turning and shoulder shrug  were normal and symmetric. Motor: The motor testing reveals 5 over 5 strength of all 4 extremities. Good  symmetric motor tone is noted throughout.  Sensory: Sensory testing is intact to soft touch on all 4 extremities. No evidence of extinction is noted.  Coordination: Cerebellar testing reveals good finger-nose-finger and heel-to-shin bilaterally.  Gait and station: Gait is normal. Tandem gait is normal. Romberg is negative. No drift is seen.  Reflexes: Deep tendon reflexes are symmetric and normal bilaterally.   DIAGNOSTIC DATA (LABS, IMAGING, TESTING) - I reviewed patient records, labs, notes, testing and imaging myself where available.  Lab Results  Component Value Date   WBC 4.5 02/18/2014   HGB 12.4 02/18/2014   HCT 36.4 02/18/2014   MCV 91.5 02/18/2014   PLT 221 02/18/2014      Component Value Date/Time   NA 142 02/18/2014 2130   K 3.9 02/18/2014 2130   CL 102 02/18/2014 2130   CO2 26 02/18/2014 2130   GLUCOSE 95 02/18/2014 2130   BUN 25 (H) 02/18/2014 2130   CREATININE 0.80 02/18/2014 2130   CALCIUM 9.8 02/18/2014 2130   PROT 7.1 02/18/2014 2130   ALBUMIN 4.2 02/18/2014 2130   AST 22 02/18/2014 2130   ALT 24 02/18/2014 2130   ALKPHOS 68 02/18/2014 2130   BILITOT <0.2 (L) 02/18/2014 2130   GFRNONAA 85 (L) 02/18/2014 2130   GFRAA >90 02/18/2014 2130    Lab Results  Component Value Date   TSH 1.337 10/22/2013      ASSESSMENT AND PLAN 57 y.o. year old female  has a past medical history of Abnormal Pap smear, Anxiety, Depression, GERD (gastroesophageal reflux disease), Headache(784.0), IBS (irritable bowel syndrome), Migraines, PFO (patent foramen ovale),  Stroke [facility]), and Uterine fibroids affecting pregnancy. here with:  1.  Migraine headaches 2.  Chronic insomnia  The patient will continue on Effexor gabapentin, Aimovig and Topamax.  I will change her Topamax to 25 mg tablets and she can continue to try to decrease her dose to help with cognitive side effects.  The patient continues to have issues with insomnia.  She has tried multiple medications over the last 20  years.  Advised patient that we would refer her to a sleep psychologist.  I am not sure that changing or adding any new medication would offer her much benefit at this time.  She voiced understanding.  She will follow-up in 6 months or sooner if needed.   I spent 15 minutes with the patient. 50% of this time was spent reviewing her plan of care   Ward Givens, MSN, NP-C 07/30/2018, 7:29 AM [facility] [address] [place], [place] [address] [phone]

## 2018-08-14 ENCOUNTER — Other Ambulatory Visit: Payer: Self-pay | Admitting: Obstetrics & Gynecology

## 2018-08-14 DIAGNOSIS — Z1231 Encounter for screening mammogram for malignant neoplasm of breast: Secondary | ICD-10-CM

## 2018-09-11 ENCOUNTER — Ambulatory Visit
Admission: RE | Admit: 2018-09-11 | Discharge: 2018-09-11 | Disposition: A | Discharge status: Home / Self Care | Payer: Commercial Managed Care - PPO | Source: Ambulatory Visit | Attending: Obstetrics & Gynecology | Admitting: Obstetrics & Gynecology

## 2018-09-11 DIAGNOSIS — Z1231 Encounter for screening mammogram for malignant neoplasm of breast: Secondary | ICD-10-CM

## 2018-09-17 ENCOUNTER — Other Ambulatory Visit: Payer: Self-pay | Admitting: Adult Health

## 2018-09-17 MED ORDER — ZOLPIDEM TARTRATE 10 MG PO TABS: 10.0000 mg | ORAL_TABLET | Freq: Every evening | ORAL | 0 refills | Status: DC | PRN

## 2018-09-17 NOTE — Telephone Encounter (Signed)
Pt states that her pharmacy informed her that the zolpidem (AMBIEN CR) 12.5 MG CR tablet is not covered but the 10mg  is. She would like to know if [facility] can [facility] [address]. Her insurance states they will pay the 90 day supply. Please advise.

## 2018-09-17 NOTE — Telephone Encounter (Signed)
Drug registry checked  Last fill 08/11/2018 #30.  Ok to change?  I placed order (only for 30days, you may want to change?

## 2018-09-24 NOTE — Progress Notes (Signed)
58 y.o. G0P0000 Single  African American Fe here for annual exam. Menopausal no vaginal bleeding or vaginal dryness. Sees PCP [facility] for aex, labs. Sees Neurology for migraine headache management. Now on preventive treatment with Aimovig. Uses Topamax for treatment also. Effexor-XR for anxiety working well.  Sees FNP on site if needed for other health issues.. No health issues today. Taking trip to [place] with family in spring!  Patient's last menstrual period was 08/28/2013 (exact date).          Sexually active: No.  The current method of family planning is post menopausal status.    Exercising: No.  exercise Smoker:  no  Review of Systems  Constitutional: Negative.   HENT: Negative.   Eyes: Negative.   Respiratory: Negative.   Cardiovascular: Negative.   Gastrointestinal: Negative.   Genitourinary: Negative.   Musculoskeletal: Negative.   Skin: Negative.   Neurological: Negative.   Endo/Heme/Allergies: Negative.   Psychiatric/Behavioral: Negative.     Health Maintenance: Pap:  09-10-14 neg HPV HR neg, 09-06-17 neg HPV HR neg History of Abnormal Pap: yes 2000 MMG:  09-11-2018 category b density birads 1:neg Self Breast exams: yes Colonoscopy:  2019 f/u 37yrs BMD:   2005 heel test TDaP:  2019 Shingles: no Pneumonia: no Hep C and HIV: both neg 2016 Labs: no   reports that she has never smoked. She has never used smokeless tobacco. She reports current alcohol use. She reports that she does not use drugs.  Past Medical History:  Diagnosis Date  . Abnormal Pap smear    ASCUS-08/1998  LGSIL/HPV- 01/1999  . Anxiety   . Depression   . GERD (gastroesophageal reflux disease)   . Headache(784.0)   . IBS (irritable bowel syndrome)   . Migraines   . PFO (patent foramen ovale)   . Stroke ([place])   . Uterine fibroids affecting pregnancy     Past Surgical History:  Procedure Laterality Date  . Bilateral Inferior tubinate reduction  1998  . Epicondylitis Right 05/2009    with ulnar nerve decompression   . TONSILLECTOMY      Current Outpatient Medications  Medication Sig Dispense Refill  . acetaminophen (TYLENOL) 500 MG tablet Take 1,000 mg by mouth every 8 (eight) hours as needed.    Marland Kitchen BIOTIN PO Take by mouth.    . cyclobenzaprine (FLEXERIL) 5 MG tablet Take 1 tablet (5 mg total) by mouth 3 (three) times daily as needed. 60 tablet 3  . Erenumab-aooe (AIMOVIG) 140 MG/ML SOAJ Inject 140 mg into the skin every 30 (thirty) days. 1 mL 5  . fluticasone (FLONASE) 50 MCG/ACT nasal spray Place 2 sprays into the nose daily as needed.     . gabapentin (NEURONTIN) 100 MG capsule Take 1 capsule (100 mg total) by mouth 2 (two) times daily. (Patient taking differently: Take 100 mg by mouth as needed. ) 180 capsule 1  . ketorolac (TORADOL) 10 MG tablet TAKE 1 TABLET BY MOUTH EVERY 6 HOURS AS NEEDED. 10 tablet 0  . Melatonin 10 MG TABS Take 10 tablets by mouth daily. Taking  1 tab QHS    . Multiple Vitamins-Iron (MULTIVITAMINS WITH IRON) TABS Take 1 tablet by mouth daily.    Marland Kitchen omeprazole (PRILOSEC) 40 MG capsule Take 40 mg by mouth daily.   11  . Plecanatide (TRULANCE) 3 MG TABS Take 3 mg by mouth daily.    . promethazine (PHENERGAN) 12.5 MG tablet Take 1 tablet (12.5 mg total) by mouth every 6 (six)  hours as needed for nausea or vomiting. 90 tablet 0  . topiramate (TOPAMAX) 25 MG tablet Take 4 tablets (100 mg total) by mouth daily. 120 tablet 5  . triamcinolone cream (KENALOG) 0.1 % Apply 1 application topically 2 (two) times daily as needed.     . venlafaxine XR (EFFEXOR-XR) 37.5 MG 24 hr capsule TAKE 1 CAPSULE BY MOUTH DAILY. 90 capsule 1  . zolpidem (AMBIEN) 10 MG tablet Take 1 tablet (10 mg total) by mouth at bedtime as needed for sleep. 90 tablet 0   No current facility-administered medications for this visit.     Family History  Problem Relation Age of Onset  . High blood pressure Mother   . Hyperlipidemia Mother   . Gout Father   . High blood pressure Father    . Diabetes Mellitus I Sister   . Other Sister        seizure disorder  . Alzheimer's disease Maternal Grandmother   . Cancer Paternal Grandmother        colon  . Parkinson's disease Maternal Grandfather   . Kidney failure Other   . Breast cancer Neg Hx     ROS:  Pertinent items are noted in HPI.  Otherwise, a comprehensive ROS was negative.  Exam:   LMP 08/28/2013 (Exact Date)    Ht Readings from Last 3 Encounters:  07/30/18 5\' 3"  (1.6 m)  01/28/18 5\' 3"  (1.6 m)  09/06/17 5\' 3"  (1.6 m)    General appearance: alert, cooperative and appears stated age Head: Normocephalic, without obvious abnormality, atraumatic Neck: no adenopathy, supple, symmetrical, trachea midline and thyroid normal to inspection and palpation Lungs: clear to auscultation bilaterally Breasts: normal appearance, no masses or tenderness, No nipple retraction or dimpling, No nipple discharge or bleeding, No axillary or supraclavicular adenopathy Heart: regular rate and rhythm Abdomen: soft, non-tender; no masses,  no organomegaly Extremities: extremities normal, atraumatic, no cyanosis or edema Skin: Skin color, texture, turgor normal. No rashes or lesions Lymph nodes: Cervical, supraclavicular, and axillary nodes normal. No abnormal inguinal nodes palpated Neurologic: Grossly normal   Pelvic: External genitalia:  no lesions              Urethra:  normal appearing urethra with no masses, tenderness or lesions              Bartholin's and Skene's: normal                 Vagina: atrophic appearing vagina with normal color and scant moisture, no lesions              Cervix: no cervical motion tenderness, no lesions and nulliparous appearance              Pap taken: No. Bimanual Exam:  Uterus:  normal size, contour, position, consistency, mobility, non-tender and anteverted              Adnexa: normal adnexa and no mass, fullness, tenderness               Rectovaginal: Confirms               Anus:  normal  sphincter tone, no lesions  Chaperone present: yes  A:  Well Woman with normal exam  Menopausal no HRT  Atrophic vaginitis  Migraine management with neurology  P:   Reviewed health and wellness pertinent to exam  Aware of need to advise if vaginal bleeding  Discussed finding and etiology. Discussed OTC coconut oil or Olive  oil twice weekly. Instructions given. Will advise if issues.  Continue follow up with MD regarding as indicated  Pap smear: no   counseled on feminine hygiene, menopause, adequate intake of calcium and vitamin D, diet and exercise  return annually or prn  An After Visit Summary was printed and given to the patient.

## 2018-09-25 ENCOUNTER — Ambulatory Visit (INDEPENDENT_AMBULATORY_CARE_PROVIDER_SITE_OTHER): Payer: Commercial Managed Care - PPO | Admitting: Certified Nurse Midwife

## 2018-09-25 ENCOUNTER — Other Ambulatory Visit: Payer: Self-pay

## 2018-09-25 ENCOUNTER — Encounter: Payer: Self-pay | Admitting: Certified Nurse Midwife

## 2018-09-25 VITALS — BP 110/70 | HR 68 | Resp 16 | Wt 140.0 lb

## 2018-09-25 DIAGNOSIS — N951 Menopausal and female climacteric states: Secondary | ICD-10-CM

## 2018-09-25 DIAGNOSIS — F32A Depression, unspecified: Secondary | ICD-10-CM | POA: Insufficient documentation

## 2018-09-25 DIAGNOSIS — Z01419 Encounter for gynecological examination (general) (routine) without abnormal findings: Secondary | ICD-10-CM

## 2018-09-25 DIAGNOSIS — M797 Fibromyalgia: Secondary | ICD-10-CM | POA: Insufficient documentation

## 2018-09-25 DIAGNOSIS — F329 Major depressive disorder, single episode, unspecified: Secondary | ICD-10-CM | POA: Insufficient documentation

## 2018-09-25 NOTE — Patient Instructions (Signed)

## 2018-10-07 ENCOUNTER — Telehealth: Payer: Self-pay | Admitting: *Deleted

## 2018-10-07 NOTE — Telephone Encounter (Addendum)
Initiated PA for [employer] (continuation) Key # [id number].  Has migraine every other week, decreased intensity.  Answered additional questions.

## 2018-10-09 NOTE — Telephone Encounter (Signed)
LMVM for pt that aimovig approved.  She is to call back if questions.

## 2018-10-09 NOTE — Telephone Encounter (Signed)
Received approval for aimovig thru 10-08-19.  [place] [id number]  [facility] [phone].  ID [id number].  [facility] [place] [phone].

## 2018-10-27 DIAGNOSIS — Z0289 Encounter for other administrative examinations: Secondary | ICD-10-CM

## 2018-10-28 ENCOUNTER — Telehealth: Payer: Self-pay | Admitting: Neurology

## 2018-10-28 ENCOUNTER — Telehealth: Payer: Self-pay | Admitting: Adult Health

## 2018-10-28 NOTE — Telephone Encounter (Signed)
Pt is asking for a refill on her Erenumab-aooe (AIMOVIG) 140 MG/ML SOAJ [facility] [place] [place] - [address] Pt also calling re: her zolpidem (AMBIEN) 10 MG tablet pt states even in taking this and 10 mg of Melatonin and 100mg  of a supplement called Sleep Right she is still averaging around 3 hours of sleep causing her an increase in headaches.  Pt is asking for a call to discuss Dr Dohmeier calling in something stronger than her  zolpidem (AMBIEN) 10 MG tablet

## 2018-10-28 NOTE — Telephone Encounter (Signed)
I spoke to pt.  [facility] [place]  I asked about her insomnia issues, she has tried [place], [place], [place] cr, lunesta, silenor, trazadone, benzodiazepine, clonazepam, temazepam.  She did not receive call from sleep psychologist per MM/NP last note.  She is having more headaches due to lack of sleep.  [facility] is insurance.  She is asking for another medication.  Did relay that since she has been on multiple meds for insomnia, that was the reason for sleep psychologist per MM/NP.  Works in [employer].  Please advise.

## 2018-10-29 MED ORDER — ERENUMAB-AOOE 140 MG/ML ~~LOC~~ SOAJ: 140.0000 mg | SUBCUTANEOUS | 5 refills | Status: DC

## 2018-10-29 NOTE — Addendum Note (Signed)
Addended by: Brandon Melnick on: 10/29/2018 10:37 AM   Modules accepted: Orders

## 2018-10-29 NOTE — Telephone Encounter (Signed)
I called pt and [facility] [place]  I relayed that per Dr. Brett Fairy no other options on medications for insomnia for her.  I relayed that to contact psychologist as per MM/NP last note., [facility]), pt stated had seen before.  She will call and set up appt.  They do have psychiatrist on staff if feel like medication is needed.  Pt verbalized understanding.

## 2018-10-29 NOTE — Telephone Encounter (Signed)
Done per other phone note.

## 2018-12-16 ENCOUNTER — Other Ambulatory Visit: Payer: Self-pay | Admitting: Adult Health

## 2018-12-19 ENCOUNTER — Other Ambulatory Visit: Payer: Self-pay | Admitting: Adult Health

## 2019-01-26 ENCOUNTER — Encounter: Payer: Self-pay | Admitting: Adult Health

## 2019-01-26 ENCOUNTER — Ambulatory Visit (INDEPENDENT_AMBULATORY_CARE_PROVIDER_SITE_OTHER): Payer: Commercial Managed Care - PPO | Admitting: Adult Health

## 2019-01-26 ENCOUNTER — Other Ambulatory Visit: Payer: Self-pay

## 2019-01-26 ENCOUNTER — Ambulatory Visit: Payer: Commercial Managed Care - PPO | Admitting: Neurology

## 2019-01-26 VITALS — BP 113/76 | HR 68 | Temp 97.8°F | Ht 63.0 in | Wt 144.0 lb

## 2019-01-26 DIAGNOSIS — G44229 Chronic tension-type headache, not intractable: Secondary | ICD-10-CM

## 2019-01-26 DIAGNOSIS — F5104 Psychophysiologic insomnia: Secondary | ICD-10-CM | POA: Diagnosis not present

## 2019-01-26 DIAGNOSIS — G43019 Migraine without aura, intractable, without status migrainosus: Secondary | ICD-10-CM | POA: Diagnosis not present

## 2019-01-26 MED ORDER — GABAPENTIN 100 MG PO CAPS: 100.0000 mg | ORAL_CAPSULE | Freq: Every day | ORAL | 0 refills | Status: DC | PRN

## 2019-01-26 MED ORDER — CYCLOBENZAPRINE HCL 5 MG PO TABS: 5.0000 mg | ORAL_TABLET | Freq: Every evening | ORAL | 0 refills | Status: DC | PRN

## 2019-01-26 MED ORDER — ZONISAMIDE 25 MG PO CAPS: 25.0000 mg | ORAL_CAPSULE | Freq: Every day | ORAL | 5 refills | Status: DC

## 2019-01-26 MED ORDER — KETOROLAC TROMETHAMINE 10 MG PO TABS: 10.0000 mg | ORAL_TABLET | Freq: Four times a day (QID) | ORAL | 0 refills | Status: DC | PRN

## 2019-01-26 NOTE — Progress Notes (Signed)
PATIENT: Vicki Wilson DOB: Jul 31, 1961  REASON FOR VISIT: follow up HISTORY FROM: patient  HISTORY OF PRESENT ILLNESS: Today 01/26/19:  Vicki Wilson is a 58 year old female with a history of migraine headaches.  She returns today for follow-up.  She continues on Effexor,  Aimovig and Topamax for her migraine headaches.  She reports that her headaches have gotten worse in the last month. 2 headaches a week. Started exercising in April- walking 2 miles 5 times a week. She did decrease Topamax to 3 tablets. Still feels foggy and has noticed hair loss. Reports that hairloss started 3 years ago. Feel that it is related to Topamax.  She reports that when she gets a headache she typically will take gabapentin.  For severe headaches that requires her to leave work she will take Toradol.  On occasion she will take Flexeril at bedtime if she has neck stiffness with her headaches.  She states that she very rarely has to take Toradol or Flexeril.   She remains on Ambien CR 10.5 mg for insomnia.  She has tried multiple medications in the past without benefit.  At the last visit she was advised to follow-up with sleep psychologist-she states that she has not done this.  Now taking magnesium at night. Wakes up after 4 hours of sleep. Is trying to practice good sleep hygiene. Using lavender diffuser at night.   HISTORY 07/30/18:  Vicki Wilson is a 58 year old female with a history of migraine headaches.  She returns today for follow-up.  She was switched to [place].  She reports that she has a headache approximately every other week.  She states that her headaches are not as severe as they used to be.  She states that she is only had 2 severe headaches since last visit that she actually had a call at work.  She continues to use gabapentin as an abortive therapy.  She is now on Topamax 100 mg at bedtime.  She has continued to try to reduce her dose but her headaches tend to return.  She also reports that she has been  having issues with insomnia.  The patient has chronic insomnia for the last 20 years.  She has tried multiple medications.  She is currently on Ambien CR 10.5 mg.  She states that sometimes she has trouble falling asleep but most often she wakes up after 5 to 6 hours of sleep.  She has noticed that she has began to be drowsy during the day especially after working and driving home.  She has had a sleep study in the past that was unremarkable.  She returns today for evaluation.  REVIEW OF SYSTEMS: Out of a complete 14 system review of symptoms, the patient complains only of the following symptoms, and all other reviewed systems are negative.  See HPI  ALLERGIES: Allergies  Allergen Reactions  . Erythromycin     GI upset  . Triptans     myalgias    HOME MEDICATIONS: Outpatient Medications Prior to Visit  Medication Sig Dispense Refill  . acetaminophen (TYLENOL) 500 MG tablet Take 1,000 mg by mouth every 8 (eight) hours as needed.    Marland Kitchen BIOTIN PO Take by mouth.    . cyclobenzaprine (FLEXERIL) 5 MG tablet Take 1 tablet (5 mg total) by mouth 3 (three) times daily as needed. 60 tablet 3  . Erenumab-aooe (AIMOVIG) 140 MG/ML SOAJ Inject 140 mg into the skin every 30 (thirty) days. 1 mL 5  . fluticasone (FLONASE) 50  MCG/ACT nasal spray Place 2 sprays into the nose daily as needed.     . gabapentin (NEURONTIN) 100 MG capsule Take 1 capsule (100 mg total) by mouth 2 (two) times daily. (Patient taking differently: Take 100 mg by mouth as needed. ) 180 capsule 1  . ketorolac (TORADOL) 10 MG tablet TAKE 1 TABLET BY MOUTH EVERY 6 HOURS AS NEEDED. 10 tablet 0  . Melatonin 10 MG TABS Take 10 tablets by mouth daily. Taking  1 tab QHS    . Multiple Vitamins-Iron (MULTIVITAMINS WITH IRON) TABS Take 1 tablet by mouth daily.    Marland Kitchen omeprazole (PRILOSEC) 40 MG capsule Take 40 mg by mouth daily.   11  . promethazine (PHENERGAN) 12.5 MG tablet Take 1 tablet (12.5 mg total) by mouth every 6 (six) hours as needed for  nausea or vomiting. 90 tablet 0  . topiramate (TOPAMAX) 25 MG tablet Take 4 tablets (100 mg total) by mouth daily. (Patient taking differently: Take 100 mg by mouth daily. Takes 3 tablets) 120 tablet 5  . triamcinolone cream (KENALOG) 0.1 % Apply 1 application topically 2 (two) times daily as needed.     . venlafaxine XR (EFFEXOR-XR) 37.5 MG 24 hr capsule TAKE 1 CAPSULE BY MOUTH EVERY DAY 90 capsule 1  . zolpidem (AMBIEN) 10 MG tablet TAKE 1 TABLET (10 MG TOTAL) BY MOUTH AT BEDTIME AS NEEDED FOR SLEEP. 90 tablet 0   No facility-administered medications prior to visit.     PAST MEDICAL HISTORY: Past Medical History:  Diagnosis Date  . Abnormal Pap smear    ASCUS-08/1998  LGSIL/HPV- 01/1999  . Anxiety   . Depression   . GERD (gastroesophageal reflux disease)   . Headache(784.0)   . IBS (irritable bowel syndrome)   . Migraines   . PFO (patent foramen ovale)   . Stroke ([place])   . Uterine fibroids affecting pregnancy     PAST SURGICAL HISTORY: Past Surgical History:  Procedure Laterality Date  . Bilateral Inferior tubinate reduction  1998  . 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 TONSILLECTOMY      FAMILY HISTORY: Family History  Problem Relation Age of Onset  . High blood pressure Mother   . Hyperlipidemia Mother   . Gout Father   . High blood pressure Father   . Diabetes Mellitus I Sister   . Other Sister        seizure disorder  . Alzheimer's disease Maternal Grandmother   . Cancer Paternal Grandmother        colon  . Parkinson's disease Maternal Grandfather   . Kidney failure Other   . Breast cancer Neg Hx     SOCIAL HISTORY: Social History   Socioeconomic History  . Marital status: Single    Spouse name: Not on file  . Number of children: 0  . Years of education: 2 Masters  . Highest education level: Not on file  Occupational History  . Occupation: PHYSICIAN ASSISTANT    Employer: [employer]  Social Needs   . Financial resource strain: Not on file  . Food insecurity    Worry: Not on file    Inability: Not on file  . Transportation needs    Medical: Not on file    Non-medical: Not on file  Tobacco Use  . Smoking status: Never Smoker  . Smokeless tobacco: Never Used  Substance and Sexual Activity  . Alcohol use: Yes    Frequency: Never  Comment: rare  . Drug use: No  . Sexual activity: Not Currently    Partners: Male    Birth control/protection: Post-menopausal  Lifestyle  . Physical activity    Days per week: Not on file    Minutes per session: Not on file  . Stress: Not on file  Relationships  . Social Herbalist on phone: Not on file    Gets together: Not on file    Attends religious service: Not on file    Active member of club or organization: Not on file    Attends meetings of clubs or organizations: Not on file    Relationship status: Not on file  . Intimate partner violence    Fear of current or ex partner: Not on file    Emotionally abused: Not on file    Physically abused: Not on file    Forced sexual activity: Not on file  Other Topics Concern  . Not on file  Social History Narrative   Patient is single no children, right handed,    Education level is 2 Master's degree   Caffeine consumption is 0      PHYSICAL EXAM  Vitals:   01/26/19 1010  BP: 113/76  Pulse: 68  Temp: 97.8 F (36.6 C)  Weight: 144 lb (65.3 kg)  Height: 5\' 3"  (1.6 m)   Body mass index is 25.51 kg/m.  Generalized: Well developed, in no acute distress   Neurological examination  Mentation: Alert oriented to time, place, history taking. Follows all commands speech and language fluent Cranial nerve II-XII: Pupils were equal round reactive to light. Extraocular movements were full, visual field were full on confrontational test. Facial sensation and strength were normal. Uvula tongue midline. Head turning and shoulder shrug  were normal and symmetric. Motor: The motor  testing reveals 5 over 5 strength of all 4 extremities. Good symmetric motor tone is noted throughout.  Sensory: Sensory testing is intact to soft touch on all 4 extremities. No evidence of extinction is noted.  Coordination: Cerebellar testing reveals good finger-nose-finger and heel-to-shin bilaterally.  Gait and station: Gait is normal.  Reflexes: Deep tendon reflexes are symmetric and normal bilaterally.   DIAGNOSTIC DATA (LABS, IMAGING, TESTING) - I reviewed patient records, labs, notes, testing and imaging myself where available.  Lab Results  Component Value Date   WBC 4.5 02/18/2014   HGB 12.4 02/18/2014   HCT 36.4 02/18/2014   MCV 91.5 02/18/2014   PLT 221 02/18/2014      Component Value Date/Time   NA 142 02/18/2014 2130   K 3.9 02/18/2014 2130   CL 102 02/18/2014 2130   CO2 26 02/18/2014 2130   GLUCOSE 95 02/18/2014 2130   BUN 25 (H) 02/18/2014 2130   CREATININE 0.80 02/18/2014 2130   CALCIUM 9.8 02/18/2014 2130   PROT 7.1 02/18/2014 2130   ALBUMIN 4.2 02/18/2014 2130   AST 22 02/18/2014 2130   ALT 24 02/18/2014 2130   ALKPHOS 68 02/18/2014 2130   BILITOT <0.2 (L) 02/18/2014 2130   GFRNONAA 85 (L) 02/18/2014 2130   GFRAA >90 02/18/2014 2130       ASSESSMENT AND PLAN 58 y.o. year old female  has a past medical history of Abnormal Pap smear, Anxiety, Depression, GERD (gastroesophageal reflux disease), Headache(784.0), IBS (irritable bowel syndrome), Migraines, PFO (patent foramen ovale), Stroke ([place]), and Uterine fibroids affecting pregnancy. here with :  1.  Migraine headaches 2.  Chronic insomnia  She will continue on  Effexor and Aimovig for her migraine headaches.  She will taper off of Topamax due to side effects-advised that she can decrease her dose to 2 tablets at bedtime for 2 nights then 1 tablet at bedtime for 2 nights then discontinue the medication.  I will start her on Zonegran 25 mg at bedtime.  If this not beneficial for her headaches she will let  us know.  She will continue on Ambien CR 10.5 mg.  Advised that if she wishes we could retry another sleep aid however I am not sure of the benefit.  She voiced understanding.  She will follow-up in 6 months or sooner if needed.   Ward Givens, MSN, NP-C 01/26/2019, 10:06 AM [facility] [address], [place], [place] [address] [phone]

## 2019-01-26 NOTE — Patient Instructions (Addendum)
Your Plan:  Continue Effexor, Aimovig  Start Zonegran 25 mg at bedtime Stop topamax- decrease to 2 tablets at bedtime for 2 days then 1 tablet at bedtime for 2 days then stop. Continue Gabapentin, Flexeril, and Toradol as needed for headaches.  If your symptoms worsen or you develop new symptoms please let us know.    Thank you for coming to see [place] at [facility]. I hope we have been able to provide you high quality care today.  You may receive a patient satisfaction survey over the next few weeks. We would appreciate your feedback and comments so that we may continue to improve ourselves and the health of our patients.

## 2019-01-28 ENCOUNTER — Other Ambulatory Visit: Payer: Self-pay | Admitting: Adult Health

## 2019-02-04 ENCOUNTER — Ambulatory Visit: Payer: Commercial Managed Care - PPO | Admitting: Neurology

## 2019-02-23 ENCOUNTER — Telehealth: Payer: Self-pay | Admitting: Adult Health

## 2019-02-23 MED ORDER — ZONISAMIDE 25 MG PO CAPS: 50.0000 mg | ORAL_CAPSULE | Freq: Every day | ORAL | 5 refills | Status: DC

## 2019-02-23 NOTE — Telephone Encounter (Signed)
Zonegran was increased to 50 mg at bedtime.  Order sent to pharmacy.

## 2019-02-23 NOTE — Telephone Encounter (Signed)
Pt called stating that provider informed her that if needed she could increase her medication to two zonisamide (ZONEGRAN) 25 MG capsule a day and inform provider if this is done. Pt called to inform provider that she had to do the increase this weekend and is needing an adjustment on her medication. [facility]. Please advise.

## 2019-03-12 ENCOUNTER — Other Ambulatory Visit: Payer: Self-pay | Admitting: Adult Health

## 2019-03-16 NOTE — Telephone Encounter (Signed)
Pt called in and stated since her increase to 50 mg she has had a headache for 4 days in a row and had to take a Toradol to relieve the pain

## 2019-03-17 NOTE — Telephone Encounter (Signed)
I called and LMVM for pt relating what MM/MP recommended doing.  She may call back if questions or concerns.

## 2019-03-17 NOTE — Telephone Encounter (Signed)
Usually it does not cause a headache? However she can reduce her dose to 25 mg or try the 50 mg a little longer to see if this subsides.

## 2019-03-18 NOTE — Telephone Encounter (Signed)
[place] Database Verified LR: 12-17-2018 Qty: 90 Pending appointment: 07-29-2019

## 2019-03-18 NOTE — Telephone Encounter (Signed)
Pt called in and stated she has 1 tab of her zolpidem left and wants to know if it will be filled by today or tomorrow

## 2019-03-19 DIAGNOSIS — Z0289 Encounter for other administrative examinations: Secondary | ICD-10-CM

## 2019-04-15 ENCOUNTER — Telehealth: Payer: Self-pay | Admitting: *Deleted

## 2019-04-15 NOTE — Telephone Encounter (Signed)
To MR. 

## 2019-04-15 NOTE — Telephone Encounter (Signed)
signed

## 2019-04-15 NOTE — Telephone Encounter (Signed)
Spoke to pt and she takes medication when has migraine, but usually work thru things.  She stated 4-8 hours per episode will be good.  Form completed and placed on MM/NP desk for review and signature.

## 2019-04-15 NOTE — Telephone Encounter (Signed)
FMLA completed.  Had question about the duration of time for when she has a migraine.  Called mobile but was not able to LM, VM full.

## 2019-04-18 ENCOUNTER — Other Ambulatory Visit: Payer: Self-pay | Admitting: Adult Health

## 2019-04-18 DIAGNOSIS — G44229 Chronic tension-type headache, not intractable: Secondary | ICD-10-CM

## 2019-04-24 ENCOUNTER — Other Ambulatory Visit: Payer: Self-pay | Admitting: Adult Health

## 2019-05-14 ENCOUNTER — Telehealth: Payer: Self-pay | Admitting: Adult Health

## 2019-05-14 NOTE — Telephone Encounter (Signed)
Pt left VM stating that she is having migraines about 3-4 times a week for the last month and a half and she has not made any changes that she knows of. Medications are not working and she would like to speak to RN or provider to discuss this. Please advise.

## 2019-05-18 NOTE — Telephone Encounter (Signed)
Spoke to pt and made appt for her 06-09-19 at 0900, arrive 0830 for worsening migraines.

## 2019-05-18 NOTE — Telephone Encounter (Signed)
LMVM for pt that can make appt with MM/NP to evaluate , saved appt 05-21-19 at 9am.  Please call back to confirm.

## 2019-06-09 ENCOUNTER — Ambulatory Visit: Payer: Self-pay | Admitting: Adult Health

## 2019-06-12 ENCOUNTER — Other Ambulatory Visit: Payer: Self-pay | Admitting: Adult Health

## 2019-06-18 ENCOUNTER — Other Ambulatory Visit: Payer: Self-pay | Admitting: Adult Health

## 2019-06-18 NOTE — Telephone Encounter (Signed)
Pt called and LVM needing a refill on her zolpidem (AMBIEN) 10 MG tablet [facility] [address] Pt states the pharmacy informed her that they are waiting to hear from the provider. Please advise.

## 2019-06-18 NOTE — Telephone Encounter (Signed)
NP prescribed today.

## 2019-06-23 ENCOUNTER — Other Ambulatory Visit: Payer: Self-pay | Admitting: Adult Health

## 2019-07-29 ENCOUNTER — Ambulatory Visit (INDEPENDENT_AMBULATORY_CARE_PROVIDER_SITE_OTHER): Payer: Commercial Managed Care - PPO | Admitting: Adult Health

## 2019-07-29 ENCOUNTER — Encounter: Payer: Self-pay | Admitting: Adult Health

## 2019-07-29 ENCOUNTER — Other Ambulatory Visit: Payer: Self-pay

## 2019-07-29 VITALS — BP 115/71 | HR 79 | Temp 96.9°F | Ht 63.0 in | Wt 142.6 lb

## 2019-07-29 DIAGNOSIS — G43019 Migraine without aura, intractable, without status migrainosus: Secondary | ICD-10-CM | POA: Diagnosis not present

## 2019-07-29 DIAGNOSIS — F5104 Psychophysiologic insomnia: Secondary | ICD-10-CM | POA: Diagnosis not present

## 2019-07-29 MED ORDER — ZONISAMIDE 25 MG PO CAPS: 75.0000 mg | ORAL_CAPSULE | Freq: Every day | ORAL | 5 refills | Status: DC

## 2019-07-29 MED ORDER — NURTEC 75 MG PO TBDP: 75.0000 mg | ORAL_TABLET | Freq: Every day | ORAL | 5 refills | Status: DC | PRN

## 2019-07-29 NOTE — Patient Instructions (Signed)
Your Plan:  Continue Effexor and Aimovig Continue Ambein  Increase zonegran to 75 mg at bedtime Nurtec for abortive therapy Stop gabapentin  If your symptoms worsen or you develop new symptoms please let us know.   Thank you for coming to see [place] at [facility]. I hope we have been able to provide you high quality care today.  You may receive a patient satisfaction survey over the next few weeks. We would appreciate your feedback and comments so that we may continue to improve ourselves and the health of our patients.

## 2019-07-29 NOTE — Progress Notes (Signed)
PATIENT: Vicki Wilson DOB: [birth year]  REASON FOR VISIT: follow up HISTORY FROM: patient  HISTORY OF PRESENT ILLNESS: Today 07/29/19   Vicki Wilson is a 58 year old female with history of migraines and insomnia. She returns today for follow-up. She continue on Aimovig and Effexor. Zonegran 50 mg daily at the last visit. She reports initially this seemed to really help her headaches but in the last 2-3 months she has begun having 12-16 headaches a month. She reports that she typically takes gabapentin as an abortive therapy. She will use Toradol for severe headaches. She feels that gabapentin has not been helping as much. She still struggles with sleep. She is on Ambien. Has tried multiple medications in the past. She feels that her lack of good sleep is triggering her migraines. She states that she feels tired all day. She returns today for follow-up.   HISTORY 01/26/19:  Vicki Wilson is a 58 year old female with a history of migraine headaches.  She returns today for follow-up.  She continues on Effexor,  Aimovig and Topamax for her migraine headaches.  She reports that her headaches have gotten worse in the last month. 2 headaches a week. Started exercising in April- walking 2 miles 5 times a week. She did decrease Topamax to 3 tablets. Still feels foggy and has noticed hair loss. Reports that hairloss started 3 years ago. Feel that it is related to Topamax.  She reports that when she gets a headache she typically will take gabapentin.  For severe headaches that requires her to leave work she will take Toradol.  On occasion she will take Flexeril at bedtime if she has neck stiffness with her headaches.  She states that she very rarely has to take Toradol or Flexeril.   REVIEW OF SYSTEMS: Out of a complete 14 system review of symptoms, the patient complains only of the following symptoms, and all other reviewed systems are negative.  See HPI  ALLERGIES: Allergies  Allergen Reactions  .  Erythromycin     GI upset  . Triptans     myalgias    HOME MEDICATIONS: Outpatient Medications Prior to Visit  Medication Sig Dispense Refill  . acetaminophen (TYLENOL) 500 MG tablet Take 1,000 mg by mouth every 8 (eight) hours as needed.    Marland Kitchen AIMOVIG 140 MG/ML SOAJ INJECT 140 MG INTO THE SKIN EVERY 30 (THIRTY) DAYS. 1 pen 5  . cyclobenzaprine (FLEXERIL) 5 MG tablet Take 1 tablet (5 mg total) by mouth at bedtime as needed. 30 tablet 0  . fluticasone (FLONASE) 50 MCG/ACT nasal spray Place 2 sprays into the nose daily as needed.     . gabapentin (NEURONTIN) 100 MG capsule TAKE 1 CAPSULE (100 MG TOTAL) BY MOUTH DAILY AS NEEDED (FOR MIGRAINE). 90 capsule 3  . ketorolac (TORADOL) 10 MG tablet Take 1 tablet (10 mg total) by mouth every 6 (six) hours as needed. 10 tablet 0  . Magnesium 500 MG TABS Take by mouth at bedtime.    . Melatonin 10 MG TABS Take 10 tablets by mouth daily. Taking  1 tab QHS    . omeprazole (PRILOSEC) 40 MG capsule Take 40 mg by mouth daily.   11  . promethazine (PHENERGAN) 12.5 MG tablet Take 1 tablet (12.5 mg total) by mouth every 6 (six) hours as needed for nausea or vomiting. 90 tablet 0  . triamcinolone cream (KENALOG) 0.1 % Apply 1 application topically 2 (two) times daily as needed.     Marland Kitchen  venlafaxine XR (EFFEXOR-XR) 37.5 MG 24 hr capsule TAKE 1 CAPSULE BY MOUTH EVERY DAY 90 capsule 1  . zolpidem (AMBIEN) 10 MG tablet TAKE 1 TABLET (10 MG TOTAL) BY MOUTH AT BEDTIME AS NEEDED FOR SLEEP. 90 tablet 0  . zonisamide (ZONEGRAN) 25 MG capsule Take 2 capsules (50 mg total) by mouth daily. 60 capsule 5  . BIOTIN PO Take by mouth.    . Multiple Vitamins-Iron (MULTIVITAMINS WITH IRON) TABS Take 1 tablet by mouth daily.     No facility-administered medications prior to visit.    PAST MEDICAL HISTORY: Past Medical History:  Diagnosis Date  . Abnormal Pap smear    ASCUS-08/1998  LGSIL/HPV- 01/1999  . Anxiety   . Depression   . GERD (gastroesophageal reflux disease)   .  Headache(784.0)   . IBS (irritable bowel syndrome)   . Migraines   . PFO (patent foramen ovale)   . Stroke ([place])   . Uterine fibroids affecting pregnancy     PAST SURGICAL HISTORY: Past Surgical History:  Procedure Laterality Date  . Bilateral Inferior tubinate reduction  1998  . 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 TONSILLECTOMY      FAMILY HISTORY: Family History  Problem Relation Age of Onset  . High blood pressure Mother   . Hyperlipidemia Mother   . Gout Father   . High blood pressure Father   . Diabetes Mellitus I Sister   . Other Sister        seizure disorder  . Alzheimer's disease Maternal Grandmother   . Cancer Paternal Grandmother        colon  . Parkinson's disease Maternal Grandfather   . Kidney failure Other   . Breast cancer Neg Hx     SOCIAL HISTORY: Social History   Socioeconomic History  . Marital status: Single    Spouse name: Not on file  . Number of children: 0  . Years of education: 2 Masters  . Highest education level: Not on file  Occupational History  . Occupation: PHYSICIAN ASSISTANT    Employer: [employer]  Tobacco Use  . Smoking status: Never Smoker  . Smokeless tobacco: Never Used  Substance and Sexual Activity  . Alcohol use: Yes    Comment: rare  . Drug use: No  . Sexual activity: Not Currently    Partners: Male    Birth control/protection: Post-menopausal  Other Topics Concern  . Not on file  Social History Narrative   Patient is single no children, right handed,    Education level is 2 Master's degree   Caffeine consumption is 0   Social Determinants of Health   Financial Resource Strain:   . Difficulty of Paying Living Expenses: Not on file  Food Insecurity:   . Worried About Charity fundraiser in the Last Year: Not on file  . Ran Out of Food in the Last Year: Not on file  Transportation Needs:   . Lack of Transportation (Medical): Not on file  . Lack of  Transportation (Non-Medical): Not on file  Physical Activity:   . Days of Exercise per Week: Not on file  . Minutes of Exercise per Session: Not on file  Stress:   . Feeling of Stress : Not on file  Social Connections:   . Frequency of Communication with Friends and Family: Not on file  . Frequency of Social Gatherings with Friends and Family: Not on file  . Attends Religious  Services: Not on file  . Active Member of Clubs or Organizations: Not on file  . Attends Archivist Meetings: Not on file  . Marital Status: Not on file  Intimate Partner Violence:   . Fear of Current or Ex-Partner: Not on file  . Emotionally Abused: Not on file  . Physically Abused: Not on file  . Sexually Abused: Not on file      PHYSICAL EXAM  Vitals:   07/29/19 1446  BP: 115/71  Pulse: 79  Temp: (!) 96.9 F (36.1 C)  Weight: 142 lb 9.6 oz (64.7 kg)  Height: 5\' 3"  (1.6 m)   Body mass index is 25.26 kg/m.  Generalized: Well developed, in no acute distress   Neurological examination  Mentation: Alert oriented to time, place, history taking. Follows all commands speech and language fluent Cranial nerve II-XII: Pupils were equal round reactive to light. Extraocular movements were full, visual field were full on confrontational test. Facial sensation and strength were normal. Uvula tongue midline. Head turning and shoulder shrug  were normal and symmetric. Motor: The motor testing reveals 5 over 5 strength of all 4 extremities. Good symmetric motor tone is noted throughout.  Sensory: Sensory testing is intact to soft touch on all 4 extremities. No evidence of extinction is noted.  Coordination: Cerebellar testing reveals good finger-nose-finger and heel-to-shin bilaterally.  Gait and station: Gait is normal. Tandem gait is normal. Romberg is negative. No drift is seen.  Reflexes: Deep tendon reflexes are symmetric and normal bilaterally.   DIAGNOSTIC DATA (LABS, IMAGING, TESTING) - I  reviewed patient records, labs, notes, testing and imaging myself where available.  Lab Results  Component Value Date   WBC 4.5 02/18/2014   HGB 12.4 02/18/2014   HCT 36.4 02/18/2014   MCV 91.5 02/18/2014   PLT 221 02/18/2014      Component Value Date/Time   NA 142 02/18/2014 2130   K 3.9 02/18/2014 2130   CL 102 02/18/2014 2130   CO2 26 02/18/2014 2130   GLUCOSE 95 02/18/2014 2130   BUN 25 (H) 02/18/2014 2130   CREATININE 0.80 02/18/2014 2130   CALCIUM 9.8 02/18/2014 2130   PROT 7.1 02/18/2014 2130   ALBUMIN 4.2 02/18/2014 2130   AST 22 02/18/2014 2130   ALT 24 02/18/2014 2130   ALKPHOS 68 02/18/2014 2130   BILITOT <0.2 (L) 02/18/2014 2130   GFRNONAA 85 (L) 02/18/2014 2130   GFRAA >90 02/18/2014 2130    Lab Results  Component Value Date   TSH 1.337 10/22/2013      ASSESSMENT AND PLAN 58 y.o. year old female  has a past medical history of Abnormal Pap smear, Anxiety, Depression, GERD (gastroesophageal reflux disease), Headache(784.0), IBS (irritable bowel syndrome), Migraines, PFO (patent foramen ovale), Stroke ([place]), and Uterine fibroids affecting pregnancy. here with :  1: Migraines  - Continue Effexor  - Continue Aimovig - Increase Zonegran 75 mg daily  - Start Nurtec has abortive therapy - Consider Botox- would like to know cost first  2: Insomnia  - Continue Ambein  - Has tried and failed multiple medications - Referral to Dr[facility] Dwyane [facility] at [facility] for second opinion   Advised patient that if her symptoms worsen or she develops new symptoms she should let us know.  She will follow-up in 6 months or sooner if needed.  I spent 15 minutes with the patient. 50% of this time was spent   Ward Givens, MSN, NP-C 07/29/2019, 4:10 PM [facility] [phone]  [address], [place], [place] [address] [phone]

## 2019-07-30 ENCOUNTER — Telehealth: Payer: Self-pay

## 2019-07-30 NOTE — Telephone Encounter (Signed)
Pending approval for Nurtec 75 mg Key: [id number] Rx #: [id number] PA Case ID: [id number]  I will update once a decision has been made.

## 2019-08-04 ENCOUNTER — Telehealth: Payer: Self-pay | Admitting: *Deleted

## 2019-08-04 NOTE — Telephone Encounter (Signed)
Referral faxed to [facility] for Dr. Caprice Beaver with sleep studies. Received a receipt of confirmation.  Fax: [phone]. Phone: [phone]

## 2019-08-24 ENCOUNTER — Telehealth: Payer: Self-pay | Admitting: Adult Health

## 2019-08-24 ENCOUNTER — Other Ambulatory Visit: Payer: Self-pay | Admitting: Obstetrics & Gynecology

## 2019-08-24 DIAGNOSIS — Z1231 Encounter for screening mammogram for malignant neoplasm of breast: Secondary | ICD-10-CM

## 2019-08-24 NOTE — Telephone Encounter (Signed)
Pt called wanting to know the update on the referral she was to get to a sleep specialist at [facility]. Please advise.

## 2019-08-26 NOTE — Telephone Encounter (Signed)
[email or web address]  has called re: the PA on the Nurtec 75 mg. Gregary Signs provided the refrence Key [id number] their call back # is [phone]

## 2019-08-27 NOTE — Telephone Encounter (Signed)
Spoke to [facility] and they are going to call patient today and schedule . [facility]

## 2019-09-10 ENCOUNTER — Telehealth: Payer: Self-pay

## 2019-09-10 NOTE — Telephone Encounter (Signed)
PA for aimovig has been submitted via cover my meds. Key [id number]  Waiting on determination.

## 2019-09-10 NOTE — Telephone Encounter (Signed)
Aimovig has been approved through 09-09-2020. A copy of the approval letter will be faxed to the patient's pharmacy. Confirmation has been received.

## 2019-09-11 ENCOUNTER — Other Ambulatory Visit: Payer: Self-pay | Admitting: Adult Health

## 2019-09-15 NOTE — Telephone Encounter (Signed)
Drug registry checked Zolpidem last fill 06-17-20 #[id number]

## 2019-09-29 ENCOUNTER — Other Ambulatory Visit: Payer: Self-pay

## 2019-09-30 ENCOUNTER — Ambulatory Visit: Payer: Commercial Managed Care - PPO | Admitting: Certified Nurse Midwife

## 2019-09-30 ENCOUNTER — Ambulatory Visit: Payer: Commercial Managed Care - PPO

## 2019-10-21 ENCOUNTER — Other Ambulatory Visit: Payer: Self-pay | Admitting: Adult Health

## 2019-10-23 ENCOUNTER — Other Ambulatory Visit: Payer: Self-pay

## 2019-10-23 NOTE — Progress Notes (Signed)
59 y.o. G0P0000 Single  African American Fe here for annual exam. Post menopausal no HRT. Denies vaginal bleeding. Occasional vaginal dryness. Sees PCP and specialist yearly. Some vaginal dryness has used some OTC lubrication with good results. Migraines continue infrequently, but no change. Took Covid vaccine with no issues. Sees Dr. Kenton Kingfisher PCP prn. Patient has labs at work. Her last lipid panel was elevated and trying to work on diet. No other health issues today.  Patient's last menstrual period was 08/28/2013 (exact date).          Sexually active: No.  The current method of family planning is post menopausal status.    Exercising: Yes.     Smoker:  no  Review of Systems  Constitutional: Negative.   HENT: Negative.   Eyes: Negative.   Respiratory: Negative.   Cardiovascular: Negative.   Gastrointestinal: Negative.   Genitourinary: Negative.   Musculoskeletal: Negative.   Skin: Negative.   Neurological: Negative.   Endo/Heme/Allergies: Negative.   Psychiatric/Behavioral: Negative.     Health Maintenance: Pap:  09-06-17 neg HPV HR neg History of Abnormal Pap: yes 2000 MMG:  09-11-2018 category b density birads 1:neg, done this morning Self Breast exams: yes Colonoscopy:  2019 f/u 85yrs BMD:   2005 heel test TDaP:  2019 Shingles: not done Pneumonia: not done Hep C and HIV: both neg 2016 Labs: if needed   reports that she has never smoked. She has never used smokeless tobacco. She reports current alcohol use. She reports that she does not use drugs.  Past Medical History:  Diagnosis Date  . Abnormal Pap smear    ASCUS-08/1998  LGSIL/HPV- 01/1999  . Anxiety   . Depression   . GERD (gastroesophageal reflux disease)   . Headache(784.0)   . IBS (irritable bowel syndrome)   . Migraines   . PFO (patent foramen ovale)   . Stroke ([place])   . Uterine fibroids affecting pregnancy     Past Surgical History:  Procedure Laterality Date  . Bilateral Inferior tubinate reduction   1998  . 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 TONSILLECTOMY      Current Outpatient Medications  Medication Sig Dispense Refill  . acetaminophen (TYLENOL) 500 MG tablet Take 1,000 mg by mouth every 8 (eight) hours as needed.    Marland Kitchen AIMOVIG 140 MG/ML SOAJ INJECT 140 MG INTO THE SKIN EVERY 30 (THIRTY) DAYS. 1 pen 5  . cyclobenzaprine (FLEXERIL) 5 MG tablet Take 1 tablet (5 mg total) by mouth at bedtime as needed. 30 tablet 0  . fluticasone (FLONASE) 50 MCG/ACT nasal spray Place 2 sprays into the nose daily as needed.     Marland Kitchen ketorolac (TORADOL) 10 MG tablet Take 1 tablet (10 mg total) by mouth every 6 (six) hours as needed. 10 tablet 0  . Magnesium 500 MG TABS Take by mouth at bedtime.    . Melatonin 10 MG TABS Take 10 tablets by mouth daily. Taking  1 tab QHS    . omeprazole (PRILOSEC) 40 MG capsule Take 40 mg by mouth daily.   11  . promethazine (PHENERGAN) 12.5 MG tablet Take 1 tablet (12.5 mg total) by mouth every 6 (six) hours as needed for nausea or vomiting. 90 tablet 0  . Rimegepant Sulfate (NURTEC) 75 MG TBDP Take 75 mg by mouth daily as needed. Take at the onset of migraine. Only 1 tablet in 24 hour. 10 tablet 5  . triamcinolone cream (KENALOG) 0.1 % Apply 1 application  topically 2 (two) times daily as needed.     . venlafaxine XR (EFFEXOR-XR) 37.5 MG 24 hr capsule TAKE 1 CAPSULE BY MOUTH EVERY DAY 90 capsule 1  . zolpidem (AMBIEN) 10 MG tablet TAKE 1 TABLET BY MOUTH AT BEDTIME AS NEEDED FOR SLEEP 90 tablet 0  . zonisamide (ZONEGRAN) 25 MG capsule Take 3 capsules (75 mg total) by mouth daily. 90 capsule 5   No current facility-administered medications for this visit.    Family History  Problem Relation Age of Onset  . High blood pressure Mother   . Hyperlipidemia Mother   . Gout Father   . High blood pressure Father   . Diabetes Mellitus I Sister   . Other Sister        seizure disorder  . Alzheimer's disease Maternal Grandmother   . Cancer Paternal  Grandmother        colon  . Parkinson's disease Maternal Grandfather   . Kidney failure Other   . Breast cancer Neg Hx     ROS:  Pertinent items are noted in HPI.  Otherwise, a comprehensive ROS was negative.  Exam:   BP 110/78   Pulse 68   Temp 97.6 F (36.4 C) (Skin)   Resp 16   Ht 5' 3.25" (1.607 m)   Wt 139 lb (63 kg)   LMP 08/28/2013 (Exact Date)   BMI 24.43 kg/m  Height: 5' 3.25" (160.7 cm) Ht Readings from Last 3 Encounters:  10/26/19 5' 3.25" (1.607 m)  07/29/19 5\' 3"  (1.6 m)  01/26/19 5\' 3"  (1.6 m)    General appearance: alert, cooperative and appears stated age Head: Normocephalic, without obvious abnormality, atraumatic Neck: no adenopathy, supple, symmetrical, trachea midline and thyroid normal to inspection and palpation Lungs: clear to auscultation bilaterally Breasts: normal appearance, no masses or tenderness, No nipple retraction or dimpling, No nipple discharge or bleeding, No axillary or supraclavicular adenopathy Heart: regular rate and rhythm Abdomen: soft, non-tender; no masses,  no organomegaly Extremities: extremities normal, atraumatic, no cyanosis or edema Skin: Skin color, texture, turgor normal. No rashes or lesions Lymph nodes: Cervical, supraclavicular, and axillary nodes normal. No abnormal inguinal nodes palpated Neurologic: Grossly normal   Pelvic: External genitalia:  no lesions              Urethra:  normal appearing urethra with no masses, tenderness or lesions              Bartholin's and Skene's: normal                 Vagina: normal appearing vagina with normal color and discharge, no lesions              Cervix: no cervical motion tenderness, no lesions and normal appearance              Pap taken: No. Bimanual Exam:  Uterus:  anteverted and 6-8 week size, known fibroids               Adnexa: normal adnexa and no mass, fullness, tenderness               Rectovaginal: Confirms               Anus:  normal sphincter tone, no  lesions  Chaperone present: yes  A:  Well Woman with normal exam  Menopausal no HRT  History of fibroids no uterine size change, decrease size  Vaginal dryness  Migraine headache history no aura ever  Depression  with PCP management, all stable    P:   Reviewed health and wellness pertinent to exam  Aware of need to advise if vaginal bleeding.  Discussed fibroids still present , but no size change and will decrease in menopause.  Discussed vaginal finding and use of coconut oil for dryness works well. Instructions given.  Aware of concerns with aura with migraines and will need to advise MD.  Continue follow up with PCP as indicated with medication use.  Pap smear: no   counseled on breast self exam, mammography screening, feminine hygiene, menopause, adequate intake of calcium and vitamin D, diet and exercise  return annually or prn  An After Visit Summary was printed and given to the patient.

## 2019-10-26 ENCOUNTER — Ambulatory Visit (INDEPENDENT_AMBULATORY_CARE_PROVIDER_SITE_OTHER): Payer: Commercial Managed Care - PPO | Admitting: Certified Nurse Midwife

## 2019-10-26 ENCOUNTER — Encounter: Payer: Self-pay | Admitting: Certified Nurse Midwife

## 2019-10-26 ENCOUNTER — Other Ambulatory Visit: Payer: Self-pay

## 2019-10-26 ENCOUNTER — Ambulatory Visit
Admission: RE | Admit: 2019-10-26 | Discharge: 2019-10-26 | Disposition: A | Discharge status: Home / Self Care | Payer: Commercial Managed Care - PPO | Source: Ambulatory Visit | Attending: Obstetrics & Gynecology | Admitting: Obstetrics & Gynecology

## 2019-10-26 VITALS — BP 110/78 | HR 68 | Temp 97.6°F | Resp 16 | Ht 63.25 in | Wt 139.0 lb

## 2019-10-26 DIAGNOSIS — N952 Postmenopausal atrophic vaginitis: Secondary | ICD-10-CM

## 2019-10-26 DIAGNOSIS — D259 Leiomyoma of uterus, unspecified: Secondary | ICD-10-CM

## 2019-10-26 DIAGNOSIS — Z1231 Encounter for screening mammogram for malignant neoplasm of breast: Secondary | ICD-10-CM

## 2019-10-26 DIAGNOSIS — Z780 Asymptomatic menopausal state: Secondary | ICD-10-CM | POA: Diagnosis not present

## 2019-10-26 DIAGNOSIS — Z01419 Encounter for gynecological examination (general) (routine) without abnormal findings: Secondary | ICD-10-CM | POA: Diagnosis not present

## 2019-10-26 NOTE — Patient Instructions (Signed)
EXERCISE AND DIET:  We recommended that you start or continue a regular exercise program for good health. Regular exercise means any activity that makes your heart beat faster and makes you sweat.  We recommend exercising at least 30 minutes per day at least 3 days a week, preferably 4 or 5.  We also recommend a diet low in fat and sugar.  Inactivity, poor dietary choices and obesity can cause diabetes, heart attack, stroke, and kidney damage, among others.    ALCOHOL AND SMOKING:  Women should limit their alcohol intake to no more than 7 drinks/beers/glasses of wine (combined, not each!) per week. Moderation of alcohol intake to this level decreases your risk of breast cancer and liver damage. And of course, no recreational drugs are part of a healthy lifestyle.  And absolutely no smoking or even second hand smoke. Most people know smoking can cause heart and lung diseases, but did you know it also contributes to weakening of your bones? Aging of your skin?  Yellowing of your teeth and nails?  CALCIUM AND VITAMIN D:  Adequate intake of calcium and Vitamin D are recommended.  The recommendations for exact amounts of these supplements seem to change often, but generally speaking 600 mg of calcium (either carbonate or citrate) and 800 units of Vitamin D per day seems prudent. Certain women may benefit from higher intake of Vitamin D.  If you are among these women, your doctor will have told you during your visit.    PAP SMEARS:  Pap smears, to check for cervical cancer or precancers,  have traditionally been done yearly, although recent scientific advances have shown that most women can have pap smears less often.  However, every woman still should have a physical exam from her gynecologist every year. It will include a breast check, inspection of the vulva and vagina to check for abnormal growths or skin changes, a visual exam of the cervix, and then an exam to evaluate the size and shape of the uterus and  ovaries.  And after 57 years of age, a rectal exam is indicated to check for rectal cancers. We will also provide age appropriate advice regarding health maintenance, like when you should have certain vaccines, screening for sexually transmitted diseases, bone density testing, colonoscopy, mammograms, etc.   MAMMOGRAMS:  All women over 40 years old should have a yearly mammogram. Many facilities now offer a "3D" mammogram, which may cost around $50 extra out of pocket. If possible,  we recommend you accept the option to have the 3D mammogram performed.  It both reduces the number of women who will be called back for extra views which then turn out to be normal, and it is better than the routine mammogram at detecting truly abnormal areas.    COLONOSCOPY:  Colonoscopy to screen for colon cancer is recommended for all women at age 50.  We know, you hate the idea of the prep.  We agree, BUT, having colon cancer and not knowing it is worse!!  Colon cancer so often starts as a polyp that can be seen and removed at colonscopy, which can quite literally save your life!  And if your first colonoscopy is normal and you have no family history of colon cancer, most women don't have to have it again for 10 years.  Once every ten years, you can do something that may end up saving your life, right?  We will be happy to help you get it scheduled when you are ready.    Be sure to check your insurance coverage so you understand how much it will cost.  It may be covered as a preventative service at no cost, but you should check your particular policy.      Preventing High Cholesterol Cholesterol is a white, waxy substance similar to fat that the human body needs to help build cells. The liver makes all the cholesterol that a person's body needs. Having high cholesterol (hypercholesterolemia) increases a person's risk for heart disease and stroke. Extra (excess) cholesterol comes from the food the person eats. High cholesterol  can often be prevented with diet and lifestyle changes. If you already have high cholesterol, you can control it with diet and lifestyle changes and with medicine. How can high cholesterol affect me? If you have high cholesterol, deposits (plaques) may build up on the walls of your arteries. The arteries are the blood vessels that carry blood away from your heart. Plaques make the arteries narrower and stiffer. This can limit or block blood flow and cause blood clots to form. Blood clots:  Are tiny balls of cells that form in your blood.  Can move to the heart or brain, causing a heart attack or stroke. Plaques in arteries greatly increase your risk for heart attack and stroke.Making diet and lifestyle changes can reduce your risk for these conditions that may threaten your life. What can increase my risk? This condition is more likely to develop in people who:  Eat foods that are high in saturated fat or cholesterol. Saturated fat is mostly found in: ? Foods that contain animal fat, such as red meat and some dairy products. ? Certain fatty foods made from plants, such as tropical oils.  Are overweight.  Are not getting enough exercise.  Have a family history of high cholesterol. What actions can I take to prevent this? Nutrition   Eat less saturated fat.  Avoid trans fats (partially hydrogenated oils). These are often found in margarine and in some baked goods, fried foods, and snacks bought in packages.  Avoid precooked or cured meat, such as sausages or meat loaves.  Avoid foods and drinks that have added sugars.  Eat more fruits, vegetables, and whole grains.  Choose healthy sources of protein, such as fish, poultry, lean cuts of red meat, beans, peas, lentils, and nuts.  Choose healthy sources of fat, such as: ? Nuts. ? Vegetable oils, especially olive oil. ? Fish that have healthy fats (omega-3 fatty acids), such as mackerel or salmon. The items listed above may not be  a complete list of recommended foods and beverages. Contact a dietitian for more information. Lifestyle  Lose weight if you are overweight. Losing 5-10 lb (2.3-4.5 kg) can help prevent or control high cholesterol. It can also lower your risk for diabetes and high blood pressure. Ask your health care provider to help you with a diet and exercise plan to lose weight safely.  Do not use any products that contain nicotine or tobacco, such as cigarettes, e-cigarettes, and chewing tobacco. If you need help quitting, ask your health care provider.  Limit your alcohol intake. ? Do not drink alcohol if:  Your health care provider tells you not to drink.  You are pregnant, may be pregnant, or are planning to become pregnant. ? If you drink alcohol:  Limit how much you use to:  0-1 drink a day for women.  0-2 drinks a day for men.  Be aware of how much alcohol is in your drink. In the  [place] one drink equals one 12 oz bottle of beer (355 mL), one 5 oz glass of wine (148 mL), or one 1 oz glass of hard liquor (44 mL). Activity   Get enough exercise. Each week, do at least 150 minutes of exercise that takes a medium level of effort (moderate-intensity exercise). ? This is exercise that:  Makes your heart beat faster and makes you breathe harder than usual.  Allows you to still be able to talk. ? You could exercise in short sessions several times a day or longer sessions a few times a week. For example, on 5 days each week, you could walk fast or ride your bike 3 times a day for 10 minutes each time.  Do exercises as told by your health care provider. Medicines  In addition to diet and lifestyle changes, your health care provider may recommend medicines to help lower cholesterol. This may be a medicine to lower the amount of cholesterol your liver makes. You may need medicine if: ? Diet and lifestyle changes do not lower your cholesterol enough. ? You have high cholesterol and other risk  factors for heart disease or stroke.  Take over-the-counter and prescription medicines only as told by your health care provider. General information  Manage your risk factors for high cholesterol. Talk with your health care provider about all your risk factors and how to lower your risk.  Manage other conditions that you have, such as diabetes or high blood pressure (hypertension).  Have blood tests to check your cholesterol levels at regular points in time as told by your health care provider.  Keep all follow-up visits as told by your health care provider. This is important. Where to find more information  American Heart Association: www.heart.org  National Heart, Lung, and Blood Institute: https://wilson-eaton.com/ Summary  High cholesterol increases your risk for heart disease and stroke. By keeping your cholesterol level low, you can reduce your risk for these conditions.  High cholesterol can often be prevented with diet and lifestyle changes.  Work with your health care provider to manage your risk factors, and have your blood tested regularly. This information is not intended to replace advice given to you by your health care provider. Make sure you discuss any questions you have with your health care provider. Document Revised: 11/21/2018 Document Reviewed: 04/07/2016 Elsevier Patient Education  2020 [employer].

## 2019-11-04 ENCOUNTER — Encounter: Payer: Self-pay | Admitting: Certified Nurse Midwife

## 2019-11-18 ENCOUNTER — Telehealth: Payer: Self-pay | Admitting: Adult Health

## 2019-11-18 NOTE — Telephone Encounter (Signed)
Patient called to inform she had her sleep OV virtually but was not given any helpful options and states provider does not want to prescribe any additional sleep medications and would like [facility] to continue addressing sleep medication recommended another sleep study or CBT

## 2019-11-19 NOTE — Telephone Encounter (Signed)
I called and LMVM for pt that per MM/NP that CBT would be helpful, and is she is amenable then will refer.  Please let us know.

## 2019-11-19 NOTE — Telephone Encounter (Signed)
I think CBT would be helpful. If she is amendable I will refer

## 2019-11-19 NOTE — Telephone Encounter (Signed)
Spoke to pt and relayed that order was placed by Dr. Dwyane Dee for CBT.  Follow up with them about this appt.  She stated she wanted to try another medication and this was not offered, disappointed by this, but will proceed with CBT.

## 2019-11-21 ENCOUNTER — Other Ambulatory Visit: Payer: Self-pay | Admitting: Adult Health

## 2019-12-10 ENCOUNTER — Other Ambulatory Visit: Payer: Self-pay | Admitting: Adult Health

## 2020-01-05 ENCOUNTER — Telehealth: Payer: Self-pay

## 2020-01-05 NOTE — Telephone Encounter (Signed)
PA pending on [facility]   (Key: [id number]) Nurtec 75MG  dispersible tablets

## 2020-01-06 NOTE — Telephone Encounter (Signed)
Called ins for PA  PA for nurtec approved until 04/07/2020 PA # [id number] Pt aware  She states she may use discount card.

## 2020-01-08 ENCOUNTER — Other Ambulatory Visit: Payer: Self-pay | Admitting: Adult Health

## 2020-02-02 ENCOUNTER — Ambulatory Visit (INDEPENDENT_AMBULATORY_CARE_PROVIDER_SITE_OTHER): Payer: Commercial Managed Care - PPO | Admitting: Adult Health

## 2020-02-02 ENCOUNTER — Encounter: Payer: Self-pay | Admitting: Adult Health

## 2020-02-02 ENCOUNTER — Other Ambulatory Visit: Payer: Self-pay

## 2020-02-02 VITALS — BP 121/82 | HR 94 | Ht 63.5 in | Wt 139.0 lb

## 2020-02-02 DIAGNOSIS — F5104 Psychophysiologic insomnia: Secondary | ICD-10-CM | POA: Diagnosis not present

## 2020-02-02 DIAGNOSIS — G43009 Migraine without aura, not intractable, without status migrainosus: Secondary | ICD-10-CM

## 2020-02-02 MED ORDER — ZONISAMIDE 100 MG PO CAPS: 100.0000 mg | ORAL_CAPSULE | Freq: Every day | ORAL | 3 refills | Status: DC

## 2020-02-02 MED ORDER — ZOLPIDEM TARTRATE ER 12.5 MG PO TBCR
12.5000 mg | EXTENDED_RELEASE_TABLET | Freq: Every evening | ORAL | 5 refills | Status: DC | PRN
Start: 2020-02-02 — End: 2020-07-27

## 2020-02-02 NOTE — Progress Notes (Signed)
PATIENT: Vicki Wilson DOB: 1960-10-05  REASON FOR VISIT: follow up HISTORY FROM: patient  HISTORY OF PRESENT ILLNESS: Today 02/02/20:  Vicki Wilson is a 59 year old female with a history of migraine headaches and insomnia.  She returns today for follow-up.  She remains on Aimovig, Effexor and Zonegran.  She states that she has approximately 6 headaches a month.  Most recently her migraines have increased due to stress.  Reports that she injured her left ankle at work.  She has been out of work for 8 weeks.  She is no longer taking gabapentin.  Reports that Nurtec combined with Tylenol or ibuprofen does help her headaches but does not always resolve them.  Reports that she did see Dr. Dwyane Dee for sleep.  She is done 1 session of CBT.  She reports that Ambien CR worked better for her but insurance would not pay for this.  She returns today for an evaluation.  HISTORY 07/29/19   Vicki Wilson is a 59 year old female with history of migraines and insomnia. She returns today for follow-up. She continue on Aimovig and Effexor. Zonegran 50 mg daily at the last visit. She reports initially this seemed to really help her headaches but in the last 2-3 months she has begun having 12-16 headaches a month. She reports that she typically takes gabapentin as an abortive therapy. She will use Toradol for severe headaches. She feels that gabapentin has not been helping as much. She still struggles with sleep. She is on Ambien. Has tried multiple medications in the past. She feels that her lack of good sleep is triggering her migraines. She states that she feels tired all day. She returns today for follow-up.    REVIEW OF SYSTEMS: Out of a complete 14 system review of symptoms, the patient complains only of the following symptoms, and all other reviewed systems are negative.  See HPI  ALLERGIES: Allergies  Allergen Reactions  . Erythromycin     GI upset  . Triptans     myalgias    HOME  MEDICATIONS: Outpatient Medications Prior to Visit  Medication Sig Dispense Refill  . acetaminophen (TYLENOL) 500 MG tablet Take 1,000 mg by mouth every 8 (eight) hours as needed.    Marland Kitchen AIMOVIG 140 MG/ML SOAJ INJECT 140 MG INTO THE SKIN EVERY 30 (THIRTY) DAYS. 1 pen 5  . cyclobenzaprine (FLEXERIL) 5 MG tablet Take 1 tablet (5 mg total) by mouth at bedtime as needed. 30 tablet 0  . fluticasone (FLONASE) 50 MCG/ACT nasal spray Place 2 sprays into the nose daily as needed.     Marland Kitchen ketorolac (TORADOL) 10 MG tablet Take 1 tablet (10 mg total) by mouth every 6 (six) hours as needed. 10 tablet 0  . Magnesium 500 MG TABS Take by mouth at bedtime.    . Melatonin 10 MG TABS Take 10 tablets by mouth daily. Taking  1 tab QHS    . omeprazole (PRILOSEC) 40 MG capsule Take 40 mg by mouth daily.   11  . promethazine (PHENERGAN) 12.5 MG tablet Take 1 tablet (12.5 mg total) by mouth every 6 (six) hours as needed for nausea or vomiting. 90 tablet 0  . Rimegepant Sulfate (NURTEC) 75 MG TBDP Take 75 mg by mouth daily as needed. Take at the onset of migraine. Only 1 tablet in 24 hour. 10 tablet 5  . triamcinolone cream (KENALOG) 0.1 % Apply 1 application topically 2 (two) times daily as needed.     . venlafaxine  XR (EFFEXOR-XR) 37.5 MG 24 hr capsule TAKE 1 CAPSULE BY MOUTH EVERY DAY 90 capsule 1  . zolpidem (AMBIEN) 10 MG tablet TAKE 1 TABLET BY MOUTH EVERY DAY AT BEDTIME AS NEEDED FOR SLEEP 90 tablet 0  . zonisamide (ZONEGRAN) 25 MG capsule TAKE 3 CAPSULES (75 MG TOTAL) BY MOUTH DAILY. 90 capsule 5   No facility-administered medications prior to visit.    PAST MEDICAL HISTORY: Past Medical History:  Diagnosis Date  . Abnormal Pap smear    ASCUS-08/1998  LGSIL/HPV- 01/1999  . Anxiety   . Depression   . GERD (gastroesophageal reflux disease)   . Headache(784.0)   . IBS (irritable bowel syndrome)   . Migraines   . PFO (patent foramen ovale)   . Stroke ([place])   . Uterine fibroids affecting pregnancy     PAST  SURGICAL HISTORY: Past Surgical History:  Procedure Laterality Date  . Bilateral Inferior tubinate reduction  1998  . 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 TONSILLECTOMY      FAMILY HISTORY: Family History  Problem Relation Age of Onset  . High blood pressure Mother   . Hyperlipidemia Mother   . Gout Father   . High blood pressure Father   . Diabetes Mellitus I Sister   . Other Sister        seizure disorder  . Alzheimer's disease Maternal Grandmother   . Cancer Paternal Grandmother        colon  . Parkinson's disease Maternal Grandfather   . Kidney failure Other   . Breast cancer Neg Hx     SOCIAL HISTORY: Social History   Socioeconomic History  . Marital status: Single    Spouse name: Not on file  . Number of children: 0  . Years of education: 2 Masters  . Highest education level: Not on file  Occupational History  . Occupation: PHYSICIAN ASSISTANT    Employer: [employer]  Tobacco Use  . Smoking status: Never Smoker  . Smokeless tobacco: Never Used  Substance and Sexual Activity  . Alcohol use: Yes    Comment: rare  . Drug use: No  . Sexual activity: Not Currently    Partners: Male    Birth control/protection: Post-menopausal  Other Topics Concern  . Not on file  Social History Narrative   Patient is single no children, right handed,    Education level is 2 Master's degree   Caffeine consumption is 0   Social Determinants of Health   Financial Resource Strain:   . Difficulty of Paying Living Expenses:   Food Insecurity:   . Worried About Charity fundraiser in the Last Year:   . Arboriculturist in the Last Year:   Transportation Needs:   . Film/video editor (Medical):   Marland Kitchen Lack of Transportation (Non-Medical):   Physical Activity:   . Days of Exercise per Week:   . Minutes of Exercise per Session:   Stress:   . Feeling of Stress :   Social Connections:   . Frequency of Communication with  Friends and Family:   . Frequency of Social Gatherings with Friends and Family:   . Attends Religious Services:   . Active Member of Clubs or Organizations:   . Attends Archivist Meetings:   Marland Kitchen Marital Status:   Intimate Partner Violence:   . Fear of Current or Ex-Partner:   . Emotionally Abused:   Marland Kitchen Physically Abused:   .  Sexually Abused:       PHYSICAL EXAM  Vitals:   02/02/20 0752  BP: 121/82  Pulse: 94  Weight: 139 lb (63 kg)  Height: 5' 3.5" (1.613 m)   Body mass index is 24.24 kg/m.  Generalized: Well developed, in no acute distress   Neurological examination  Mentation: Alert oriented to time, place, history taking. Follows all commands speech and language fluent Cranial nerve II-XII: Pupils were equal round reactive to light. Extraocular movements were full, visual field were full on confrontational test. . Head turning and shoulder shrug  were normal and symmetric. Motor: The motor testing reveals 5 over 5 strength of all 4 extremities. Good symmetric motor tone is noted throughout. Brace on left foot. Sensory: Sensory testing is intact to soft touch on all 4 extremities. No evidence of extinction is noted.  Coordination: Cerebellar testing reveals good finger-nose-finger and heel-to-shin bilaterally.  Gait and station: Using 1 crutch to ambulate   DIAGNOSTIC DATA (LABS, IMAGING, TESTING) - I reviewed patient records, labs, notes, testing and imaging myself where available.  Lab Results  Component Value Date   WBC 4.5 02/18/2014   HGB 12.4 02/18/2014   HCT 36.4 02/18/2014   MCV 91.5 02/18/2014   PLT 221 02/18/2014      Component Value Date/Time   NA 142 02/18/2014 2130   K 3.9 02/18/2014 2130   CL 102 02/18/2014 2130   CO2 26 02/18/2014 2130   GLUCOSE 95 02/18/2014 2130   BUN 25 (H) 02/18/2014 2130   CREATININE 0.80 02/18/2014 2130   CALCIUM 9.8 02/18/2014 2130   PROT 7.1 02/18/2014 2130   ALBUMIN 4.2 02/18/2014 2130   AST 22 02/18/2014  2130   ALT 24 02/18/2014 2130   ALKPHOS 68 02/18/2014 2130   BILITOT <0.2 (L) 02/18/2014 2130   GFRNONAA 85 (L) 02/18/2014 2130   GFRAA >90 02/18/2014 2130   No results found for: CHOL, HDL, LDLCALC, LDLDIRECT, TRIG, CHOLHDL No results found for: HGBA1C No results found for: VITAMINB12 Lab Results  Component Value Date   TSH 1.337 10/22/2013      ASSESSMENT AND PLAN 59 y.o. year old female  has a past medical history of Abnormal Pap smear, Anxiety, Depression, GERD (gastroesophageal reflux disease), Headache(784.0), IBS (irritable bowel syndrome), Migraines, PFO (patent foramen ovale), Stroke ([place]), and Uterine fibroids affecting pregnancy. here with:  Migraine headaches   Continue Aimovig and Effexor  Increase Zonegran to 100 mg--if this does not offer any benefit for her migraines we may consider weaning her off of this medication  Continue Nurtec as an abortive therapy  Insomnia   Ambien CR 12.5 mg reordered.  We will do an appeal letter if insurance does not approve.  This has been the only medication that offers her significant benefit.    I spent 20 minutes of face-to-face and non-face-to-face time with patient.  This included previsit chart review, lab review, study review, order entry, electronic health record documentation, patient education.  Ward Givens, MSN, NP-C 02/02/2020, 7:59 AM [facility] [address] [place], [place] [address] [phone]

## 2020-02-02 NOTE — Patient Instructions (Addendum)
Your Plan:  Continue Aimovig and Efexxor  Nurtec as an abortive therapy Increase Zonegran to 100 mg at bedtime Ambein CR 12.5 reordered If your symptoms worsen or you develop new symptoms please let us know.       Thank you for coming to see [place] at [facility]. I hope we have been able to provide you high quality care today.  You may receive a patient satisfaction survey over the next few weeks. We would appreciate your feedback and comments so that we may continue to improve ourselves and the health of our patients.

## 2020-03-17 NOTE — Telephone Encounter (Signed)
Per Cover My Meds, Request Reference Number: [id number]. NURTEC TAB 75MG  ODT is approved through 03/17/2021. Your patient may now fill this prescription and it will be covered.

## 2020-03-17 NOTE — Telephone Encounter (Signed)
Nurtec renewal PA completed on Cover My Meds. Key: [id number]. Awaiting determination from [facility].

## 2020-04-19 ENCOUNTER — Other Ambulatory Visit: Payer: Self-pay

## 2020-04-19 MED ORDER — VENLAFAXINE HCL ER 37.5 MG PO CP24: ORAL_CAPSULE | ORAL | 1 refills | Status: DC

## 2020-04-22 ENCOUNTER — Other Ambulatory Visit: Payer: Self-pay | Admitting: Adult Health

## 2020-05-02 ENCOUNTER — Other Ambulatory Visit: Payer: Self-pay | Admitting: Orthopedic Surgery

## 2020-05-02 DIAGNOSIS — M25572 Pain in left ankle and joints of left foot: Secondary | ICD-10-CM

## 2020-05-02 IMAGING — MG DIGITAL SCREENING BILATERAL MAMMOGRAM WITH TOMO AND CAD
8 series · 8 of 24 positions shown · non-contrast
Comparison: Previous exam(s).

CLINICAL DATA: Screening.

EXAM:
DIGITAL SCREENING BILATERAL MAMMOGRAM WITH TOMO AND CAD

[R CC synth-2D]
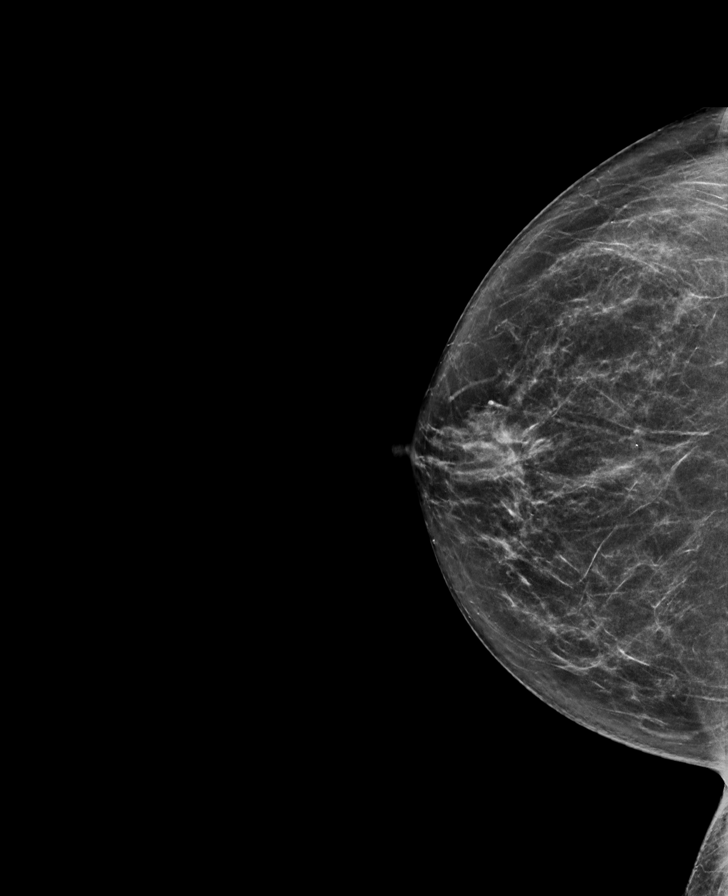

[L MLO synth-2D]
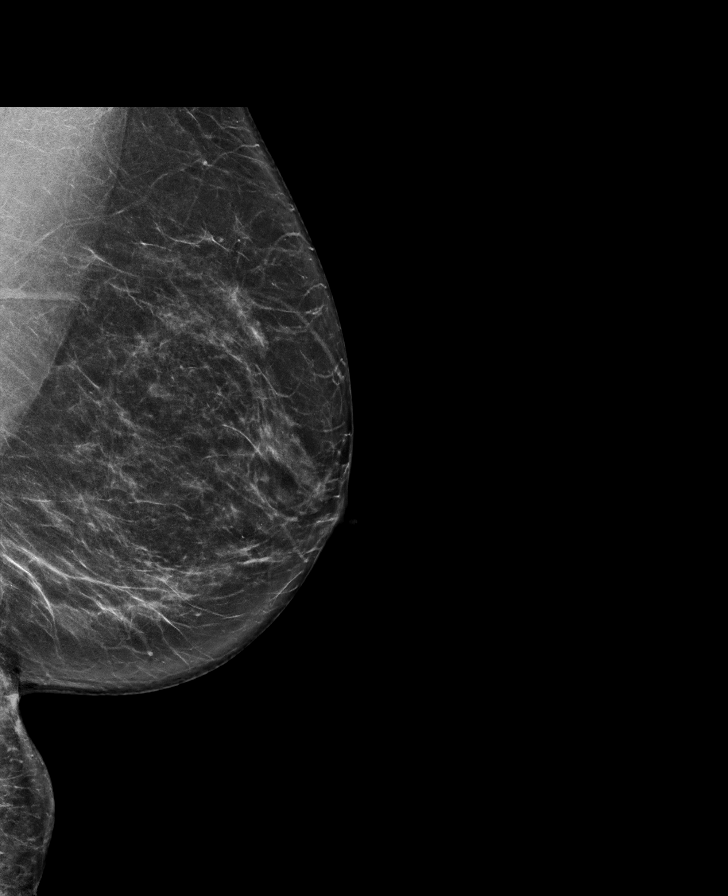

[R MLO synth-2D]
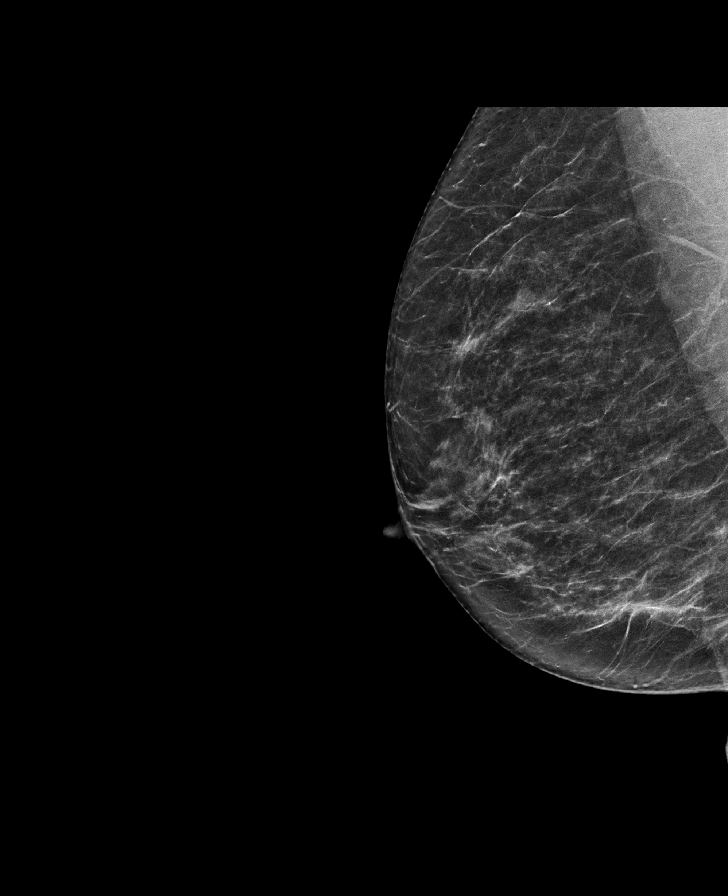

[L CC synth-2D]
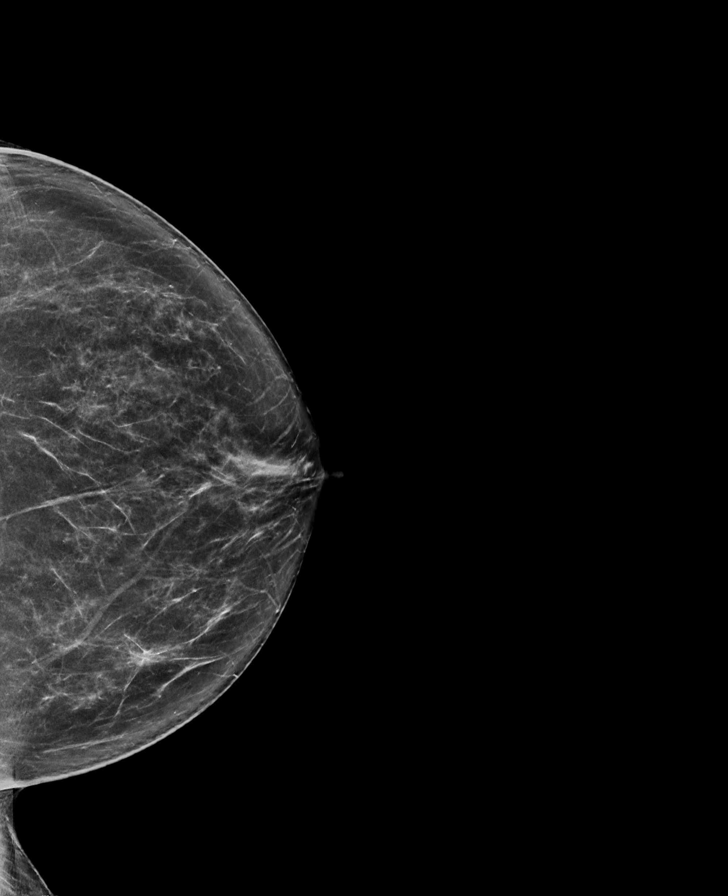

[L MLO tomo · tomo slice 39/77.0]
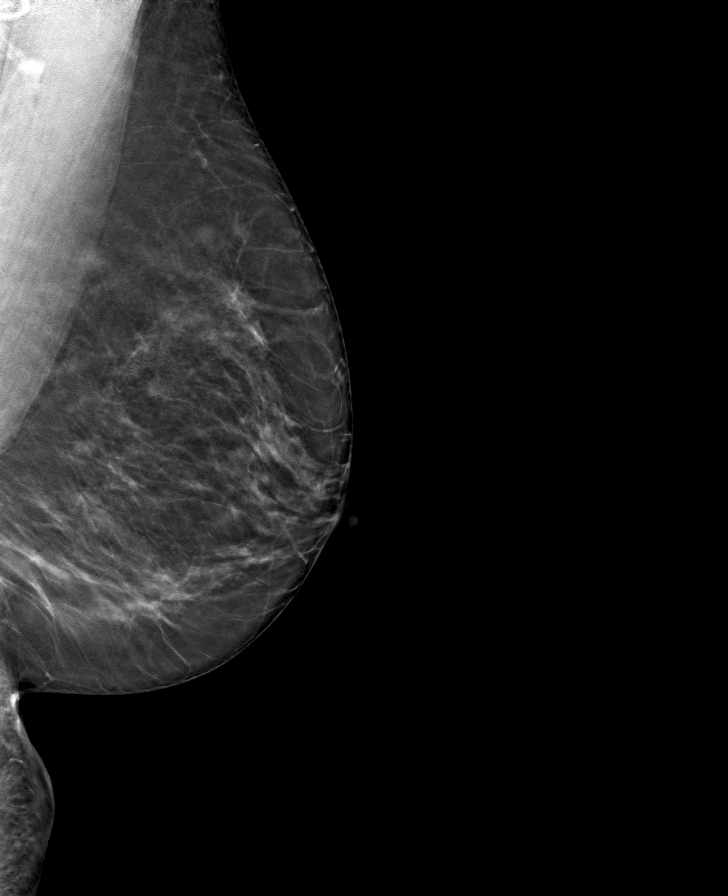

[L CC tomo · tomo slice 39/76.0]
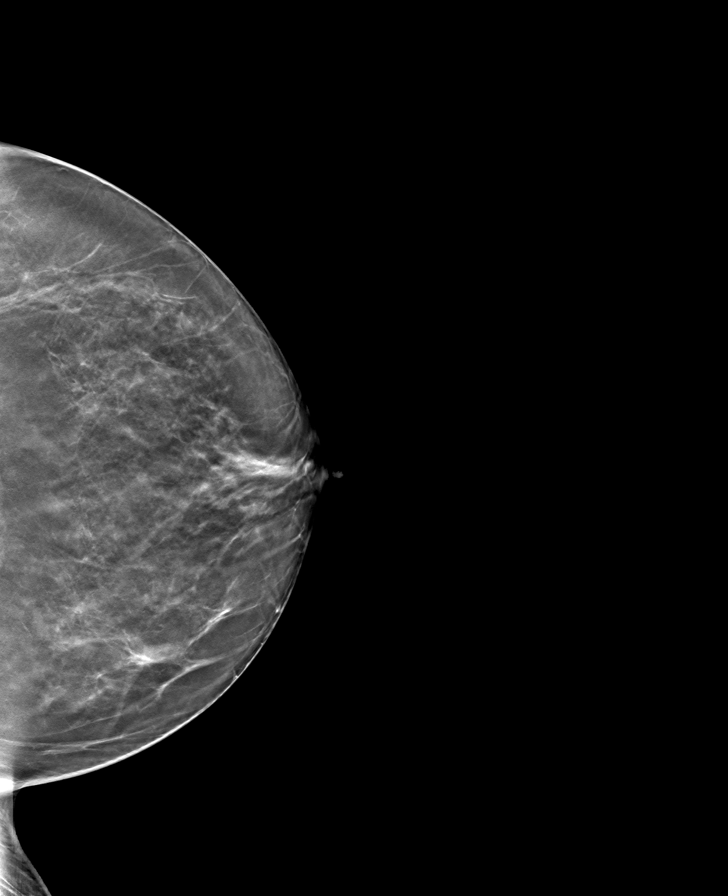

[R CC tomo · tomo slice 38/75.0]
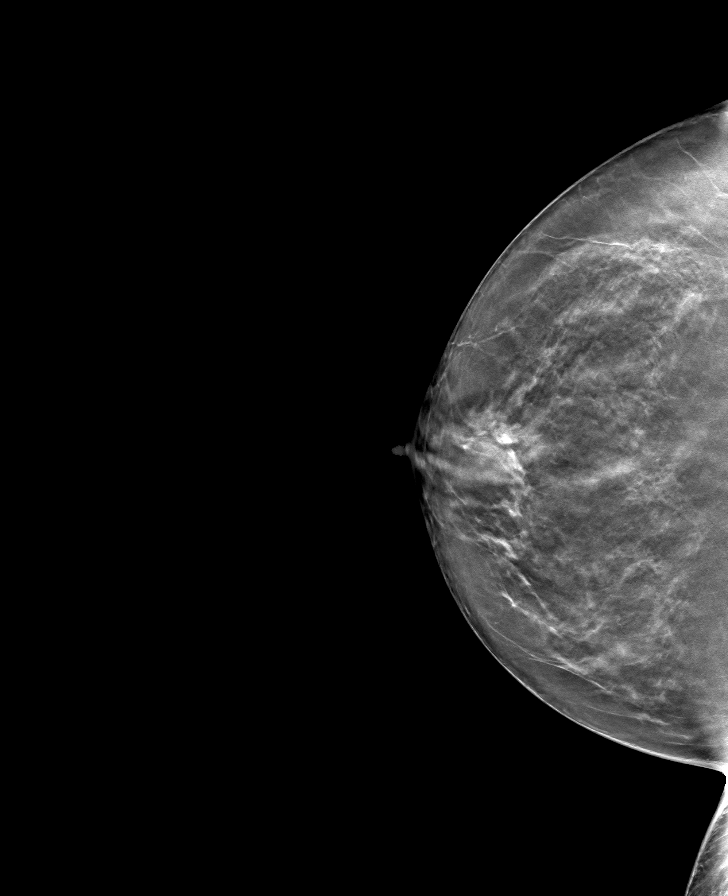

[R MLO tomo · tomo slice 37/73.0]
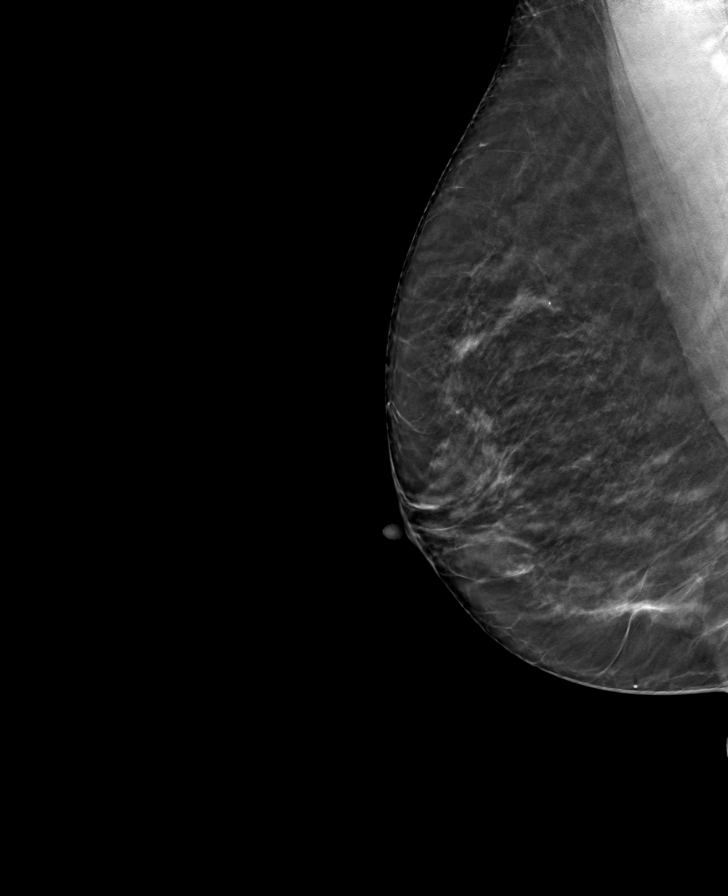

[8 of 24 positions shown; findings below may reference images not displayed]

ACR Breast Density Category b: There are scattered areas of
fibroglandular density.
FINDINGS: There are no findings suspicious for malignancy. Images were
processed with CAD.
IMPRESSION: No mammographic evidence of malignancy. A result letter of this
screening mammogram will be mailed directly to the patient.

RECOMMENDATION:
Screening mammogram in one year. (Code:[id number])

BI-RADS CATEGORY  1: Negative.

## 2020-05-12 ENCOUNTER — Other Ambulatory Visit: Payer: Commercial Managed Care - PPO

## 2020-05-17 DIAGNOSIS — Z0289 Encounter for other administrative examinations: Secondary | ICD-10-CM

## 2020-05-18 ENCOUNTER — Telehealth: Payer: Self-pay | Admitting: *Deleted

## 2020-05-18 ENCOUNTER — Ambulatory Visit
Admission: RE | Admit: 2020-05-18 | Discharge: 2020-05-18 | Disposition: A | Discharge status: Home / Self Care | Payer: Commercial Managed Care - PPO | Source: Ambulatory Visit | Attending: Orthopedic Surgery | Admitting: Orthopedic Surgery

## 2020-05-18 DIAGNOSIS — M25572 Pain in left ankle and joints of left foot: Secondary | ICD-10-CM

## 2020-05-18 NOTE — Telephone Encounter (Signed)
[place] [facility] completed to MM/NP for review signature.

## 2020-05-19 ENCOUNTER — Telehealth: Payer: Self-pay | Admitting: Adult Health

## 2020-05-19 NOTE — Telephone Encounter (Signed)
Pt. Forms faxed to [facility]

## 2020-05-19 NOTE — Telephone Encounter (Signed)
Completed and signed.  To MR.

## 2020-06-05 ENCOUNTER — Other Ambulatory Visit: Payer: Self-pay | Admitting: Adult Health

## 2020-06-06 ENCOUNTER — Telehealth: Payer: Self-pay | Admitting: Adult Health

## 2020-06-06 NOTE — Telephone Encounter (Signed)
Pt called, Pharmacy sent me a message refilled for venlafaxine XR (EFFEXOR-XR) 37.5 MG 24 hr capsule was denied. Suggested I contact the physician. Would like a call from the nurse.

## 2020-06-06 NOTE — Telephone Encounter (Signed)
LVM informing patient the NP refilled venlafaxine in Sept x 6 months. Her pharmacy should have refill on file. Left # for questions.

## 2020-07-11 ENCOUNTER — Telehealth: Payer: Self-pay | Admitting: Adult Health

## 2020-07-11 NOTE — Telephone Encounter (Signed)
I called pt and she is having headaches 3-6 times per week,  Pain r side, nurtec helps but has to take with TOC/ tylenol, excedrin, ibuprofen, always wakes with headache.  No trigger that she can think of.  Has continued issue with broken ankle not able to exercise. occational photosensitivity, nausea. Has not had infusion here, prefers no steroids (causing insomnia).  Please advise.  (on effexor, zonegran, aimovig).

## 2020-07-11 NOTE — Telephone Encounter (Signed)
Pt left a voicemail stating her migraines are increasing. They are 3-6 times a week for the last month.  Pt states she is taking the Rimegepant Sulfate (NURTEC) 75 MG TBDP with something else.  She is asking for a [id number].  Please call pt to discuss migraines.

## 2020-07-11 NOTE — Telephone Encounter (Signed)
Has tried botox when Dr. Janann Colonel was here and she did not think it helped at the time.  She mentions that it is expensive and she does not have that good insurance.  She mentioned that that nurtec was possibility of taking every other day as preventative.  Per MM/NP she stated that could not do both aimovig and nurtec as preventatives.  She could change to emgality and see if this would work.  If a continuous migrainous headache and not getting better could try amerge for 5 days if can take triptans.

## 2020-07-11 NOTE — Telephone Encounter (Signed)
If the patient is having 3-6 headaches a week her preventative treatment needs to change.  Please ensure that she has continued taking Effexor Zonegran and Aimovig.  Next option would be Botox if she has not tried this.

## 2020-07-12 NOTE — Telephone Encounter (Signed)
lmvm returned call.

## 2020-07-12 NOTE — Telephone Encounter (Signed)
Pt returned call.  She is ok to try the emgality.  She stated she has not had migraine today or yesterday.  Did not want to do the amerge due to her myalgias.

## 2020-07-12 NOTE — Telephone Encounter (Signed)
Pt returned call. Please call back when available. [phone]

## 2020-07-12 NOTE — Telephone Encounter (Signed)
I called pt and relayed via VM that cannot take nurtec and aimovig as both preventative. Can change from aimovig to other preventative (injectable).  If you have having one continuous migraine headache amerge is option for taking 5 days (is triptan) you have myalgias listed in allergies.  Let me know.

## 2020-07-13 MED ORDER — EMGALITY 120 MG/ML ~~LOC~~ SOAJ: 120.0000 mg | SUBCUTANEOUS | 5 refills | Status: DC

## 2020-07-13 NOTE — Telephone Encounter (Signed)
Emgality ordered. Stop Aimovig. Take at the due date for her aimovig.

## 2020-07-13 NOTE — Telephone Encounter (Signed)
Pt aware to stop aimovig and start emgality.  Will go online for saving enrollment card.

## 2020-07-13 NOTE — Addendum Note (Signed)
Addended by: Trudie Buckler on: 07/13/2020 04:44 PM   Modules accepted: Orders

## 2020-07-14 ENCOUNTER — Telehealth: Payer: Self-pay | Admitting: Adult Health

## 2020-07-14 NOTE — Telephone Encounter (Signed)
Received a PA request for Emgality 120mg . PA was started on [email or web address]. Key is [id number]. PA was approved 07/14/20-01/12/21. Will fax a copy of the approval to patient's pharmacy.

## 2020-07-26 ENCOUNTER — Other Ambulatory Visit: Payer: Self-pay | Admitting: Adult Health

## 2020-07-27 ENCOUNTER — Telehealth: Payer: Self-pay | Admitting: Adult Health

## 2020-07-27 MED ORDER — NURTEC 75 MG PO TBDP: 75.0000 mg | ORAL_TABLET | Freq: Every day | ORAL | 5 refills | Status: DC | PRN

## 2020-07-27 NOTE — Telephone Encounter (Signed)
Pt is needing a refill on her Rimegepant Sulfate (NURTEC) 75 MG TBDP and zolpidem (AMBIEN CR) 12.5 MG CR tablet [id number]

## 2020-07-27 NOTE — Telephone Encounter (Signed)
She had prescription refill rz , placed both nurtec and ambien in that request.

## 2020-08-09 ENCOUNTER — Ambulatory Visit: Payer: Commercial Managed Care - PPO | Admitting: Adult Health

## 2020-08-25 ENCOUNTER — Other Ambulatory Visit: Payer: Self-pay

## 2020-08-25 ENCOUNTER — Encounter: Payer: Self-pay | Admitting: Adult Health

## 2020-08-25 ENCOUNTER — Ambulatory Visit (INDEPENDENT_AMBULATORY_CARE_PROVIDER_SITE_OTHER): Payer: Commercial Managed Care - PPO | Admitting: Adult Health

## 2020-08-25 VITALS — BP 122/76 | HR 75 | Ht 63.0 in | Wt 138.0 lb

## 2020-08-25 DIAGNOSIS — G43019 Migraine without aura, intractable, without status migrainosus: Secondary | ICD-10-CM

## 2020-08-25 DIAGNOSIS — F5104 Psychophysiologic insomnia: Secondary | ICD-10-CM | POA: Diagnosis not present

## 2020-08-25 NOTE — Progress Notes (Signed)
PATIENT: Vicki Wilson DOB: Jan 04, 1961  REASON FOR VISIT: follow up HISTORY FROM: patient  HISTORY OF PRESENT ILLNESS: Today 08/25/20:  Vicki Wilson is a 60 year old female with a history of migraine headaches and insomnia.  She returns today for follow-up.  She was switched from [employer] to [employer].  She states that she took her first injection last night.  She remains on Effexor.  Continues to have frequent headaches.  She uses Nurtec typically with Tylenol or ibuprofen with good benefit.  She remains on Ambien CR at bedtime.  She reports that this is helpful for her insomnia.  She is always struggled with getting complete control over her insomnia.  Ambien has offered her the best results.  She returns today for an evaluation.  02/02/20: Vicki Wilson is a 60 year old female with a history of migraine headaches and insomnia.  She returns today for follow-up.  She remains on Aimovig, Effexor and Zonegran.  She states that she has approximately 6 headaches a month.  Most recently her migraines have increased due to stress.  Reports that she injured her left ankle at work.  She has been out of work for 8 weeks.  She is no longer taking gabapentin.  Reports that Nurtec combined with Tylenol or ibuprofen does help her headaches but does not always resolve them.  Reports that she did see Dr. Dwyane Dee for sleep.  She is done 1 session of CBT.  She reports that Ambien CR worked better for her but insurance would not pay for this.  She returns today for an evaluation.  HISTORY 07/29/19   Vicki Wilson is a 60 year old female with history of migraines and insomnia. She returns today for follow-up. She continue on Aimovig and Effexor. Zonegran 50 mg daily at the last visit. She reports initially this seemed to really help her headaches but in the last 2-3 months she has begun having 12-16 headaches a month. She reports that she typically takes gabapentin as an abortive therapy. She will use Toradol for severe  headaches. She feels that gabapentin has not been helping as much. She still struggles with sleep. She is on Ambien. Has tried multiple medications in the past. She feels that her lack of good sleep is triggering her migraines. She states that she feels tired all day. She returns today for follow-up.    REVIEW OF SYSTEMS: Out of a complete 14 system review of symptoms, the patient complains only of the following symptoms, and all other reviewed systems are negative.  See HPI  ALLERGIES: Allergies  Allergen Reactions  . Erythromycin     GI upset  . Triptans     myalgias    HOME MEDICATIONS: Outpatient Medications Prior to Visit  Medication Sig Dispense Refill  . acetaminophen (TYLENOL) 500 MG tablet Take 1,000 mg by mouth every 8 (eight) hours as needed.    . cyclobenzaprine (FLEXERIL) 5 MG tablet Take 1 tablet (5 mg total) by mouth at bedtime as needed. 30 tablet 0  . fluticasone (FLONASE) 50 MCG/ACT nasal spray Place 2 sprays into the nose daily as needed.     . Galcanezumab-gnlm (EMGALITY) 120 MG/ML SOAJ Inject 120 mg into the skin every 30 (thirty) days. 1.12 mL 5  . ketorolac (TORADOL) 10 MG tablet Take 1 tablet (10 mg total) by mouth every 6 (six) hours as needed. 10 tablet 0  . Magnesium 500 MG TABS Take by mouth at bedtime.    . Melatonin 10 MG TABS Take  10 tablets by mouth daily. Taking  1 tab QHS    . omeprazole (PRILOSEC) 40 MG capsule Take 40 mg by mouth daily.   11  . promethazine (PHENERGAN) 12.5 MG tablet Take 1 tablet (12.5 mg total) by mouth every 6 (six) hours as needed for nausea or vomiting. 90 tablet 0  . Rimegepant Sulfate (NURTEC) 75 MG TBDP Take 75 mg by mouth daily as needed. Take at the onset of migraine. Only 1 tablet in 24 hour. 10 tablet 5  . triamcinolone cream (KENALOG) 0.1 % Apply 1 application topically 2 (two) times daily as needed.     . venlafaxine XR (EFFEXOR-XR) 37.5 MG 24 hr capsule TAKE 1 CAPSULE BY MOUTH EVERY DAY 90 capsule 1  . zolpidem  (AMBIEN CR) 12.5 MG CR tablet TAKE 1 TABLET (12.5 MG TOTAL) BY MOUTH AT BEDTIME AS NEEDED FOR SLEEP. 30 tablet 5  . zonisamide (ZONEGRAN) 100 MG capsule Take 1 capsule (100 mg total) by mouth daily. 90 capsule 3   No facility-administered medications prior to visit.    PAST MEDICAL HISTORY: Past Medical History:  Diagnosis Date  . Abnormal Pap smear    ASCUS-08/1998  LGSIL/HPV- 01/1999  . Anxiety   . Depression   . GERD (gastroesophageal reflux disease)   . Headache(784.0)   . IBS (irritable bowel syndrome)   . Migraines   . PFO (patent foramen ovale)   . Stroke ([place])   . Uterine fibroids affecting pregnancy     PAST SURGICAL HISTORY: Past Surgical History:  Procedure Laterality Date  . Bilateral Inferior tubinate reduction  1998  . 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 TONSILLECTOMY      FAMILY HISTORY: Family History  Problem Relation Age of Onset  . High blood pressure Mother   . Hyperlipidemia Mother   . Gout Father   . High blood pressure Father   . Diabetes Mellitus I Sister   . Other Sister        seizure disorder  . Alzheimer's disease Maternal Grandmother   . Cancer Paternal Grandmother        colon  . Parkinson's disease Maternal Grandfather   . Kidney failure Other   . Breast cancer Neg Hx     SOCIAL HISTORY: Social History   Socioeconomic History  . Marital status: Single    Spouse name: Not on file  . Number of children: 0  . Years of education: 2 Masters  . Highest education level: Not on file  Occupational History  . Occupation: PHYSICIAN ASSISTANT    Employer: [employer]  Tobacco Use  . Smoking status: Never Smoker  . Smokeless tobacco: Never Used  Substance and Sexual Activity  . Alcohol use: Yes    Comment: rare  . Drug use: No  . Sexual activity: Not Currently    Partners: Male    Birth control/protection: Post-menopausal  Other Topics Concern  . Not on file  Social History  Narrative   Patient is single no children, right handed,    Education level is 2 Master's degree   Caffeine consumption is 0   Social Determinants of Radio broadcast assistant Strain: Not on file  Food Insecurity: Not on file  Transportation Needs: Not on file  Physical Activity: Not on file  Stress: Not on file  Social Connections: Not on file  Intimate Partner Violence: Not on file      PHYSICAL EXAM  Vitals:  08/25/20 0824  BP: 122/76  Pulse: 75  Weight: 138 lb (62.6 kg)  Height: 5\' 3"  (1.6 m)   Body mass index is 24.45 kg/m.  Generalized: Well developed, in no acute distress   Neurological examination  Mentation: Alert oriented to time, place, history taking. Follows all commands speech and language fluent Cranial nerve II-XII: Pupils were equal round reactive to light. Extraocular movements were full, visual field were full on confrontational test. . Head turning and shoulder shrug  were normal and symmetric. Motor: The motor testing reveals 5 over 5 strength of all 4 extremities. Good symmetric motor tone is noted throughout. Brace on left foot. Sensory: Sensory testing is intact to soft touch on all 4 extremities. No evidence of extinction is noted.  Coordination: Cerebellar testing reveals good finger-nose-finger and heel-to-shin bilaterally.     DIAGNOSTIC DATA (LABS, IMAGING, TESTING) - I reviewed patient records, labs, notes, testing and imaging myself where available.  Lab Results  Component Value Date   WBC 4.5 02/18/2014   HGB 12.4 02/18/2014   HCT 36.4 02/18/2014   MCV 91.5 02/18/2014   PLT 221 02/18/2014      Component Value Date/Time   NA 142 02/18/2014 2130   K 3.9 02/18/2014 2130   CL 102 02/18/2014 2130   CO2 26 02/18/2014 2130   GLUCOSE 95 02/18/2014 2130   BUN 25 (H) 02/18/2014 2130   CREATININE 0.80 02/18/2014 2130   CALCIUM 9.8 02/18/2014 2130   PROT 7.1 02/18/2014 2130   ALBUMIN 4.2 02/18/2014 2130   AST 22 02/18/2014 2130    ALT 24 02/18/2014 2130   ALKPHOS 68 02/18/2014 2130   BILITOT <0.2 (L) 02/18/2014 2130   GFRNONAA 85 (L) 02/18/2014 2130   GFRAA >90 02/18/2014 2130   No results found for: CHOL, HDL, LDLCALC, LDLDIRECT, TRIG, CHOLHDL No results found for: HGBA1C No results found for: VITAMINB12 Lab Results  Component Value Date   TSH 1.337 10/22/2013      ASSESSMENT AND PLAN 60 y.o. year old female  has a past medical history of Abnormal Pap smear, Anxiety, Depression, GERD (gastroesophageal reflux disease), Headache(784.0), IBS (irritable bowel syndrome), Migraines, PFO (patent foramen ovale), Stroke ([place]), and Uterine fibroids affecting pregnancy. here with:  Migraine headaches   Continue emgaility and Effexor  Continue Zonegran 100 mg at bedtime  Continue Nurtec as an abortive therapy  Insomnia   Continue Ambien CR 12.5 mg at bedtime    I spent 25 minutes of face-to-face and non-face-to-face time with patient.  This included previsit chart review, lab review, study review, order entry, electronic health record documentation, patient education.  Ward Givens, MSN, NP-C 08/25/2020, 8:47 AM [facility] [address] [place], [place] [address] [phone]

## 2020-08-25 NOTE — Patient Instructions (Signed)
Your Plan:  Continue Emgality and Effexor  Continue Nurtec Continue Ambien      Thank you for coming to see [place] at [facility]. I hope we have been able to provide you high quality care today.  You may receive a patient satisfaction survey over the next few weeks. We would appreciate your feedback and comments so that we may continue to improve ourselves and the health of our patients.

## 2020-09-14 ENCOUNTER — Other Ambulatory Visit: Payer: Self-pay | Admitting: Obstetrics and Gynecology

## 2020-09-14 DIAGNOSIS — Z1231 Encounter for screening mammogram for malignant neoplasm of breast: Secondary | ICD-10-CM

## 2020-10-26 ENCOUNTER — Other Ambulatory Visit: Payer: Self-pay | Admitting: Adult Health

## 2020-10-31 NOTE — Progress Notes (Signed)
60 y.o. G0P0000 Single Black or African American Not Hispanic or Latino female here for annual exam.  No vaginal bleeding. Not sexually active. Occasional vasomotor symptoms, improved.   No bladder c/o. She see's GI for IBS and GERD.     Patient's last menstrual period was 08/28/2013 (exact date).          Sexually active: No.  The current method of family planning is post menopausal status.    Exercising: No.  The patient does not participate in regular exercise at present. Smoker:  no  Health Maintenance: Pap:  09-06-17 neg HPV HR neg History of abnormal Pap:  Yes in 2000, had a colposcopy no treatment needed.  MMG:  10/26/19 density B Bi-rads 1 neg, has one today.   BMD:   2005 heel test  Colonoscopy: 2019 F/U 10 years  TDaP:  2019  Gardasil: NA    reports that she has never smoked. She has never used smokeless tobacco. She reports current alcohol use. She reports that she does not use drugs. Rare ETOH. She is a PA in primary care, health department.   Past Medical History:  Diagnosis Date  . Abnormal Pap smear    ASCUS-08/1998  LGSIL/HPV- 01/1999  . Anxiety   . Depression   . GERD (gastroesophageal reflux disease)   . Headache(784.0)   . IBS (irritable bowel syndrome)   . Migraines   . PFO (patent foramen ovale)   . Stroke ([place])   . Uterine fibroids affecting pregnancy    The stroke was an incidental finding.  Migraines are still an issue, still getting 2-4 migraines a week.   Past Surgical History:  Procedure Laterality Date  . Bilateral Inferior tubinate reduction  1998  . 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 TONSILLECTOMY      Current Outpatient Medications  Medication Sig Dispense Refill  . acetaminophen (TYLENOL) 500 MG tablet Take 1,000 mg by mouth every 8 (eight) hours as needed.    . cyclobenzaprine (FLEXERIL) 5 MG tablet Take 1 tablet (5 mg total) by mouth at bedtime as needed. 30 tablet 0  . fluticasone (FLONASE) 50 MCG/ACT nasal  spray Place 2 sprays into the nose daily as needed.     . Galcanezumab-gnlm (EMGALITY) 120 MG/ML SOAJ Inject 120 mg into the skin every 30 (thirty) days. 1.12 mL 5  . ketorolac (TORADOL) 10 MG tablet Take 1 tablet (10 mg total) by mouth every 6 (six) hours as needed. 10 tablet 0  . Magnesium 500 MG TABS Take by mouth at bedtime.    . Melatonin 10 MG TABS Take 10 tablets by mouth daily. Taking  1 tab QHS    . omeprazole (PRILOSEC) 40 MG capsule Take 40 mg by mouth daily.   11  . Probiotic Product (PROBIOTIC PO) Take by mouth 1 day or 1 dose.    . promethazine (PHENERGAN) 12.5 MG tablet Take 1 tablet (12.5 mg total) by mouth every 6 (six) hours as needed for nausea or vomiting. 90 tablet 0  . Rimegepant Sulfate (NURTEC) 75 MG TBDP Take 75 mg by mouth daily as needed. Take at the onset of migraine. Only 1 tablet in 24 hour. 10 tablet 5  . triamcinolone cream (KENALOG) 0.1 % Apply 1 application topically 2 (two) times daily as needed.     . venlafaxine XR (EFFEXOR-XR) 37.5 MG 24 hr capsule TAKE 1 CAPSULE BY MOUTH EVERY DAY 90 capsule 2  . zolpidem (AMBIEN CR) 12.5  MG CR tablet TAKE 1 TABLET (12.5 MG TOTAL) BY MOUTH AT BEDTIME AS NEEDED FOR SLEEP. 30 tablet 5  . zonisamide (ZONEGRAN) 100 MG capsule Take 1 capsule (100 mg total) by mouth daily. 90 capsule 3  . LINZESS 72 MCG capsule Take 72 mcg by mouth daily.     No current facility-administered medications for this visit.    Family History  Problem Relation Age of Onset  . High blood pressure Mother   . Hyperlipidemia Mother   . Gout Father   . High blood pressure Father   . Diabetes Mellitus I Sister   . Other Sister        seizure disorder  . Alzheimer's disease Maternal Grandmother   . Cancer Paternal Grandmother        colon  . Parkinson's disease Maternal Grandfather   . Kidney failure Other   . Breast cancer Neg Hx     Review of Systems  All other systems reviewed and are negative.   Exam:   BP 130/64   Pulse 66   Ht 5'  3.5" (1.613 m)   Wt 139 lb 9.6 oz (63.3 kg)   LMP 08/28/2013 (Exact Date)   SpO2 99%   BMI 24.34 kg/m   Weight change: @WEIGHTCHANGE @ Height:   Height: 5' 3.5" (161.3 cm)  Ht Readings from Last 3 Encounters:  11/02/20 5' 3.5" (1.613 m)  08/25/20 5\' 3"  (1.6 m)  02/02/20 5' 3.5" (1.613 m)    General appearance: alert, cooperative and appears stated age Head: Normocephalic, without obvious abnormality, atraumatic Neck: no adenopathy, supple, symmetrical, trachea midline and thyroid normal to inspection and palpation Lungs: clear to auscultation bilaterally Cardiovascular: regular rate and rhythm Breasts: normal appearance, no masses or tenderness Abdomen: soft, non-tender; non distended,  no masses,  no organomegaly Extremities: extremities normal, atraumatic, no cyanosis or edema Skin: Skin color, texture, turgor normal. No rashes or lesions Lymph nodes: Cervical, supraclavicular, and axillary nodes normal. No abnormal inguinal nodes palpated Neurologic: Grossly normal   Pelvic: External genitalia:  no lesions              Urethra:  normal appearing urethra with no masses, tenderness or lesions              Bartholins and Skenes: normal                 Vagina: atrophic appearing vagina with normal color and discharge, no lesions              Cervix: no lesions               Bimanual Exam:  Uterus:  normal size, contour, position, consistency, mobility, non-tender              Adnexa: no mass, fullness, tenderness               Rectovaginal: Confirms               Anus:  normal sphincter tone, no lesions  Karmen Bongo chaperoned for the exam.  1. Well woman exam Discussed breast self exam Discussed calcium and vit D intake Labs through work and GI Mammogram today Colonoscopy UTD

## 2020-11-02 ENCOUNTER — Ambulatory Visit (INDEPENDENT_AMBULATORY_CARE_PROVIDER_SITE_OTHER): Payer: Commercial Managed Care - PPO | Admitting: Obstetrics and Gynecology

## 2020-11-02 ENCOUNTER — Other Ambulatory Visit: Payer: Self-pay

## 2020-11-02 ENCOUNTER — Encounter: Payer: Self-pay | Admitting: Obstetrics and Gynecology

## 2020-11-02 ENCOUNTER — Ambulatory Visit
Admission: RE | Admit: 2020-11-02 | Discharge: 2020-11-02 | Disposition: A | Discharge status: Home / Self Care | Payer: Commercial Managed Care - PPO | Source: Ambulatory Visit | Attending: Obstetrics and Gynecology | Admitting: Obstetrics and Gynecology

## 2020-11-02 VITALS — BP 130/64 | HR 66 | Ht 63.5 in | Wt 139.6 lb

## 2020-11-02 DIAGNOSIS — Z1231 Encounter for screening mammogram for malignant neoplasm of breast: Secondary | ICD-10-CM

## 2020-11-02 DIAGNOSIS — Z01419 Encounter for gynecological examination (general) (routine) without abnormal findings: Secondary | ICD-10-CM | POA: Diagnosis not present

## 2020-11-02 NOTE — Patient Instructions (Signed)

## 2021-01-25 ENCOUNTER — Other Ambulatory Visit: Payer: Self-pay | Admitting: Adult Health

## 2021-01-26 NOTE — Telephone Encounter (Signed)
approved

## 2021-02-02 ENCOUNTER — Telehealth: Payer: Self-pay | Admitting: Neurology

## 2021-02-02 NOTE — Telephone Encounter (Signed)
Approved today Request Reference Number: [id number]. EMGALITY INJ 120MG /ML is approved through 02/02/2022. Y.

## 2021-02-02 NOTE — Telephone Encounter (Signed)
PA completed on [facility] rx for the pt FVO:[id number] Will await response

## 2021-02-23 ENCOUNTER — Other Ambulatory Visit: Payer: Self-pay | Admitting: Adult Health

## 2021-02-28 ENCOUNTER — Ambulatory Visit (INDEPENDENT_AMBULATORY_CARE_PROVIDER_SITE_OTHER): Payer: Commercial Managed Care - PPO | Admitting: Adult Health

## 2021-02-28 VITALS — BP 112/79 | HR 91 | Ht 63.5 in | Wt 138.6 lb

## 2021-02-28 DIAGNOSIS — G43009 Migraine without aura, not intractable, without status migrainosus: Secondary | ICD-10-CM | POA: Diagnosis not present

## 2021-02-28 DIAGNOSIS — F5104 Psychophysiologic insomnia: Secondary | ICD-10-CM

## 2021-02-28 MED ORDER — KETOROLAC TROMETHAMINE 10 MG PO TABS: 10.0000 mg | ORAL_TABLET | Freq: Four times a day (QID) | ORAL | 5 refills | Status: DC | PRN

## 2021-02-28 MED ORDER — EMGALITY 120 MG/ML ~~LOC~~ SOAJ: 120.0000 mg | SUBCUTANEOUS | 3 refills | Status: DC

## 2021-02-28 MED ORDER — ZONISAMIDE 100 MG PO CAPS: 100.0000 mg | ORAL_CAPSULE | Freq: Every day | ORAL | 3 refills | Status: DC

## 2021-02-28 NOTE — Patient Instructions (Signed)
Continue emgaility 120 mg monthly injection  Continue Effexor 37.5 mg daily  Continue Zonegran 100 mg at bedtime Continue Nurtec as an abortive therapy

## 2021-02-28 NOTE — Progress Notes (Signed)
PATIENT: Vicki Wilson DOB: [birth year]  REASON FOR VISIT: follow up HISTORY FROM: patient Primary Neurologist: Dr. Brett Fairy  HISTORY OF PRESENT ILLNESS: Today 02/28/21:  Vicki Wilson is a 60 year old female with a history of migraine headaches and insomnia.  She returns today for follow-up.  She is currently on Emgality, Effexor and Zonegran.  She states that she may get 1 headache a week.  She typically can take Nurtec with Tylenol and her headache resolves fairly quickly.  She is currently happy with her treatment plan for her migraines.  She continues to take Ambien extended release 12.5 mg at bedtime.  She returns today for an evaluation.  1/13/22[name] is a 60 year old female with a history of migraine headaches and insomnia.  She returns today for follow-up.  She was switched from [place] to [employer].  She states that she took her first injection last night.  She remains on Effexor.  Continues to have frequent headaches.  She uses Nurtec typically with Tylenol or ibuprofen with good benefit.  She remains on Ambien CR at bedtime.  She reports that this is helpful for her insomnia.  She is always struggled with getting complete control over her insomnia.  Ambien has offered her the best results.  She returns today for an evaluation.  02/02/20: Vicki Wilson is a 60 year old female with a history of migraine headaches and insomnia.  She returns today for follow-up.  She remains on Aimovig, Effexor and Zonegran.  She states that she has approximately 6 headaches a month.  Most recently her migraines have increased due to stress.  Reports that she injured her left ankle at work.  She has been out of work for 8 weeks.  She is no longer taking gabapentin.  Reports that Nurtec combined with Tylenol or ibuprofen does help her headaches but does not always resolve them.  Reports that she did see Dr. Dwyane Dee for sleep.  She is done 1 session of CBT.  She reports that Ambien CR worked better for her but  insurance would not pay for this.  She returns today for an evaluation.  HISTORY 07/29/19    Vicki Wilson is a 60 year old female with history of migraines and insomnia. She returns today for follow-up. She continue on Aimovig and Effexor. Zonegran 50 mg daily at the last visit. She reports initially this seemed to really help her headaches but in the last 2-3 months she has begun having 12-16 headaches a month. She reports that she typically takes gabapentin as an abortive therapy. She will use Toradol for severe headaches. She feels that gabapentin has not been helping as much. She still struggles with sleep. She is on Ambien. Has tried multiple medications in the past. She feels that her lack of good sleep is triggering her migraines. She states that she feels tired all day. She returns today for follow-up.     REVIEW OF SYSTEMS: Out of a complete 14 system review of symptoms, the patient complains only of the following symptoms, and all other reviewed systems are negative.  See HPI  ALLERGIES: Allergies  Allergen Reactions   Erythromycin     GI upset   Triptans     myalgias    HOME MEDICATIONS: Outpatient Medications Prior to Visit  Medication Sig Dispense Refill   acetaminophen (TYLENOL) 500 MG tablet Take 1,000 mg by mouth every 8 (eight) hours as needed.     cyclobenzaprine (FLEXERIL) 5 MG tablet Take 1 tablet (5 mg total) by  mouth at bedtime as needed. 30 tablet 0   fluticasone (FLONASE) 50 MCG/ACT nasal spray Place 2 sprays into the nose daily as needed.      Galcanezumab-gnlm (EMGALITY) 120 MG/ML SOAJ Inject 120 mg into the skin every 30 (thirty) days. 1.12 mL 5   ketorolac (TORADOL) 10 MG tablet Take 1 tablet (10 mg total) by mouth every 6 (six) hours as needed. 10 tablet 0   LINZESS 72 MCG capsule Take 72 mcg by mouth daily.     Magnesium 500 MG TABS Take by mouth at bedtime.     Melatonin 10 MG TABS Take 10 tablets by mouth daily. Taking  1 tab QHS     NURTEC 75 MG TBDP TAKE 1  TABLET BY MOUTH DAILY AS NEEDED. TAKE AT THE ONSET OF MIGRAINE. ONLY 1 TABLET IN 24 HOUR. 8 tablet 7   omeprazole (PRILOSEC) 40 MG capsule Take 40 mg by mouth daily.   11   Probiotic Product (PROBIOTIC PO) Take by mouth 1 day or 1 dose.     promethazine (PHENERGAN) 12.5 MG tablet Take 1 tablet (12.5 mg total) by mouth every 6 (six) hours as needed for nausea or vomiting. 90 tablet 0   triamcinolone cream (KENALOG) 0.1 % Apply 1 application topically 2 (two) times daily as needed.      venlafaxine XR (EFFEXOR-XR) 37.5 MG 24 hr capsule TAKE 1 CAPSULE BY MOUTH EVERY DAY 90 capsule 2   zolpidem (AMBIEN CR) 12.5 MG CR tablet TAKE 1 TABLET BY MOUTH AT BEDTIME AS NEEDED FOR SLEEP. 30 tablet 5   zonisamide (ZONEGRAN) 100 MG capsule Take 1 capsule (100 mg total) by mouth daily. 90 capsule 3   No facility-administered medications prior to visit.    PAST MEDICAL HISTORY: 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   Anxiety    Depression    GERD (gastroesophageal reflux disease)    Headache(784.0)    IBS (irritable bowel syndrome)    Migraines    PFO (patent foramen ovale)    Stroke (HCC)    Uterine fibroids affecting pregnancy     PAST SURGICAL HISTORY: Past Surgical History:  Procedure Laterality Date   Bilateral Inferior tubinate reduction  1998   Epicondylitis Right 05/2009   with ulnar nerve decompression    TONSILLECTOMY      FAMILY HISTORY: Family History  Problem Relation Age of Onset   High blood pressure Mother    Hyperlipidemia Mother    Gout Father    High blood pressure Father    Diabetes Mellitus I Sister    Other Sister        seizure disorder   Alzheimer's disease Maternal Grandmother    Cancer Paternal Grandmother        colon   Parkinson's disease Maternal Grandfather    Kidney failure Other    Breast cancer Neg Hx     SOCIAL HISTORY: Social History   Socioeconomic History   Marital status: Single     Spouse name: Not on file   Number of children: 0   Years of education: 2 Masters   Highest education level: Not on file  Occupational History   Occupation: PHYSICIAN ASSISTANT    Employer: [employer]  Tobacco Use   Smoking status: Never   Smokeless tobacco: Never  Substance and Sexual Activity   Alcohol use: Yes    Comment: rare   Drug  use: No   Sexual activity: Not Currently    Partners: Male    Birth control/protection: Post-menopausal  Other Topics Concern   Not on file  Social History Narrative   Patient is single no children, right handed,    Education level is 2 Master's degree   Caffeine consumption is 0   Social Determinants of Health   Financial Resource Strain: Not on file  Food Insecurity: Not on file  Transportation Needs: Not on file  Physical Activity: Not on file  Stress: Not on file  Social Connections: Not on file  Intimate Partner Violence: Not on file      PHYSICAL EXAM  Vitals:   02/28/21 0838  BP: 112/79  Pulse: 91  Weight: 138 lb 9.6 oz (62.9 kg)  Height: 5' 3.5" (1.613 m)   Body mass index is 24.17 kg/m.  Generalized: Well developed, in no acute distress   Neurological examination  Mentation: Alert oriented to time, place, history taking. Follows all commands speech and language fluent Cranial nerve II-XII: Pupils were equal round reactive to light. Extraocular movements were full, visual field were full on confrontational test. . Head turning and shoulder shrug  were normal and symmetric. Motor: The motor testing reveals 5 over 5 strength of all 4 extremities. Good symmetric motor tone is noted throughout. Brace on left foot. Sensory: Sensory testing is intact to soft touch on all 4 extremities. No evidence of extinction is noted.  Coordination: Cerebellar testing reveals good finger-nose-finger and heel-to-shin bilaterally.     DIAGNOSTIC DATA (LABS, IMAGING, TESTING) - I reviewed patient records, labs,  notes, testing and imaging myself where available.  Lab Results  Component Value Date   WBC 4.5 02/18/2014   HGB 12.4 02/18/2014   HCT 36.4 02/18/2014   MCV 91.5 02/18/2014   PLT 221 02/18/2014      Component Value Date/Time   NA 142 02/18/2014 2130   K 3.9 02/18/2014 2130   CL 102 02/18/2014 2130   CO2 26 02/18/2014 2130   GLUCOSE 95 02/18/2014 2130   BUN 25 (H) 02/18/2014 2130   CREATININE 0.80 02/18/2014 2130   CALCIUM 9.8 02/18/2014 2130   PROT 7.1 02/18/2014 2130   ALBUMIN 4.2 02/18/2014 2130   AST 22 02/18/2014 2130   ALT 24 02/18/2014 2130   ALKPHOS 68 02/18/2014 2130   BILITOT <0.2 (L) 02/18/2014 2130   GFRNONAA 85 (L) 02/18/2014 2130   GFRAA >90 02/18/2014 2130   No results found for: CHOL, HDL, LDLCALC, LDLDIRECT, TRIG, CHOLHDL No results found for: HGBA1C No results found for: VITAMINB12 Lab Results  Component Value Date   TSH 1.337 10/22/2013      ASSESSMENT AND PLAN 60 y.o. year old female  has a past medical history of Abnormal Pap smear, Anxiety, Depression, GERD (gastroesophageal reflux disease), Headache(784.0), IBS (irritable bowel syndrome), Migraines, PFO (patent foramen ovale), Stroke ([place]), and Uterine fibroids affecting pregnancy. here with:  Migraine headaches  Continue emgaility 120 mg monthly injection  Continue Effexor 37.5 mg daily  Continue Zonegran 100 mg at bedtime Continue Nurtec as an abortive therapy  Insomnia  Continue Ambien CR 12.5 mg at bedtime      Ward Givens, MSN, NP-C 02/28/2021, 9:00 AM [facility] [address], [place], [place] [address] [phone]

## 2021-05-09 DIAGNOSIS — Z0289 Encounter for other administrative examinations: Secondary | ICD-10-CM

## 2021-05-18 ENCOUNTER — Telehealth: Payer: Self-pay | Admitting: *Deleted

## 2021-05-18 NOTE — Telephone Encounter (Signed)
Completed FMLA for pt.  To MM/NP for review and signature.

## 2021-05-22 NOTE — Telephone Encounter (Signed)
FMLA signed by NP and sent to medical records for processing.

## 2021-05-23 ENCOUNTER — Telehealth: Payer: Self-pay

## 2021-05-23 ENCOUNTER — Telehealth: Payer: Self-pay | Admitting: *Deleted

## 2021-05-23 NOTE — Telephone Encounter (Signed)
I faxed pt fmla form today to the HR dept.

## 2021-05-23 NOTE — Telephone Encounter (Signed)
I submitted a PA request on CMM, Key: [id number].   Approved. [id number];Review Type:Prior Auth;Coverage Start Date:04/23/2021;Coverage End Date:05/23/2022

## 2021-06-10 ENCOUNTER — Encounter (HOSPITAL_COMMUNITY): Payer: Self-pay | Admitting: Emergency Medicine

## 2021-06-10 ENCOUNTER — Other Ambulatory Visit: Payer: Self-pay

## 2021-06-10 ENCOUNTER — Emergency Department (HOSPITAL_COMMUNITY)
Admission: EM | Admit: 2021-06-10 | Discharge: 2021-06-10 | Disposition: A | Discharge status: Home / Self Care | Payer: Commercial Managed Care - PPO | Attending: Emergency Medicine | Admitting: Emergency Medicine

## 2021-06-10 DIAGNOSIS — R61 Generalized hyperhidrosis: Secondary | ICD-10-CM | POA: Insufficient documentation

## 2021-06-10 DIAGNOSIS — M79604 Pain in right leg: Secondary | ICD-10-CM | POA: Diagnosis not present

## 2021-06-10 DIAGNOSIS — R55 Syncope and collapse: Secondary | ICD-10-CM | POA: Diagnosis not present

## 2021-06-10 DIAGNOSIS — R42 Dizziness and giddiness: Secondary | ICD-10-CM | POA: Insufficient documentation

## 2021-06-10 LAB — CBC WITH DIFFERENTIAL/PLATELET
Abs Immature Granulocytes: 0.01 10*3/uL (ref 0.00–0.07)
Basophils Absolute: 0 10*3/uL (ref 0.0–0.1)
Basophils Relative: 1 %
Eosinophils Absolute: 0.1 10*3/uL (ref 0.0–0.5)
Eosinophils Relative: 2 %
HCT: 34.2 % — ABNORMAL LOW (ref 36.0–46.0)
Hemoglobin: 11.4 g/dL — ABNORMAL LOW (ref 12.0–15.0)
Immature Granulocytes: 0 %
Lymphocytes Relative: 62 %
Lymphs Abs: 3.3 10*3/uL (ref 0.7–4.0)
MCH: 30.2 pg (ref 26.0–34.0)
MCHC: 33.3 g/dL (ref 30.0–36.0)
MCV: 90.7 fL (ref 80.0–100.0)
Monocytes Absolute: 0.4 10*3/uL (ref 0.1–1.0)
Monocytes Relative: 7 %
Neutro Abs: 1.6 10*3/uL — ABNORMAL LOW (ref 1.7–7.7)
Neutrophils Relative %: 28 %
Platelets: 251 10*3/uL (ref 150–400)
RBC: 3.77 MIL/uL — ABNORMAL LOW (ref 3.87–5.11)
RDW: 12.4 % (ref 11.5–15.5)
WBC: 5.5 10*3/uL (ref 4.0–10.5)
nRBC: 0 % (ref 0.0–0.2)

## 2021-06-10 LAB — COMPREHENSIVE METABOLIC PANEL
ALT: 16 U/L (ref 0–44)
AST: 19 U/L (ref 15–41)
Albumin: 3.7 g/dL (ref 3.5–5.0)
Alkaline Phosphatase: 46 U/L (ref 38–126)
Anion gap: 9 (ref 5–15)
BUN: 26 mg/dL — ABNORMAL HIGH (ref 6–20)
CO2: 21 mmol/L — ABNORMAL LOW (ref 22–32)
Calcium: 9 mg/dL (ref 8.9–10.3)
Chloride: 110 mmol/L (ref 98–111)
Creatinine, Ser: 1 mg/dL (ref 0.44–1.00)
GFR, Estimated: >60 mL/min (ref >60)
Glucose, Bld: 167 mg/dL — ABNORMAL HIGH (ref 70–99)
Potassium: 3.3 mmol/L — ABNORMAL LOW (ref 3.5–5.1)
Sodium: 140 mmol/L (ref 135–145)
Total Bilirubin: 0.8 mg/dL (ref 0.3–1.2)
Total Protein: 5.9 g/dL — ABNORMAL LOW (ref 6.5–8.1)

## 2021-06-10 LAB — TROPONIN I (HIGH SENSITIVITY): Troponin I (High Sensitivity): 4 ng/L (ref <18)

## 2021-06-10 LAB — D-DIMER, QUANTITATIVE: D-Dimer, Quant: <0.27 ug/mL-FEU (ref 0.00–0.50)

## 2021-06-10 LAB — CBG MONITORING, ED: Glucose-Capillary: 168 mg/dL — ABNORMAL HIGH (ref 70–99)

## 2021-06-10 LAB — TSH: TSH: 3.228 u[IU]/mL (ref 0.350–4.500)

## 2021-06-10 LAB — CK: Total CK: 163 U/L (ref 38–234)

## 2021-06-10 LAB — I-STAT BETA HCG BLOOD, ED (MC, WL, AP ONLY): I-stat hCG, quantitative: <5 m[IU]/mL (ref <5)

## 2021-06-10 MED ORDER — SODIUM CHLORIDE 0.9 % IV BOLUS
1000.0000 mL | Freq: Once | INTRAVENOUS | Status: AC
  Administered 2021-06-10: 1000 mL via INTRAVENOUS

## 2021-06-10 NOTE — ED Provider Notes (Signed)
  Physical Exam  BP 119/81 (BP Location: Right Arm)   Pulse 70   Temp 97.6 F (36.4 C) (Oral)   Resp 20   Ht 5\' 3"  (1.6 m)   Wt 70 kg   LMP 08/28/2013 (Exact Date)   SpO2 100%   BMI 27.34 kg/m   Physical Exam Vitals and nursing note reviewed.  Constitutional:      General: She is not in acute distress.    Appearance: She is well-developed.  HENT:     Head: Normocephalic and atraumatic.  Eyes:     Conjunctiva/sclera: Conjunctivae normal.  Cardiovascular:     Rate and Rhythm: Normal rate and regular rhythm.     Heart sounds: No murmur heard. Pulmonary:     Effort: Pulmonary effort is normal. No respiratory distress.     Breath sounds: Normal breath sounds.  Abdominal:     Palpations: Abdomen is soft.     Tenderness: There is no abdominal tenderness.  Musculoskeletal:     Cervical back: Neck supple.  Skin:    General: Skin is warm and dry.  Neurological:     Mental Status: She is alert.    ED Course/Procedures   Clinical Course as of 06/10/21 7793  Sat Jun 10, 2021  0703 Syncope, pending fluid challenge and ambulation [MK]    Clinical Course User Index [MK] Mataya Kilduff, MD    Procedures  MDM  Patient received in handoff.  Syncopal work-up.  Pending fluid challenge and ambulation.  Troponin returned is normal.  Patient able to ambulate without difficulty and tolerate p.o. without difficulty.  Patient then discharged with outpatient PCP follow-up.  Previous provider offered the patient syncopal admission for echo and the patient is requesting discharged with outpatient echo.  This is reasonable and the patient was discharged.       Teressa Lower, MD 06/10/21 [phone]

## 2021-06-10 NOTE — ED Provider Notes (Signed)
[facility] EMERGENCY DEPARTMENT Provider Note   CSN: [id number] Arrival date & time: 06/10/21  0453     History Chief Complaint  Patient presents with   Syncope    Vicki Wilson is a 60 y.o. female.  Patient with a history of migraines, depression, anxiety presenting with syncopal episode.  She was visiting her mother upstairs who is a patient.  States she was sleeping and woke up with a cramp in her right leg.  She tried to go to the bathroom and then developed some cramps in her stomach like she needed to have a bowel movement.  Shortly after this she began to feel lightheaded, dizzy and sweaty. The next thing she knows, she was on the ground with nurses surrounding her.  Does not believe that she fell or hit her head.  Denies any headache.  Denies any chest pain or shortness of breath.  Denies abdominal pain, nausea, vomiting or diarrhea.  She has since had 2 regular bowel movements and her abdominal pain has resolved. States she felt like she was going to pass out because she began to feel dizzy, lightheaded and sweaty.  Did not have any chest pain or shortness of breath. Denies any recent illness.  Denies any recent missed meals.  No history of diabetes.  No vomiting or diarrhea.  No fever. States she passed out about 1 year ago and fractured her ankle at the time.  She did not have an echocardiogram or carotid Dopplers.  She followed up with her doctor for lab work. No episodes of syncope since.  Denies any recent medication changes. No visual changes.  Focal weakness, numbness or tingling.  No difficulty speaking or difficulty swallowing. Initial blood pressure on triage was 68/45  The history is provided by the patient.      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   Anxiety    Depression    GERD (gastroesophageal reflux disease)    Headache(784.0)    IBS (irritable bowel syndrome)    Migraines     PFO (patent foramen ovale)    Stroke [facility])    Uterine fibroids affecting pregnancy     Patient Active Problem List   Diagnosis Date Noted   Depressive disorder 09/25/2018   Fibromyalgia 09/25/2018   At high risk for open angle glaucoma of both eyes 12/18/2017   Intractable chronic migraine without aura and with status migrainosus 07/23/2016   Hypermetropia of both eyes 11/15/2015   Keratoconjunctivitis sicca of both eyes not specified as Sjogren's 11/15/2015   Open angle with borderline findings and high glaucoma risk in left eye 11/15/2015   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 infarction and with status migrainosus 12/09/2014   Insomnia due to stress 12/09/2014   Insomnia disorder, with non-sleep disorder mental comorbidity, episodic 08/19/2014   Migraines    Fibroids 11/17/2013   Abnormal CPK 01/08/2013   Dizziness 01/08/2013   Photophobia 01/08/2013   Muscle pain 01/08/2013   Other adrenal hypofunction 01/08/2013   Migraine without aura, without mention of intractable migraine without mention of status migrainosus 01/08/2013   Cerebral thrombosis with cerebral infarction ([place]) 01/08/2013   Ostium secundum type atrial septal defect 01/08/2013   Gait abnormality 12/20/2011   Foot pain 11/21/2011    Past Surgical History:  Procedure Laterality Date   Bilateral Inferior tubinate reduction  1998   Epicondylitis Right 05/2009   with ulnar nerve decompression  TONSILLECTOMY       OB History     Gravida  0   Para  0   Term  0   Preterm  0   AB  0   Living  0      SAB  0   IAB  0   Ectopic  0   Multiple  0   Live Births  0           Family History  Problem Relation Age of Onset   High blood pressure Mother    Hyperlipidemia Mother    Gout Father    High blood pressure Father    Diabetes Mellitus I Sister    Other Sister        seizure disorder   Alzheimer's disease Maternal Grandmother    Cancer Paternal Grandmother         colon   Parkinson's disease Maternal Grandfather    Kidney failure Other    Breast cancer Neg Hx     Social History   Tobacco Use   Smoking status: Never   Smokeless tobacco: Never  Substance Use Topics   Alcohol use: Yes    Comment: rare   Drug use: No    Home Medications Prior to Admission medications   Medication Sig Start Date End Date Taking? Authorizing Provider  acetaminophen (TYLENOL) 500 MG tablet Take 1,000 mg by mouth every 8 (eight) hours as needed.    [provider]  cyclobenzaprine (FLEXERIL) 5 MG tablet Take 1 tablet (5 mg total) by mouth at bedtime as needed. 01/26/19   Ward Givens, NP  fluticasone (FLONASE) 50 MCG/ACT nasal spray Place 2 sprays into the nose daily as needed.     [provider]  Galcanezumab-gnlm (EMGALITY) 120 MG/ML SOAJ Inject 120 mg into the skin every 30 (thirty) days. 02/28/21   Ward Givens, NP  ketorolac (TORADOL) 10 MG tablet Take 1 tablet (10 mg total) by mouth every 6 (six) hours as needed. 02/28/21   Ward Givens, NP  LINZESS 72 MCG capsule Take 72 mcg by mouth daily. 09/10/20   [provider]  Magnesium 500 MG TABS Take by mouth at bedtime.    [provider]  Melatonin 10 MG TABS Take 10 tablets by mouth daily. Taking  1 tab QHS    [provider]  NURTEC 75 MG TBDP TAKE 1 TABLET BY MOUTH DAILY AS NEEDED. TAKE AT THE ONSET OF MIGRAINE. ONLY 1 TABLET IN 24 HOUR. 02/23/21   Ward Givens, NP  omeprazole (PRILOSEC) 40 MG capsule Take 40 mg by mouth daily.  07/09/15   [provider]  Probiotic Product (PROBIOTIC PO) Take by mouth 1 day or 1 dose.    [provider]  promethazine (PHENERGAN) 12.5 MG tablet Take 1 tablet (12.5 mg total) by mouth every 6 (six) hours as needed for nausea or vomiting. 07/23/16   Dohmeier, Asencion Partridge, MD  triamcinolone cream (KENALOG) 0.1 % Apply 1 application topically 2 (two) times daily as needed.  10/23/13   [provider]   venlafaxine XR (EFFEXOR-XR) 37.5 MG 24 hr capsule TAKE 1 CAPSULE BY MOUTH EVERY DAY 10/27/20   Ward Givens, NP  zolpidem (AMBIEN CR) 12.5 MG CR tablet TAKE 1 TABLET BY MOUTH AT BEDTIME AS NEEDED FOR SLEEP. 01/26/21   Ward Givens, NP  zonisamide (ZONEGRAN) 100 MG capsule Take 1 capsule (100 mg total) by mouth daily. 02/28/21   Ward Givens, NP    Allergies  Erythromycin and Triptans  Review of Systems   Review of Systems  Constitutional:  Positive for fatigue. Negative for activity change, appetite change and fever.  HENT:  Negative for congestion and rhinorrhea.   Respiratory:  Negative for cough, chest tightness and shortness of breath.   Cardiovascular:  Negative for chest pain and leg swelling.  Gastrointestinal:  Negative for abdominal pain, nausea and vomiting.  Genitourinary:  Negative for dysuria and hematuria.  Musculoskeletal:  Negative for arthralgias and myalgias.  Skin:  Negative for rash.  Neurological:  Positive for dizziness, syncope, weakness and light-headedness. Negative for headaches.   all other systems are negative except as noted in the HPI and PMH.   Physical Exam Updated Vital Signs BP 111/73   Pulse 65   Temp 98.9 F (37.2 C) (Oral)   Resp 19   Ht 5\' 3"  (1.6 m)   Wt 70 kg   LMP 08/28/2013 (Exact Date)   SpO2 99%   BMI 27.34 kg/m   Physical Exam Vitals and nursing note reviewed.  Constitutional:      General: She is not in acute distress.    Appearance: She is well-developed.  HENT:     Head: Normocephalic and atraumatic.     Mouth/Throat:     Pharynx: No oropharyngeal exudate.  Eyes:     Conjunctiva/sclera: Conjunctivae normal.     Pupils: Pupils are equal, round, and reactive to light.  Neck:     Comments: No meningismus. Cardiovascular:     Rate and Rhythm: Normal rate and regular rhythm.     Heart sounds: Normal heart sounds. No murmur heard. Pulmonary:     Effort: Pulmonary effort is normal. No respiratory distress.      Breath sounds: Normal breath sounds.  Abdominal:     Palpations: Abdomen is soft.     Tenderness: There is no abdominal tenderness. There is no guarding or rebound.  Musculoskeletal:        General: No tenderness. Normal range of motion.     Cervical back: Normal range of motion and neck supple.  Skin:    General: Skin is warm.  Neurological:     Mental Status: She is alert and oriented to person, place, and time.     Cranial Nerves: No cranial nerve deficit.     Motor: No abnormal muscle tone.     Coordination: Coordination normal.     Comments:  5/5 strength throughout. CN 2-12 intact.Equal grip strength.   Psychiatric:        Behavior: Behavior normal.    ED Results / Procedures / Treatments   Labs (all labs ordered are listed, but only abnormal results are displayed) Labs Reviewed  CBC WITH DIFFERENTIAL/PLATELET - Abnormal; Notable for the following components:      Result Value   RBC 3.77 (*)    Hemoglobin 11.4 (*)    HCT 34.2 (*)    Neutro Abs 1.6 (*)    All other components within normal limits  COMPREHENSIVE METABOLIC PANEL - Abnormal; Notable for the following components:   Potassium 3.3 (*)    CO2 21 (*)    Glucose, Bld 167 (*)    BUN 26 (*)    Total Protein 5.9 (*)    All other components within normal limits  CBG MONITORING, ED - Abnormal; Notable for the following components:   Glucose-Capillary 168 (*)    All other components within normal limits  D-DIMER, QUANTITATIVE  TSH  CK  I-STAT BETA HCG  BLOOD, ED ([facility], [facility], [facility] ONLY)  TROPONIN I (HIGH SENSITIVITY)  TROPONIN I (HIGH SENSITIVITY)    EKG EKG Interpretation  Date/Time:  Saturday June 10 2021 05:11:42 EDT Ventricular Rate:  59 PR Interval:  198 QRS Duration: 68 QT Interval:  484 QTC Calculation: 479 R Axis:   -39 Text Interpretation: Sinus bradycardia Left axis deviation Anterolateral infarct , age undetermined Abnormal ECG No significant change was found Confirmed by Ezequiel Essex [phone])  on 06/10/2021 5:32:11 AM  Radiology No results found.  Procedures Procedures   Medications Ordered in ED Medications  sodium chloride 0.9 % bolus 1,000 mL (has no administration in time range)    ED Course  I have reviewed the triage vital signs and the nursing notes.  Pertinent labs & imaging results that were available during my care of the patient were reviewed by me and considered in my medical decision making (see chart for details).  Clinical Course as of 06/10/21 2035  Sat Jun 10, 2021  0703 Syncope, pending fluid challenge and ambulation [MK]    Clinical Course User Index [MK] Kommor, [place], MD   MDM Rules/Calculators/A&P                          Syncopal episode with preceding leg cramp, abdominal cramps, sweating and lightheadedness.  No head injury.  No chest pain or shortness of breath.  Initially hypotensive but improved to 111/73 on recheck. No recent black or bloody stools.  EKG is sinus rhythm.  No Brugada, no prolonged QT  Labs show stable hemoglobin and creatinine.  IV fluids given.  Orthostatics are negative.  Suspect likely vasovagal syncope.  Patient did have prodrome of dizziness, lightheadedness, diaphoresis.  No chest pain or shortness of breath.  Troponin and D-dimer are negative.  Observation admission offered and discussed with patient.  She declines and wants to go home.  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  Patient appears well.  She is tolerating p.o. and ambulatory.  She has not [facility] syncope criteria negative due to initial hypotension.  Unclear validity of her initial hypotension in the 60s as blood pressure was normal 30 minutes later.  She is not orthostatic.  She is tolerating p.o. and ambulatory.  She denies any chest pain, dizziness, lightheadedness, shortness of breath.  She declines admission to the hospital and wishes to go home for PCP follow-up.  P.o. challenge and  ambulation trial pending at shift change. Dr. Matilde Sprang to assume care.  Final Clinical Impression(s) / ED Diagnoses Final diagnoses:  None    Rx / DC Orders ED Discharge Orders     None        Chandelle Harkey, Annie Main, MD 06/10/21 [phone]

## 2021-06-10 NOTE — Discharge Instructions (Signed)
Your testing was reassuring.  Keep yourself hydrated.  You declined admission to the hospital today.  You should follow-up with your doctor for an echocardiogram as well as ultrasound of your carotid arteries. Return to the ED with chest pain, shortness of breath, recurrent dizziness or any other concerns

## 2021-06-10 NOTE — ED Triage Notes (Signed)
Patient passed out twice while visiting a patient this morning , hypotensive and pale at triage .

## 2021-06-26 ENCOUNTER — Other Ambulatory Visit: Payer: Self-pay | Admitting: Adult Health

## 2021-06-28 NOTE — Progress Notes (Addendum)
Cardiology Office Note:    Date:  06/28/2021   ID:  Vicki Wilson, DOB March 30, 1961, MRN [id number]  PCP:  Shirline Frees, MD   [facility] Providers Cardiologist:  None     Referring MD: Almedia Balls, NP   No chief complaint on file. Syncope   History of Present Illness:    Vicki Wilson is a 60 y.o. female with a hx of migraines, PFO, IBS, anxiety, depression,referral after an ED visit for syncopal episode with a prodrome  Her mother had surgery and she was staying with her. She had a cramp in her leg. On the way to the rest room. She got abdominal pain. She planned to use the restroom for a BM. She was diaphoretic. She held on to the handle and passed out. She did not know she was going to pass out. She notes some palpitations and chest pressure afterwards. She was given fluids.   She had a similar episode before 18 months ago,  she was diaphoretic. She does not remember anything else. She fractured her ankle.   She had an echo before. The indication was for migraines. The bubble study was positive. She then got a TEE. 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 The PFO was too small to close. This was done at [facility] 2007-[facility]. Dr. Nadyne Coombes.   No family hx of heart disease. No rhythm hx. No SCD. No stroke symptoms. She is otherwise healthy. She's a PA. She is on a bowel regimen with IBS.   Crt 1.0 Hgb 11.4 TSH 3.2  Cardiology Studies: 06/12/2021-EKG Sinus bradycardia HR 59 bpm, poor anterior r wave progression  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   Anxiety    Depression    GERD (gastroesophageal reflux disease)    Headache(784.0)    IBS (irritable bowel syndrome)    Migraines    PFO (patent foramen ovale)    Stroke [facility])    Uterine fibroids affecting pregnancy     Past Surgical History:  Procedure Laterality Date   Bilateral Inferior tubinate reduction  1998   Epicondylitis Right 05/2009   with ulnar  nerve decompression    TONSILLECTOMY      Current Medications: No outpatient medications have been marked as taking for the 06/29/21 encounter (Appointment) with Janina Mayo, MD.     Allergies:   Erythromycin and Triptans   Social History   Socioeconomic History   Marital status: Single    Spouse name: Not on file   Number of children: 0   Years of education: 2 Masters   Highest education level: Not on file  Occupational History   Occupation: PHYSICIAN ASSISTANT    Employer: [employer]  Tobacco Use   Smoking status: Never   Smokeless tobacco: Never  Substance and Sexual Activity   Alcohol use: Yes    Comment: rare   Drug use: No   Sexual activity: Not Currently    Partners: Male    Birth control/protection: Post-menopausal  Other Topics Concern   Not on file  Social History Narrative   Patient is single no children, right handed,    Education level is 2 Master's degree   Caffeine consumption is 0   Social Determinants of Health   Financial Resource Strain: Not on file  Food Insecurity: Not on file  Transportation Needs: Not on file  Physical Activity: Not on file  Stress:  Not on file  Social Connections: Not on file     Family History: The patient's family history includes Alzheimer's disease in her maternal grandmother; Cancer in her paternal grandmother; Diabetes Mellitus I in her sister; Gout in her father; High blood pressure in her father and mother; Hyperlipidemia in her mother; Kidney failure in an other family member; Other in her sister; Parkinson's disease in her maternal grandfather. There is no history of Breast cancer.  ROS:   Please see the history of present illness.     All other systems reviewed and are negative.  EKGs/Labs/Other Studies Reviewed:    The following studies were reviewed today:   EKG:  EKG is  ordered today.  The ekg ordered today demonstrates   NSR, LAD, poor r wave progression  Recent  Labs: 06/10/2021: ALT 16; BUN 26; Creatinine, Ser 1.00; Hemoglobin 11.4; Platelets 251; Potassium 3.3; Sodium 140; TSH 3.228  Recent Lipid Panel No results found for: CHOL, TRIG, HDL, CHOLHDL, VLDL, LDLCALC, LDLDIRECT   Risk Assessment/Calculations:           Physical Exam:    VS:    Vitals:   06/29/21 0750  BP: 130/90  Pulse: 75  SpO2: 99%     Wt Readings from Last 3 Encounters:  06/10/21 154 lb 5.2 oz (70 kg)  02/28/21 138 lb 9.6 oz (62.9 kg)  11/02/20 139 lb 9.6 oz (63.3 kg)     GEN:  Well nourished, well developed in no acute distress HEENT: Normal NECK: No JVD;  LYMPHATICS: No lymphadenopathy CARDIAC: RRR, no murmurs, rubs, gallops RESPIRATORY:  Clear to auscultation without rales, wheezing or rhonchi  ABDOMEN: Soft, non-tender, non-distended MUSCULOSKELETAL:  No edema; No deformity  SKIN: Warm and dry NEUROLOGIC:  Alert and oriented x 3 PSYCHIATRIC:  Normal affect   ASSESSMENT:    #Syncope: Symptoms correlate with vasovagal syncope. Her prodrome is not classic. We discussed this possible diagnose vs. further work up. She opted for further work up. He has history of PFO, can re-assess this as well. -TTE with bubble -Cardiac event monitor for 2 weeks   PLAN:    In order of problems listed above:  Per above Follow up in 3 months     Medication Adjustments/Labs and Tests Ordered: Current medicines are reviewed at length with the patient today.  Concerns regarding medicines are outlined above.    Signed, Janina Mayo, MD  06/28/2021 7:10 PM    [facility]

## 2021-06-29 ENCOUNTER — Encounter: Payer: Self-pay | Admitting: *Deleted

## 2021-06-29 ENCOUNTER — Ambulatory Visit (INDEPENDENT_AMBULATORY_CARE_PROVIDER_SITE_OTHER): Payer: Commercial Managed Care - PPO | Admitting: Internal Medicine

## 2021-06-29 ENCOUNTER — Encounter: Payer: Self-pay | Admitting: Internal Medicine

## 2021-06-29 ENCOUNTER — Other Ambulatory Visit: Payer: Self-pay

## 2021-06-29 VITALS — BP 130/90 | HR 75 | Ht 63.5 in | Wt 136.8 lb

## 2021-06-29 DIAGNOSIS — R55 Syncope and collapse: Secondary | ICD-10-CM

## 2021-06-29 NOTE — Progress Notes (Signed)
Patient ID: Vicki Wilson, female   DOB: 1961-07-28, 60 y.o.   MRN: [id number] Patient enrolled for [employer] to ship a 14 day cardiac event monitor to her address on file.

## 2021-06-29 NOTE — Patient Instructions (Signed)
Medication Instructions:  No Changes In Medications at this time.  *If you need a refill on your cardiac medications before your next appointment, please call your pharmacy*  Testing/Procedures: Your physician has requested that you have an echocardiogram with Bubble Study. Echocardiography is a painless test that uses sound waves to create images of your heart. It provides your doctor with information about the size and shape of your heart and how well your heart's chambers and valves are working. This procedure takes approximately one hour. There are no restrictions for this procedure. This will take place at [address]  [employer] Cardiac Event Monitor Instructions Your physician has requested you wear your cardiac event monitor for 14 days, (1-30). [employer] may call or text to confirm a shipping address. The monitor will be sent to a land address via UPS. [employer] will not ship a monitor to a PO BOX. It typically takes 3-5 days to receive your monitor after it has been enrolled. [employer] will assist with USPS tracking if your package is delayed. The telephone number for [employer] is [phone]. Once you have received your monitor, please review the enclosed instructions. Instruction tutorials can also be viewed under help and settings on the enclosed cell phone. Your monitor has already been registered assigning a specific monitor serial # to you.  Applying the monitor Remove cell phone from case and turn it on. The cell phone works as Dealer and needs to be within [employer] of you at all times. The cell phone will need to be charged on a daily basis. We recommend you plug the cell phone into the enclosed charger at your bedside table every night.  Monitor batteries: You will receive two monitor batteries labelled #1 and #2. These are your recorders. Plug battery #2 onto the second connection on the enclosed charger. Keep one battery on the charger at  all times. This will keep the monitor battery deactivated. It will also keep it fully charged for when you need to switch your monitor batteries. A small light will be blinking on the battery emblem when it is charging. The light on the battery emblem will remain on when the battery is fully charged.  Open package of a Monitor strip. Insert battery #1 into black hood on strip and gently squeeze monitor battery onto connection as indicated in instruction booklet. Set aside while preparing skin.  Choose location for your strip, vertical or horizontal, as indicated in the instruction booklet. Shave to remove all hair from location. There cannot be any lotions, oils, powders, or colognes on skin where monitor is to be applied. Wipe skin clean with enclosed Saline wipe. Dry skin completely.  Peel paper labeled #1 off the back of the Monitor strip exposing the adhesive. Place the monitor on the chest in the vertical or horizontal position shown in the instruction booklet. One arrow on the monitor strip must be pointing upward. Carefully remove paper labeled #2, attaching remainder of strip to your skin. Try not to create any folds or wrinkles in the strip as you apply it.  Firmly press and release the circle in the center of the monitor battery. You will hear a small beep. This is turning the monitor battery on. The heart emblem on the monitor battery will light up every 5 seconds if the monitor battery in turned on and connected to the patient securely. Do not push and hold the circle down as this turns the monitor battery off. The cell phone  will locate the monitor battery. A screen will appear on the cell phone checking the connection of your monitor strip. This may read poor connection initially but change to good connection within the next minute. Once your monitor accepts the connection you will hear a series of 3 beeps followed by a climbing crescendo of beeps. A screen will appear on the  cell phone showing the two monitor strip placement options. Touch the picture that demonstrates where you applied the monitor strip.  Your monitor strip and battery are waterproof. You are able to shower, bathe, or swim with the monitor on. They just ask you do not submerge deeper than 3 feet underwater. We recommend removing the monitor if you are swimming in a lake, river, or ocean.  Your monitor battery will need to be switched to a fully charged monitor battery approximately once a week. The cell phone will alert you of an action which needs to be made.  On the cell phone, tap for details to reveal connection status, monitor battery status, and cell phone battery status. The green dots indicates your monitor is in good status. A red dot indicates there is something that needs your attention.  To record a symptom, click the circle on the monitor battery. In 30-60 seconds a list of symptoms will appear on the cell phone. Select your symptom and tap save. Your monitor will record a sustained or significant arrhythmia regardless of you clicking the button. Some patients do not feel the heart rhythm irregularities. [employer] will notify us of any serious or critical events.  Refer to instruction booklet for instructions on switching batteries, changing strips, the Do not disturb or Pause features, or any additional questions.  Call [employer] at [phone], to confirm your monitor is transmitting and record your baseline. They will answer any questions you may have regarding the monitor instructions at that time.  Returning the monitor to [place] all equipment back into blue box. Peel off strip of paper to expose adhesive and close box securely. There is a prepaid UPS shipping label on this box. Drop in a UPS drop box, or at a UPS facility like [employer]. You may also contact [employer] to arrange UPS to pick up monitor package at your home.  Follow-Up: At [facility],  you and your health needs are our priority.  As part of our continuing mission to provide you with exceptional heart care, we have created designated Provider Care Teams.  These Care Teams include your primary Cardiologist (physician) and Advanced Practice Providers (APPs -  Physician Assistants and Nurse Practitioners) who all work together to provide you with the care you need, when you need it.  We recommend signing up for the patient portal called "MyChart".  Sign up information is provided on this After Visit Summary.  MyChart is used to connect with patients for Virtual Visits (Telemedicine).  Patients are able to view lab/test results, encounter notes, upcoming appointments, etc.  Non-urgent messages can be sent to your provider as well.   To learn more about what you can do with MyChart, go to [email or web address].    Your next appointment:   3 month(s)  The format for your next appointment:   In Person  Provider:   Janina Mayo, MD

## 2021-07-08 ENCOUNTER — Ambulatory Visit (INDEPENDENT_AMBULATORY_CARE_PROVIDER_SITE_OTHER): Payer: Commercial Managed Care - PPO

## 2021-07-08 DIAGNOSIS — R55 Syncope and collapse: Secondary | ICD-10-CM

## 2021-07-17 ENCOUNTER — Ambulatory Visit (HOSPITAL_COMMUNITY): Payer: Commercial Managed Care - PPO | Attending: Cardiovascular Disease

## 2021-07-17 ENCOUNTER — Other Ambulatory Visit: Payer: Self-pay

## 2021-07-17 DIAGNOSIS — R55 Syncope and collapse: Secondary | ICD-10-CM | POA: Diagnosis not present

## 2021-07-17 LAB — ECHOCARDIOGRAM COMPLETE BUBBLE STUDY
Area-P 1/2: 3.53 cm2
S' Lateral: 2.8 cm

## 2021-07-17 MED ORDER — SODIUM CHLORIDE 0.9% FLUSH
10.0000 mL | INTRAVENOUS | Status: AC | PRN
  Administered 2021-07-17: 10 mL via INTRAVENOUS

## 2021-07-18 ENCOUNTER — Telehealth: Payer: Self-pay | Admitting: Internal Medicine

## 2021-07-18 NOTE — Telephone Encounter (Signed)
Called pt, no answer. Left detailed message on her machine per her request.

## 2021-07-18 NOTE — Telephone Encounter (Signed)
  Pt is returning call to get echo result, pt said she will be seeing pt all day today and if she unable to answer her phone to leave her a detailed message

## 2021-07-18 NOTE — Telephone Encounter (Signed)
Per Delana Meyer, RN results left on patients VM okay per DPR and at request of patient. Will also mail patient copy of results.

## 2021-07-23 ENCOUNTER — Other Ambulatory Visit: Payer: Self-pay | Admitting: Adult Health

## 2021-07-23 DIAGNOSIS — F5104 Psychophysiologic insomnia: Secondary | ICD-10-CM

## 2021-07-25 ENCOUNTER — Other Ambulatory Visit (HOSPITAL_COMMUNITY): Payer: Commercial Managed Care - PPO

## 2021-07-26 ENCOUNTER — Encounter: Payer: Self-pay | Admitting: *Deleted

## 2021-08-29 ENCOUNTER — Telehealth (INDEPENDENT_AMBULATORY_CARE_PROVIDER_SITE_OTHER): Payer: Commercial Managed Care - PPO | Admitting: Adult Health

## 2021-08-29 DIAGNOSIS — M62838 Other muscle spasm: Secondary | ICD-10-CM

## 2021-08-29 DIAGNOSIS — G43009 Migraine without aura, not intractable, without status migrainosus: Secondary | ICD-10-CM | POA: Diagnosis not present

## 2021-08-29 DIAGNOSIS — F5104 Psychophysiologic insomnia: Secondary | ICD-10-CM

## 2021-08-29 NOTE — Progress Notes (Signed)
PATIENT: Vicki Wilson DOB: [birth year]  REASON FOR VISIT: follow up HISTORY FROM: patient  Virtual Visit via Video Note  I connected with Vicki Wilson on 08/29/21 at  8:30 AM EST by a video enabled telemedicine application located remotely at [facility] and verified that I am speaking with the correct person using two identifiers who was located at their own home.   I discussed the limitations of evaluation and management by telemedicine and the availability of in person appointments. The patient expressed understanding and agreed to proceed.   PATIENT: Vicki Wilson DOB: 1960-10-10  REASON FOR VISIT: follow up HISTORY FROM: patient  HISTORY OF PRESENT ILLNESS: Today 08/29/21:  Vicki Wilson is a 61 year old female with a history of migraine headaches and insomnia.  She returns today for follow-up.  She reports that [employer], Zonegran and Effexor continues to work okay for her migraines.  She states he recently she did have an ongoing headache that kept her out of work.  She typically takes Nurtec but usually has to pair it with Tylenol or ibuprofen.  On occasion she will use magnesium as well.  She continues to use Ambien CR 12.5 mg for insomnia.  She is asking about the new medication Quviviq.  She plans to check with her insurance to see if it is approved.  Patient has a prescription of Flexeril that she uses for muscle spasms.  She states that she uses it very rarely.  Often uses it for neck pain.    REVIEW OF SYSTEMS: Out of a complete 14 system review of symptoms, the patient complains only of the following symptoms, and all other reviewed systems are negative.  ALLERGIES: Allergies  Allergen Reactions   Erythromycin     GI upset   Triptans     myalgias    HOME MEDICATIONS: Outpatient Medications Prior to Visit  Medication Sig Dispense Refill   zolpidem (AMBIEN CR) 12.5 MG CR tablet TAKE 1 TABLET BY MOUTH EVERY DAY AT BEDTIME AS NEEDED FOR  SLEEP 30 tablet 5   acetaminophen (TYLENOL) 500 MG tablet Take 1,000 mg by mouth every 8 (eight) hours as needed for moderate pain or headache.     cetirizine (ZYRTEC) 10 MG tablet Take 10 mg by mouth daily as needed for allergies.     Cholecalciferol (VITAMIN D3) 50 MCG (2000 UT) TABS Take 2,000 Units by mouth daily.     clobetasol ointment (TEMOVATE) 0.05 % Apply a thin layer on scalp (vertex) at night time 3 times / week     cyclobenzaprine (FLEXERIL) 5 MG tablet Take 1 tablet (5 mg total) by mouth at bedtime as needed. (Patient taking differently: Take 2.5-5 mg by mouth at bedtime as needed for muscle spasms.) 30 tablet 0   doxycycline (VIBRAMYCIN) 100 MG capsule Take 100 mg by mouth See admin instructions. Qd x 6 weeks     finasteride (PROSCAR) 5 MG tablet Take 2.5 mg by mouth daily.     Fluocinolone Acetonide Scalp 0.01 % OIL Apply 1 application topically See admin instructions. 2-3 times per week     fluticasone (FLONASE) 50 MCG/ACT nasal spray Place 2 sprays into the nose daily as needed for allergies.     Galcanezumab-gnlm (EMGALITY) 120 MG/ML SOAJ Inject 120 mg into the skin every 30 (thirty) days. 3.36 mL 3   ketorolac (TORADOL) 10 MG tablet Take 1 tablet (10 mg total) by mouth every 6 (six) hours as needed. (Patient taking differently: Take  10 mg by mouth every 6 (six) hours as needed (migraine).) 10 tablet 5   LINZESS 72 MCG capsule Take 72 mcg by mouth daily.     Melatonin 10 MG TABS Take 10 mg by mouth at bedtime.     Minoxidil 5 % FOAM Apply topically.     NURTEC 75 MG TBDP TAKE 1 TABLET BY MOUTH DAILY AS NEEDED. TAKE AT THE ONSET OF MIGRAINE. ONLY 1 TABLET IN 24 HOUR. (Patient taking differently: Take 75 mg by mouth as needed (migraine).) 8 tablet 7   omeprazole (PRILOSEC) 40 MG capsule Take 40 mg by mouth daily.   11   Probiotic Product (PROBIOTIC PO) Take 1 capsule by mouth at bedtime.     promethazine (PHENERGAN) 12.5 MG tablet Take 1 tablet (12.5 mg total) by mouth every 6  (six) hours as needed for nausea or vomiting. 90 tablet 0   triamcinolone cream (KENALOG) 0.1 % Apply 1 application topically 2 (two) times daily as needed (itching/rash).     venlafaxine XR (EFFEXOR-XR) 37.5 MG 24 hr capsule TAKE 1 CAPSULE BY MOUTH EVERY DAY 90 capsule 3   zonisamide (ZONEGRAN) 100 MG capsule Take 1 capsule (100 mg total) by mouth daily. (Patient taking differently: Take 100 mg by mouth at bedtime.) 90 capsule 3   Facility-Administered Medications Prior to Visit  Medication Dose Route Frequency Provider Last Rate Last Admin   sodium chloride flush (NS) 0.9 % injection 10 mL  10 mL Intravenous PRN Janina Mayo, MD   10 mL at 07/17/21 0820    PAST MEDICAL HISTORY: 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   Anxiety    Depression    GERD (gastroesophageal reflux disease)    Headache(784.0)    IBS (irritable bowel syndrome)    Migraines    PFO (patent foramen ovale)    Stroke (HCC)    Uterine fibroids affecting pregnancy     PAST SURGICAL HISTORY: Past Surgical History:  Procedure Laterality Date   Bilateral Inferior tubinate reduction  1998   Epicondylitis Right 05/2009   with ulnar nerve decompression    TONSILLECTOMY      FAMILY HISTORY: Family History  Problem Relation Age of Onset   High blood pressure Mother    Hyperlipidemia Mother    Gout Father    High blood pressure Father    Diabetes Mellitus I Sister    Other Sister        seizure disorder   Alzheimer's disease Maternal Grandmother    Cancer Paternal Grandmother        colon   Parkinson's disease Maternal Grandfather    Kidney failure Other    Breast cancer Neg Hx     SOCIAL HISTORY: Social History   Socioeconomic History   Marital status: Single    Spouse name: Not on file   Number of children: 0   Years of education: 2 Masters   Highest education level: Not on file  Occupational History   Occupation: PHYSICIAN ASSISTANT     Employer: [employer]  Tobacco Use   Smoking status: Never   Smokeless tobacco: Never  Substance and Sexual Activity   Alcohol use: Yes    Comment: rare   Drug use: No   Sexual activity: Not Currently    Partners: Male    Birth control/protection: Post-menopausal  Other Topics Concern   Not on file  Social History Narrative  Patient is single no children, right handed,    Education level is 2 Master's degree   Caffeine consumption is 0   Social Determinants of Radio broadcast assistant Strain: Not on file  Food Insecurity: Not on file  Transportation Needs: Not on file  Physical Activity: Not on file  Stress: Not on file  Social Connections: Not on file  Intimate Partner Violence: Not on file      PHYSICAL EXAM Generalized: Well developed, in no acute distress   Neurological examination  Mentation: Alert oriented to time, place, history taking. Follows all commands speech and language fluent Cranial nerve II-XII Facial symmetry noted. uvula tongue midline. Head turning and shoulder shrug  were normal and symmetric. Reflexes: UTA  DIAGNOSTIC DATA (LABS, IMAGING, TESTING) - I reviewed patient records, labs, notes, testing and imaging myself where available.  Lab Results  Component Value Date   WBC 5.5 06/10/2021   HGB 11.4 (L) 06/10/2021   HCT 34.2 (L) 06/10/2021   MCV 90.7 06/10/2021   PLT 251 06/10/2021      Component Value Date/Time   NA 140 06/10/2021 0504   K 3.3 (L) 06/10/2021 0504   CL 110 06/10/2021 0504   CO2 21 (L) 06/10/2021 0504   GLUCOSE 167 (H) 06/10/2021 0504   BUN 26 (H) 06/10/2021 0504   CREATININE 1.00 06/10/2021 0504   CALCIUM 9.0 06/10/2021 0504   PROT 5.9 (L) 06/10/2021 0504   ALBUMIN 3.7 06/10/2021 0504   AST 19 06/10/2021 0504   ALT 16 06/10/2021 0504   ALKPHOS 46 06/10/2021 0504   BILITOT 0.8 06/10/2021 0504   GFRNONAA >60 06/10/2021 0504   GFRAA >90 02/18/2014 2130   No results found for: CHOL, HDL,  LDLCALC, LDLDIRECT, TRIG, CHOLHDL No results found for: HGBA1C No results found for: VITAMINB12 Lab Results  Component Value Date   TSH 3.228 06/10/2021      ASSESSMENT AND PLAN 61 y.o. year old female  has a past medical history of Abnormal Pap smear, Anxiety, Depression, GERD (gastroesophageal reflux disease), Headache(784.0), IBS (irritable bowel syndrome), Migraines, PFO (patent foramen ovale), Stroke ([place]), and Uterine fibroids affecting pregnancy. here with     Migraine headaches   Continue emgaility 120 mg monthly injection  Continue Effexor 37.5 mg daily  Continue Zonegran 100 mg at bedtime Continue Nurtec as an abortive therapy- has to take Tylenol/Ibuprofen or magnesium with it.   Insomnia   Continue Ambien CR 12.5 mg at bedtime Continue Melatonin 10 mg at bedtime Asking about trying new medication Quviviq- will discuss with Dr. Brett Fairy  Muscle spasms  Continue Flexeril 5 mg at bedtime PRN  I spent 15 minutes with the patient. 50% of this time was spent   Ward Givens, MSN, NP-C 08/29/2021, 8:43 AM [facility] [address], [place], [place] [address] [phone]

## 2021-09-07 ENCOUNTER — Other Ambulatory Visit: Payer: Self-pay | Admitting: Obstetrics and Gynecology

## 2021-09-07 DIAGNOSIS — Z1231 Encounter for screening mammogram for malignant neoplasm of breast: Secondary | ICD-10-CM

## 2021-09-29 ENCOUNTER — Ambulatory Visit: Payer: Commercial Managed Care - PPO | Admitting: Internal Medicine

## 2021-10-31 NOTE — Progress Notes (Deleted)
61 y.o. G0P0000 Single Black or African American Not Hispanic or Latino female here for annual exam.   ?  ? ?Patient's last menstrual period was 08/28/2013 (exact date).          ?Sexually active: {yes no:314532}  ?The current method of family planning is {contraception:315051}.    ?Exercising: {yes no:314532}  {types:19826} ?Smoker:  {YES NO:22349} ? ?Health Maintenance: ?Pap:   09-06-17 neg HPV HR neg ?History of abnormal Pap:  yes in 2000, had a colposcopy no treatment needed.  ?MMG:  11/08/21 scheduled *** ?BMD:  2005 heel test  ?Colonoscopy: 2019 F/U 10 years  ?TDaP:  2019  ?Gardasil: NA  ?  ? ? reports that she has never smoked. She has never used smokeless tobacco. She reports current alcohol use. She reports that she does not use drugs. ? ?Past Medical History:  ?Diagnosis Date  ? Abnormal Pap smear   ? ASCUS-08/1998  LGSIL/HPV- 01/1999  ? Anxiety   ? Depression   ? GERD (gastroesophageal reflux disease)   ? Headache(784.0)   ? IBS (irritable bowel syndrome)   ? Migraines   ? PFO (patent foramen ovale)   ? Stroke [facility])   ? Uterine fibroids affecting pregnancy   ? ? ?Past Surgical History:  ?Procedure Laterality Date  ? Bilateral Inferior tubinate reduction  1998  ? Epicondylitis Right 05/2009  ? with ulnar nerve decompression   ? TONSILLECTOMY    ? ? ?Current Outpatient Medications  ?Medication Sig Dispense Refill  ? zolpidem (AMBIEN CR) 12.5 MG CR tablet TAKE 1 TABLET BY MOUTH EVERY DAY AT BEDTIME AS NEEDED FOR SLEEP 30 tablet 5  ? acetaminophen (TYLENOL) 500 MG tablet Take 1,000 mg by mouth every 8 (eight) hours as needed for moderate pain or headache.    ? cetirizine (ZYRTEC) 10 MG tablet Take 10 mg by mouth daily as needed for allergies.    ? Cholecalciferol (VITAMIN D3) 50 MCG (2000 UT) TABS Take 2,000 Units by mouth daily.    ? clobetasol ointment (TEMOVATE) 0.05 % Apply a thin layer on scalp (vertex) at night time 3 times / week    ? cyclobenzaprine (FLEXERIL) 5 MG tablet Take 1 tablet (5 mg total) by  mouth at bedtime as needed. (Patient taking differently: Take 2.5-5 mg by mouth at bedtime as needed for muscle spasms.) 30 tablet 0  ? doxycycline (VIBRAMYCIN) 100 MG capsule Take 100 mg by mouth See admin instructions. Qd x 6 weeks    ? finasteride (PROSCAR) 5 MG tablet Take 2.5 mg by mouth daily.    ? Fluocinolone Acetonide Scalp 0.01 % OIL Apply 1 application topically See admin instructions. 2-3 times per week    ? fluticasone (FLONASE) 50 MCG/ACT nasal spray Place 2 sprays into the nose daily as needed for allergies.    ? Galcanezumab-gnlm (EMGALITY) 120 MG/ML SOAJ Inject 120 mg into the skin every 30 (thirty) days. 3.36 mL 3  ? ketorolac (TORADOL) 10 MG tablet Take 1 tablet (10 mg total) by mouth every 6 (six) hours as needed. (Patient taking differently: Take 10 mg by mouth every 6 (six) hours as needed (migraine).) 10 tablet 5  ? LINZESS 72 MCG capsule Take 72 mcg by mouth daily.    ? Melatonin 10 MG TABS Take 10 mg by mouth at bedtime.    ? Minoxidil 5 % FOAM Apply topically.    ? NURTEC 75 MG TBDP TAKE 1 TABLET BY MOUTH DAILY AS NEEDED. TAKE AT THE ONSET OF  MIGRAINE. ONLY 1 TABLET IN 24 HOUR. (Patient taking differently: Take 75 mg by mouth as needed (migraine).) 8 tablet 7  ? omeprazole (PRILOSEC) 40 MG capsule Take 40 mg by mouth daily.   11  ? Probiotic Product (PROBIOTIC PO) Take 1 capsule by mouth at bedtime.    ? promethazine (PHENERGAN) 12.5 MG tablet Take 1 tablet (12.5 mg total) by mouth every 6 (six) hours as needed for nausea or vomiting. 90 tablet 0  ? triamcinolone cream (KENALOG) 0.1 % Apply 1 application topically 2 (two) times daily as needed (itching/rash).    ? venlafaxine XR (EFFEXOR-XR) 37.5 MG 24 hr capsule TAKE 1 CAPSULE BY MOUTH EVERY DAY 90 capsule 3  ? zonisamide (ZONEGRAN) 100 MG capsule Take 1 capsule (100 mg total) by mouth daily. (Patient taking differently: Take 100 mg by mouth at bedtime.) 90 capsule 3  ? ?No current facility-administered medications for this visit.   ? ?Facility-Administered Medications Ordered in Other Visits  ?Medication Dose Route Frequency Provider Last Rate Last Admin  ? sodium chloride flush (NS) 0.9 % injection 10 mL  10 mL Intravenous PRN Janina Mayo, MD   10 mL at 07/17/21 0820  ? ? ?Family History  ?Problem Relation Age of Onset  ? High blood pressure Mother   ? Hyperlipidemia Mother   ? Gout Father   ? High blood pressure Father   ? Diabetes Mellitus I Sister   ? Other Sister   ?     seizure disorder  ? Alzheimer's disease Maternal Grandmother   ? Cancer Paternal Grandmother   ?     colon  ? Parkinson's disease Maternal Grandfather   ? Kidney failure Other   ? Breast cancer Neg Hx   ? ? ?Review of Systems ? ?Exam:   ?LMP 08/28/2013 (Exact Date)   Weight change: '@WEIGHTCHANGE'$ @ Height:      ?Ht Readings from Last 3 Encounters:  ?06/29/21 5' 3.5" (1.613 m)  ?06/10/21 '5\' 3"'$  (1.6 m)  ?02/28/21 5' 3.5" (1.613 m)  ? ? ?General appearance: alert, cooperative and appears stated age ?Head: Normocephalic, without obvious abnormality, atraumatic ?Neck: no adenopathy, supple, symmetrical, trachea midline and thyroid {CHL AMB PHY EX THYROID NORM DEFAULT:[phone]::"normal to inspection and palpation"} ?Lungs: clear to auscultation bilaterally ?Cardiovascular: regular rate and rhythm ?Breasts: {Exam; breast:13139::"normal appearance, no masses or tenderness"} ?Abdomen: soft, non-tender; non distended,  no masses,  no organomegaly ?Extremities: extremities normal, atraumatic, no cyanosis or edema ?Skin: Skin color, texture, turgor normal. No rashes or lesions ?Lymph nodes: Cervical, supraclavicular, and axillary nodes normal. ?No abnormal inguinal nodes palpated ?Neurologic: Grossly normal ? ? ?Pelvic: External genitalia:  no lesions ?             Urethra:  normal appearing urethra with no masses, tenderness or lesions ?             Bartholins and Skenes: normal    ?             Vagina: normal appearing vagina with normal color and discharge, no lesions ?              Cervix: {CHL AMB PHY EX CERVIX NORM DEFAULT:[phone]::"no lesions"} ?              ?Bimanual Exam:  Uterus:  {CHL AMB PHY EX UTERUS NORM [phone]::"normal size, contour, position, consistency, mobility, non-tender"} ?             Adnexa: {CHL AMB PHY EX ADNEXA NO MASS  [phone]::"no mass, fullness, tenderness"} ?              Rectovaginal: Confirms ?              Anus:  normal sphincter tone, no lesions ? ?*** chaperoned for the exam. ? ?A:  Well Woman with normal exam ? ?P:    ? ? ? ?

## 2021-11-07 ENCOUNTER — Ambulatory Visit: Payer: Commercial Managed Care - PPO

## 2021-11-07 NOTE — Progress Notes (Signed)
? ?  Vicki Wilson 1961-03-25 [id number] ? ? ?History: Postmenopausal 61 y.o. presents for annual exam. No gyn concerns. Works as a PA at the [employer] ? ? ?Gynecologic History ?Postmenopausal ?Last Pap: 09/06/17. Results were: normal ?Last mammogram: 11/02/20. Results were: normal ?Last colonoscopy: 06/2018, repeat 10 years ?DEXA: 2005 heel ?HRT use: never ?Sexually active: no ? ?Obstetric History ?OB History  ?Gravida Para Term Preterm AB Living  ?0 0 0 0 0 0  ?SAB IAB Ectopic Multiple Live Births  ?0 0 0 0 0  ? ? ? ?The following portions of the patient's history were reviewed and updated as appropriate: allergies, current medications, past family history, past medical history, past social history, past surgical history, and problem list. ? ?Review of Systems ?Pertinent items noted in HPI and remainder of comprehensive ROS otherwise negative.  ?Past medical history, past surgical history, family history and social history were all reviewed and documented in the EPIC chart. ? ?Exam: ? ?Vitals:  ? 11/08/21 0756  ?BP: 112/78  ?Weight: 132 lb (59.9 kg)  ?Height: 5' 2.75" (1.594 m)  ? ?Body mass index is 23.57 kg/m?. ? ?General appearance:  Normal ?Thyroid:  Symmetrical, normal in size, without palpable masses or nodularity. ?Respiratory ? Auscultation:  Clear without wheezing or rhonchi ?Cardiovascular ? Auscultation:  Regular rate, without rubs, murmurs or gallops ? Edema/varicosities:  Not grossly evident ?Abdominal ? Soft,nontender, without masses, guarding or rebound. ? Liver/spleen:  No organomegaly noted ? Hernia:  None appreciated ? Skin ? Inspection:  Grossly normal ?Breasts: Examined lying and sitting.  ? Right: Without masses, retractions, nipple discharge or axillary adenopathy. ? ? Left: Without masses, retractions, nipple discharge or axillary adenopathy. ?Genitourinary  ? Inguinal/mons:  Normal without inguinal adenopathy ? External genitalia:  Normal appearing vulva with no masses, tenderness,  or lesions ? BUS/Urethra/Skene's glands:  Normal ? Vagina:  Normal appearing with normal color and discharge, no lesions. Atrophy: moderate   ? Cervix:  Normal appearing without discharge or lesions ? Uterus:  Normal in size, shape and contour.  Midline and mobile, nontender ? Adnexa/parametria:   ?  Rt: Normal in size, without masses or tenderness. ?  Lt: Normal in size, without masses or tenderness. ? Anus and perineum: Normal ?  ? ?Patient informed chaperone available to be present for breast and pelvic exam. Patient has requested no chaperone to be present. Patient has been advised what will be completed during breast and pelvic exam.  ? ?Assessment/Plan:   ?1. Well woman exam with routine gynecological exam ?Pap due 2024 ? ?2. Screening for osteoporosis ?Schedule DEXA- requests breast center for ease of scheduling with her work schedule ?- DG Bone Density; Future ? ?3. Vaginal atrophy ?Begin vit E suppositories  ? ?4. Screening mammogram for breast cancer ? Appt at the breast center this afternoon ? ?Discussed SBE, colonoscopy and DEXA screening as directed. Recommend 183mns of exercise weekly, including weight bearing exercise. Encouraged the use of seatbelts and sunscreen.  ?Return in 1 year for annual or sooner prn. ? ?CKerry DoryWHNP-BC, 8:43 AM 11/08/2021  ?

## 2021-11-08 ENCOUNTER — Other Ambulatory Visit: Payer: Self-pay

## 2021-11-08 ENCOUNTER — Ambulatory Visit
Admission: RE | Admit: 2021-11-08 | Discharge: 2021-11-08 | Disposition: A | Discharge status: Home / Self Care | Payer: Commercial Managed Care - PPO | Source: Ambulatory Visit | Attending: Obstetrics and Gynecology | Admitting: Obstetrics and Gynecology

## 2021-11-08 ENCOUNTER — Ambulatory Visit: Payer: Commercial Managed Care - PPO | Admitting: Obstetrics and Gynecology

## 2021-11-08 ENCOUNTER — Ambulatory Visit (INDEPENDENT_AMBULATORY_CARE_PROVIDER_SITE_OTHER): Payer: Commercial Managed Care - PPO | Admitting: Radiology

## 2021-11-08 ENCOUNTER — Ambulatory Visit: Payer: Commercial Managed Care - PPO

## 2021-11-08 ENCOUNTER — Encounter: Payer: Self-pay | Admitting: Radiology

## 2021-11-08 VITALS — BP 112/78 | Ht 62.75 in | Wt 132.0 lb

## 2021-11-08 DIAGNOSIS — Z01419 Encounter for gynecological examination (general) (routine) without abnormal findings: Secondary | ICD-10-CM | POA: Diagnosis not present

## 2021-11-08 DIAGNOSIS — Z1231 Encounter for screening mammogram for malignant neoplasm of breast: Secondary | ICD-10-CM | POA: Diagnosis not present

## 2021-11-08 DIAGNOSIS — N952 Postmenopausal atrophic vaginitis: Secondary | ICD-10-CM

## 2021-11-08 DIAGNOSIS — Z13820 Encounter for screening for osteoporosis: Secondary | ICD-10-CM | POA: Diagnosis not present

## 2021-12-10 ENCOUNTER — Other Ambulatory Visit: Payer: Self-pay | Admitting: Adult Health

## 2022-01-16 ENCOUNTER — Encounter: Payer: Self-pay | Admitting: Adult Health

## 2022-01-17 ENCOUNTER — Other Ambulatory Visit: Payer: Self-pay | Admitting: Adult Health

## 2022-01-18 ENCOUNTER — Other Ambulatory Visit: Payer: Self-pay | Admitting: Adult Health

## 2022-01-18 DIAGNOSIS — F5104 Psychophysiologic insomnia: Secondary | ICD-10-CM

## 2022-01-22 NOTE — Telephone Encounter (Signed)
Last visit 08/29/21 Next visit TBD Per [place] registry, last filled on 12/22/21 #30/30. Rx refill x 1 sent to [facility].

## 2022-02-19 ENCOUNTER — Encounter: Payer: Self-pay | Admitting: Adult Health

## 2022-02-19 ENCOUNTER — Other Ambulatory Visit: Payer: Self-pay | Admitting: Adult Health

## 2022-02-19 DIAGNOSIS — F5104 Psychophysiologic insomnia: Secondary | ICD-10-CM

## 2022-02-28 ENCOUNTER — Other Ambulatory Visit: Payer: Self-pay | Admitting: Adult Health

## 2022-03-01 ENCOUNTER — Telehealth: Payer: Self-pay | Admitting: *Deleted

## 2022-03-01 ENCOUNTER — Encounter: Payer: Self-pay | Admitting: *Deleted

## 2022-03-01 NOTE — Telephone Encounter (Signed)
Emgality PA, Key: [id number], [id number]. Immediately approved. Case PF:[id number], Coverage Start Date:01/30/2022; Coverage End Date:03/01/2023

## 2022-03-19 ENCOUNTER — Other Ambulatory Visit: Payer: Self-pay | Admitting: Adult Health

## 2022-03-19 DIAGNOSIS — F5104 Psychophysiologic insomnia: Secondary | ICD-10-CM

## 2022-03-20 NOTE — Telephone Encounter (Signed)
Last visit: 08/29/21  Next visit: 04/11/22 Per [place] registry, last filled #30/30 on 02/20/22. Rx refill sent to [facility] NP

## 2022-03-25 ENCOUNTER — Encounter: Payer: Self-pay | Admitting: Adult Health

## 2022-03-26 MED ORDER — ZONISAMIDE 100 MG PO CAPS: 100.0000 mg | ORAL_CAPSULE | Freq: Every day | ORAL | 3 refills | Status: DC

## 2022-04-10 NOTE — Progress Notes (Unsigned)
PATIENT: Vicki Wilson DOB: 11-22-1960  REASON FOR VISIT: follow up HISTORY FROM: patient PRIMARY NEUROLOGIST: Dr. Brett Fairy  Chief Complaint  Patient presents with   Rm 19    Here for 6 month migraine follow-up. She states she has been in a 16 out of 17 day pain cycle. She has had a couple days of relief but feels they are starting back up again. The last migraine was last Sunday. She is on Emgality monthly. She is on Zonisamide nightly, Effexor every morning, Nurtec, NSAIDs, Caffeine PRN. Sometimes the Nurtec works and sometimes it doesn't but she has to take it with the additional OTC medications. She uses Magnesium PRN migraines.      HISTORY OF PRESENT ILLNESS: Today 04/11/22:  Vicki Wilson is a 61 year old female with a history of migraine headaches and insomnia.  She returns today for follow-up. Emgality shot was a month ago ( July 30th). Requested a refill today. Headache started Aug 7th. Reports this lasted 16 days.  Since had maybe one day of relief but the headache came back. Abortive medication nurtrec with NSAID did not help. Also tried Caffine with no benefit. Some headaches will last > 8 hours before it resolves. Sometimes she will wake up with a headache. These headaches are more severe and harder to get relief. Does not tolerate steroids well. Last time she took an NSAID was Sunday. No headache today.   On Average ( not including this last month) she has about 3 headaches a week.   HISTORY  08/29/21:   Vicki Wilson is a 61 year old female with a history of migraine headaches and insomnia.  She returns today for follow-up.  She reports that [employer], Zonegran and Effexor continues to work okay for her migraines.  She states he recently she did have an ongoing headache that kept her out of work.  She typically takes Nurtec but usually has to pair it with Tylenol or ibuprofen.  On occasion she will use magnesium as well.   She continues to use Ambien CR 12.5 mg for insomnia.   She is asking about the new medication Quviviq.  She plans to check with her insurance to see if it is approved.   Patient has a prescription of Flexeril that she uses for muscle spasms.  She states that she uses it very rarely.  Often uses it for neck pain.  REVIEW OF SYSTEMS: Out of a complete 14 system review of symptoms, the patient complains only of the following symptoms, and all other reviewed systems are negative.  ALLERGIES: Allergies  Allergen Reactions   Erythromycin     GI upset   Meloxicam Other (See Comments) and Nausea Only   Triptans     myalgias    HOME MEDICATIONS: Outpatient Medications Prior to Visit  Medication Sig Dispense Refill   acetaminophen (TYLENOL) 500 MG tablet Take 1,000 mg by mouth every 8 (eight) hours as needed for moderate pain or headache.     Aspirin-Acetaminophen-Caffeine (EXCEDRIN MIGRAINE PO) as needed.     cetirizine (ZYRTEC) 10 MG tablet Take 10 mg by mouth daily as needed for allergies.     Cholecalciferol (VITAMIN D3) 50 MCG (2000 UT) TABS Take 2,000 Units by mouth daily.     clobetasol ointment (TEMOVATE) 0.05 % Apply a thin layer on scalp (vertex) at night time 3 times / week     cyclobenzaprine (FLEXERIL) 5 MG tablet Take 1 tablet (5 mg total) by mouth at bedtime as needed. (Patient  taking differently: Take 2.5-5 mg by mouth at bedtime as needed for muscle spasms.) 30 tablet 0   EMGALITY 120 MG/ML SOAJ INJECT 120 MG INTO THE SKIN EVERY 30 (THIRTY) DAYS. 1 mL 3   finasteride (PROSCAR) 5 MG tablet Take 2.5 mg by mouth daily.     Fluocinolone Acetonide Scalp 0.01 % OIL Apply 1 application topically See admin instructions. 2-3 times per week     fluticasone (FLONASE) 50 MCG/ACT nasal spray Place 2 sprays into the nose daily as needed for allergies.     ketorolac (TORADOL) 10 MG tablet Take 1 tablet (10 mg total) by mouth every 6 (six) hours as needed. (Patient taking differently: Take 10 mg by mouth every 6 (six) hours as needed (migraine).) 10  tablet 5   LINZESS 72 MCG capsule Take 72 mcg by mouth daily.     MAGNESIUM PO Take 800 mg by mouth as needed.     Melatonin 10 MG TABS Take 10 mg by mouth at bedtime.     NURTEC 75 MG TBDP TAKE 1 TABLET BY MOUTH DAILY AS NEEDED. TAKE AT THE ONSET OF MIGRAINE. ONLY 1 TABLET IN 24 HOUR. 8 tablet 7   omeprazole (PRILOSEC) 40 MG capsule Take 40 mg by mouth daily.   11   Probiotic Product (PROBIOTIC PO) Take 1 capsule by mouth at bedtime.     promethazine (PHENERGAN) 12.5 MG tablet Take 1 tablet (12.5 mg total) by mouth every 6 (six) hours as needed for nausea or vomiting. 90 tablet 0   triamcinolone cream (KENALOG) 0.1 % Apply 1 application topically 2 (two) times daily as needed (itching/rash).     venlafaxine XR (EFFEXOR-XR) 37.5 MG 24 hr capsule TAKE 1 CAPSULE BY MOUTH EVERY DAY 90 capsule 3   zolpidem (AMBIEN CR) 12.5 MG CR tablet TAKE 1 TABLET BY MOUTH AT BEDTIME AS NEEDED FOR SLEEP 30 tablet 0   zonisamide (ZONEGRAN) 100 MG capsule Take 1 capsule (100 mg total) by mouth daily. 90 capsule 3   Minoxidil 5 % FOAM Apply topically. (Patient not taking: Reported on 11/08/2021)     Facility-Administered Medications Prior to Visit  Medication Dose Route Frequency Provider Last Rate Last Admin   sodium chloride flush (NS) 0.9 % injection 10 mL  10 mL Intravenous PRN Janina Mayo, MD   10 mL at 07/17/21 0820    PAST MEDICAL HISTORY: 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   Alopecia    also with female pattern hair loss   Anxiety    Depression    GERD (gastroesophageal reflux disease)    Headache(784.0)    IBS (irritable bowel syndrome)    Migraines    PFO (patent foramen ovale)    Stroke [facility])    Uterine fibroids affecting pregnancy     PAST SURGICAL HISTORY: Past Surgical History:  Procedure Laterality Date   Bilateral Inferior tubinate reduction  1998   Epicondylitis Right 05/2009   with ulnar nerve decompression     TONSILLECTOMY      FAMILY HISTORY: Family History  Problem Relation Age of Onset   High blood pressure Mother    Hyperlipidemia Mother    Lung cancer Mother    Gout Father    High blood pressure Father    Diabetes Mellitus I Sister    Other Sister        seizure disorder   Breast cancer Sister  1/2 sister (different fathers)   Alzheimer's disease Maternal Grandmother    Parkinson's disease Maternal Grandfather    Cancer Paternal Grandmother        colon   Kidney failure Other     SOCIAL HISTORY: Social History   Socioeconomic History   Marital status: Single    Spouse name: Not on file   Number of children: 0   Years of education: 2 Masters   Highest education level: Not on file  Occupational History   Occupation: PHYSICIAN ASSISTANT    Employer: [employer]  Tobacco Use   Smoking status: Never   Smokeless tobacco: Never  Substance and Sexual Activity   Alcohol use: Yes    Comment: rare   Drug use: No   Sexual activity: Not Currently    Partners: Male    Birth control/protection: Post-menopausal  Other Topics Concern   Not on file  Social History Narrative   Patient is single no children, right handed,    Education level is 2 Master's degree   Caffeine consumption is 1 cup/day of tea   Social Determinants of Health   Financial Resource Strain: Not on file  Food Insecurity: Not on file  Transportation Needs: Not on file  Physical Activity: Not on file  Stress: Not on file  Social Connections: Not on file  Intimate Partner Violence: Not on file      PHYSICAL EXAM  Vitals:   04/11/22 0823  BP: 124/88  Pulse: 81  Weight: 135 lb (61.2 kg)  Height: 5' 3.5" (1.613 m)   Body mass index is 23.54 kg/m.  Generalized: Well developed, in no acute distress   Neurological examination  Mentation: Alert oriented to time, place, history taking. Follows all commands speech and language fluent Cranial nerve II-XII: Pupils were  equal round reactive to light. Extraocular movements were full, visual field were full on confrontational test. Facial sensation and strength were normal. Head turning and shoulder shrug  were normal and symmetric. Motor: The motor testing reveals 5 over 5 strength of all 4 extremities. Good symmetric motor tone is noted throughout.  Sensory: Sensory testing is intact to soft touch on all 4 extremities. No evidence of extinction is noted.  Coordination: Cerebellar testing reveals good finger-nose-finger and heel-to-shin bilaterally.  Gait and station: Gait is normal.  Romberg is negative. No drift is seen.  Reflexes: Deep tendon reflexes are symmetric and normal bilaterally.   DIAGNOSTIC DATA (LABS, IMAGING, TESTING) - I reviewed patient records, labs, notes, testing and imaging myself where available.  Lab Results  Component Value Date   WBC 5.5 06/10/2021   HGB 11.4 (L) 06/10/2021   HCT 34.2 (L) 06/10/2021   MCV 90.7 06/10/2021   PLT 251 06/10/2021      Component Value Date/Time   NA 140 06/10/2021 0504   K 3.3 (L) 06/10/2021 0504   CL 110 06/10/2021 0504   CO2 21 (L) 06/10/2021 0504   GLUCOSE 167 (H) 06/10/2021 0504   BUN 26 (H) 06/10/2021 0504   CREATININE 1.00 06/10/2021 0504   CALCIUM 9.0 06/10/2021 0504   PROT 5.9 (L) 06/10/2021 0504   ALBUMIN 3.7 06/10/2021 0504   AST 19 06/10/2021 0504   ALT 16 06/10/2021 0504   ALKPHOS 46 06/10/2021 0504   BILITOT 0.8 06/10/2021 0504   GFRNONAA >60 06/10/2021 0504   GFRAA >90 02/18/2014 2130    Lab Results  Component Value Date   TSH 3.228 06/10/2021  ASSESSMENT AND PLAN 61 y.o. year old female  has a past medical history of Abnormal Pap smear, Alopecia, Anxiety, Depression, GERD (gastroesophageal reflux disease), Headache(784.0), IBS (irritable bowel syndrome), Migraines, PFO (patent foramen ovale), Stroke ([place]), and Uterine fibroids affecting pregnancy. here with :  1.  Migraine headaches  -Stop Emgality -Start  Qulipta 60 mg daily -Continue Zonegran 100 mg daily -Continue Nurtec for abortive therapy  2.  Insomnia  -Continue Ambien CR 12.5 mg at bedtime as needed -Patient asking about Quviviq-patient may be interested in this in the future  Follow-up in 6 months or sooner if needed     Ward Givens, MSN, NP-C 04/11/2022, 8:25 AM [facility] [address] [place], [place] [address] [phone]

## 2022-04-11 ENCOUNTER — Encounter: Payer: Self-pay | Admitting: Adult Health

## 2022-04-11 ENCOUNTER — Ambulatory Visit (INDEPENDENT_AMBULATORY_CARE_PROVIDER_SITE_OTHER): Payer: Commercial Managed Care - PPO | Admitting: Adult Health

## 2022-04-11 VITALS — BP 124/88 | HR 81 | Ht 63.5 in | Wt 135.0 lb

## 2022-04-11 DIAGNOSIS — G43009 Migraine without aura, not intractable, without status migrainosus: Secondary | ICD-10-CM | POA: Diagnosis not present

## 2022-04-11 DIAGNOSIS — G43019 Migraine without aura, intractable, without status migrainosus: Secondary | ICD-10-CM

## 2022-04-11 DIAGNOSIS — F5104 Psychophysiologic insomnia: Secondary | ICD-10-CM | POA: Diagnosis not present

## 2022-04-11 MED ORDER — QULIPTA 60 MG PO TABS: 60.0000 mg | ORAL_TABLET | Freq: Every day | ORAL | 11 refills | Status: DC

## 2022-04-11 NOTE — Patient Instructions (Addendum)
[employer] Qulipta 60 mg daily   Atogepant Tablets What is this medication? ATOGEPANT (a TOE je pant) prevents migraines. It works by blocking a substance in the body that causes migraines. This medicine may be used for other purposes; ask your health care provider or pharmacist if you have questions. COMMON BRAND NAME(S): QULIPTA What should I tell my care team before I take this medication? They need to know if you have any of these conditions: Kidney disease Liver disease An unusual or allergic reaction to atogepant, other medications, foods, dyes, or preservatives Pregnant or trying to get pregnant Breast-feeding How should I use this medication? Take this medication by mouth with water. Take it as directed on the prescription label at the same time every day. You can take it with or without food. If it upsets your stomach, take it with food. Keep taking it unless your care team tells you to stop. Talk to your care team about the use of this medication in children. Special care may be needed. Overdosage: If you think you have taken too much of this medicine contact a poison control center or emergency room at once. NOTE: This medicine is only for you. Do not share this medicine with others. What if I miss a dose? If you miss a dose, take it as soon as you can. If it is almost time for your next dose, take only that dose. Do not take double or extra doses. What may interact with this medication? Carbamazepine Certain medications for fungal infections, such as itraconazole, ketoconazole Clarithromycin Cyclosporine Efavirenz Etravirine Phenytoin Rifampin St. John's wort This list may not describe all possible interactions. Give your health care provider a list of all the medicines, herbs, non-prescription drugs, or dietary supplements you use. Also tell them if you smoke, drink alcohol, or use illegal drugs. Some items may interact with your medicine. What should I watch  for while using this medication? Visit your care team for regular checks on your progress. Tell your care team if your symptoms do not start to get better or if they get worse. What side effects may I notice from receiving this medication? Side effects that you should report to your care team as soon as possible: Allergic reactions--skin rash, itching, hives, swelling of the face, lips, tongue, or throat Side effects that usually do not require medical attention (report to your care team if they continue or are bothersome): Constipation Fatigue Loss of appetite with weight loss Nausea This list may not describe all possible side effects. Call your doctor for medical advice about side effects. You may report side effects to FDA at 1-800-FDA-1088. Where should I keep my medication? Keep out of the reach of children and pets. Store at room temperature between 20 and 25 degrees C (68 and 77 degrees F). Get rid of any unused medication after the expiration date. To get rid of medications that are no longer needed or have expired: Take the medication to a medication take-back program. Check with your pharmacy or law enforcement to find a location. If you cannot return the medication, check the label or package insert to see if the medication should be thrown out in the garbage or flushed down the toilet. If you are not sure, ask your care team. If it is safe to put it in the trash, take the medication out of the container. Mix the medication with cat litter, dirt, coffee grounds, or other unwanted substance. Seal the mixture in a  bag or container. Put it in the trash. NOTE: This sheet is a summary. It may not cover all possible information. If you have questions about this medicine, talk to your doctor, pharmacist, or health care provider.  2023 Elsevier/Gold Standard (2021-09-18 00:00:00)

## 2022-04-22 ENCOUNTER — Other Ambulatory Visit: Payer: Self-pay | Admitting: Adult Health

## 2022-04-22 ENCOUNTER — Encounter: Payer: Self-pay | Admitting: Adult Health

## 2022-04-22 DIAGNOSIS — F5104 Psychophysiologic insomnia: Secondary | ICD-10-CM

## 2022-04-23 NOTE — Telephone Encounter (Signed)
Last seen 04/11/22 Next visit 10/16/22 Per [place] registry, last filled #30 per 30 on 03/24/2022. Rx refill sent to [facility] NP.

## 2022-04-26 ENCOUNTER — Ambulatory Visit
Admission: RE | Admit: 2022-04-26 | Discharge: 2022-04-26 | Disposition: A | Discharge status: Home / Self Care | Payer: Commercial Managed Care - PPO | Source: Ambulatory Visit | Attending: Radiology | Admitting: Radiology

## 2022-04-26 DIAGNOSIS — Z13820 Encounter for screening for osteoporosis: Secondary | ICD-10-CM

## 2022-05-02 DIAGNOSIS — Z0289 Encounter for other administrative examinations: Secondary | ICD-10-CM

## 2022-05-03 ENCOUNTER — Telehealth: Payer: Self-pay | Admitting: *Deleted

## 2022-05-03 NOTE — Telephone Encounter (Signed)
FMLA forms ready for provider signature.

## 2022-05-07 NOTE — Telephone Encounter (Signed)
FMLA form signed by Jinny Blossom NP and sent to medical records for processing.

## 2022-05-08 NOTE — Telephone Encounter (Signed)
Pt FMLA form faxed on 05/07/22

## 2022-06-26 ENCOUNTER — Encounter: Payer: Self-pay | Admitting: Adult Health

## 2022-06-26 MED ORDER — NURTEC 75 MG PO TBDP: ORAL_TABLET | ORAL | 3 refills | Status: DC

## 2022-06-28 ENCOUNTER — Telehealth: Payer: Self-pay | Admitting: *Deleted

## 2022-06-28 ENCOUNTER — Encounter: Payer: Self-pay | Admitting: Adult Health

## 2022-06-28 NOTE — Telephone Encounter (Signed)
Completed Nurtec PA on Cover My Meds. Key: [id number]. [employer] approved immediately.  [id number]:[id number];KYHCWC:[id number];Review Type:Prior Auth;Coverage Start Date:05/29/2022;Coverage End Date:06/28/2023;

## 2022-08-10 ENCOUNTER — Other Ambulatory Visit: Payer: Self-pay | Admitting: Adult Health

## 2022-09-13 ENCOUNTER — Other Ambulatory Visit: Payer: Self-pay | Admitting: Obstetrics and Gynecology

## 2022-09-13 DIAGNOSIS — Z1231 Encounter for screening mammogram for malignant neoplasm of breast: Secondary | ICD-10-CM

## 2022-10-16 ENCOUNTER — Ambulatory Visit: Payer: Commercial Managed Care - PPO | Admitting: Adult Health

## 2022-10-18 ENCOUNTER — Other Ambulatory Visit: Payer: Self-pay | Admitting: Adult Health

## 2022-10-18 DIAGNOSIS — F5104 Psychophysiologic insomnia: Secondary | ICD-10-CM

## 2022-10-21 ENCOUNTER — Encounter: Payer: Self-pay | Admitting: Adult Health

## 2022-10-22 NOTE — Telephone Encounter (Signed)
Next appt 11-27-2022.  Last seen 04-11-2022.  (Started rx for 6 month )

## 2022-11-11 ENCOUNTER — Other Ambulatory Visit: Payer: Self-pay | Admitting: Adult Health

## 2022-11-13 ENCOUNTER — Ambulatory Visit: Payer: Commercial Managed Care - PPO

## 2022-11-13 ENCOUNTER — Telehealth: Payer: Self-pay

## 2022-11-13 ENCOUNTER — Other Ambulatory Visit (HOSPITAL_COMMUNITY)
Admission: RE | Admit: 2022-11-13 | Discharge: 2022-11-13 | Disposition: A | Discharge status: Home / Self Care | Payer: Commercial Managed Care - PPO | Source: Ambulatory Visit | Attending: Radiology | Admitting: Radiology

## 2022-11-13 ENCOUNTER — Encounter: Payer: Self-pay | Admitting: Radiology

## 2022-11-13 ENCOUNTER — Ambulatory Visit (INDEPENDENT_AMBULATORY_CARE_PROVIDER_SITE_OTHER): Payer: Commercial Managed Care - PPO | Admitting: Radiology

## 2022-11-13 VITALS — BP 114/76 | Ht 62.75 in | Wt 127.0 lb

## 2022-11-13 DIAGNOSIS — Z01419 Encounter for gynecological examination (general) (routine) without abnormal findings: Secondary | ICD-10-CM

## 2022-11-13 DIAGNOSIS — N6321 Unspecified lump in the left breast, upper outer quadrant: Secondary | ICD-10-CM | POA: Diagnosis not present

## 2022-11-13 DIAGNOSIS — M8580 Other specified disorders of bone density and structure, unspecified site: Secondary | ICD-10-CM | POA: Diagnosis not present

## 2022-11-13 DIAGNOSIS — Z780 Asymptomatic menopausal state: Secondary | ICD-10-CM | POA: Diagnosis not present

## 2022-11-13 DIAGNOSIS — N644 Mastodynia: Secondary | ICD-10-CM

## 2022-11-13 NOTE — Telephone Encounter (Signed)
Dx mammo Received: Today Chrzanowski, Annitta Needs, NP  [facility] Triage Left breast mass 12:30, 2cm from areola, oblong, tender. Had screening mammo scheduled today at 9:40 advised patient to cancel and wait for diagnostic mammogram and u/s.

## 2022-11-13 NOTE — Telephone Encounter (Signed)
Spoke with patient and informed her of date/time. 

## 2022-11-13 NOTE — Progress Notes (Signed)
   Vicki Wilson 1961-06-29 [id number]   History: Postmenopausal 62 y.o. presents for annual exam. Complains of left breast lump and tenderness, noticed area last week. Has screening scheduled at the breast center today at 9:40am.    Gynecologic History Postmenopausal Last Pap: 2019. Results were: normal Last mammogram: 2023. Results were: normal Last colonoscopy: 2019 DEXA:2023 normal   Obstetric History OB History  Gravida Para Term Preterm AB Living  0 0 0 0 0 0  SAB IAB Ectopic Multiple Live Births  0 0 0 0 0     The following portions of the patient's history were reviewed and updated as appropriate: allergies, current medications, past family history, past medical history, past social history, past surgical history, and problem list.  Review of Systems Pertinent items noted in HPI and remainder of comprehensive ROS otherwise negative.  Past medical history, past surgical history, family history and social history were all reviewed and documented in the EPIC chart.  Exam:  Vitals:   11/13/22 0753  BP: 114/76  Weight: 127 lb (57.6 kg)  Height: 5' 2.75" (1.594 m)   Body mass index is 22.68 kg/m.  General appearance:  Normal Thyroid:  Symmetrical, normal in size, without palpable masses or nodularity. Respiratory  Auscultation:  Clear without wheezing or rhonchi Cardiovascular  Auscultation:  Regular rate, without rubs, murmurs or gallops  Edema/varicosities:  Not grossly evident Abdominal  Soft,nontender, without masses, guarding or rebound.  Liver/spleen:  No organomegaly noted  Hernia:  None appreciated  Skin  Inspection:  Grossly normal Breasts: Examined lying and sitting.   Right: Without masses, retractions, nipple discharge or axillary adenopathy.   Left: Oblong 2cmx1cm mass present at 12:30, tender to palpation Genitourinary   Inguinal/mons:  Normal without inguinal adenopathy  External genitalia:  Normal appearing vulva with no masses,  tenderness, or lesions  BUS/Urethra/Skene's glands:  Normal  Vagina:  Normal appearing with normal color and discharge, no lesions. Atrophy: moderate   Cervix:  Normal appearing without discharge or lesions  Uterus:  Normal in size, shape and contour.  Midline and mobile, nontender  Adnexa/parametria:     Rt: Normal in size, without masses or tenderness.   Lt: Normal in size, without masses or tenderness.  Anus and perineum: Normal    Patient informed chaperone available to be present for breast and pelvic exam. Patient has requested no chaperone to be present. Patient has been advised what will be completed during breast and pelvic exam.   Assessment/Plan:   1. Well woman exam with routine gynecological exam - Cytology - PAP( [place])  2. Mass of upper outer quadrant of left breast Diagnostic mammogram and ultrasound to further evaluate   3. Osteopenia after menopause Vit D3 with K2, calcium as tolerated- causes constipation, focus on getting it from diet Magnesium blend 500mg  nightly to help with absorption and constipation     Discussed SBE, colonoscopy and DEXA screening as directed. Recommend 143mins of exercise weekly, including weight bearing exercise. Encouraged the use of seatbelts and sunscreen.  Return in 1 year for annual or sooner prn.  Rubbie Battiest B WHNP-BC, 8:19 AM 11/13/2022

## 2022-11-13 NOTE — Telephone Encounter (Signed)
Breast Imaging orders placed and patient is scheduled at [facility] for 11/26/22 at 8:10am to check in 15 minutes early.

## 2022-11-15 LAB — CYTOLOGY - PAP
Comment: NEGATIVE
Diagnosis: NEGATIVE
High risk HPV: NEGATIVE

## 2022-11-21 ENCOUNTER — Other Ambulatory Visit: Payer: Self-pay | Admitting: Radiology

## 2022-11-21 DIAGNOSIS — N644 Mastodynia: Secondary | ICD-10-CM

## 2022-11-21 DIAGNOSIS — N63 Unspecified lump in unspecified breast: Secondary | ICD-10-CM

## 2022-11-21 DIAGNOSIS — N6320 Unspecified lump in the left breast, unspecified quadrant: Secondary | ICD-10-CM

## 2022-11-26 ENCOUNTER — Ambulatory Visit
Admission: RE | Admit: 2022-11-26 | Discharge: 2022-11-26 | Disposition: A | Discharge status: Home / Self Care | Payer: Commercial Managed Care - PPO | Source: Ambulatory Visit | Attending: Radiology | Admitting: Radiology

## 2022-11-26 DIAGNOSIS — N644 Mastodynia: Secondary | ICD-10-CM

## 2022-11-26 DIAGNOSIS — N63 Unspecified lump in unspecified breast: Secondary | ICD-10-CM

## 2022-11-26 DIAGNOSIS — N6320 Unspecified lump in the left breast, unspecified quadrant: Secondary | ICD-10-CM

## 2022-11-27 ENCOUNTER — Ambulatory Visit (INDEPENDENT_AMBULATORY_CARE_PROVIDER_SITE_OTHER): Payer: Commercial Managed Care - PPO | Admitting: Adult Health

## 2022-11-27 ENCOUNTER — Telehealth: Payer: Self-pay | Admitting: Adult Health

## 2022-11-27 ENCOUNTER — Encounter: Payer: Self-pay | Admitting: Adult Health

## 2022-11-27 VITALS — BP 128/83 | HR 73 | Ht 63.6 in | Wt 126.8 lb

## 2022-11-27 DIAGNOSIS — G43009 Migraine without aura, not intractable, without status migrainosus: Secondary | ICD-10-CM | POA: Diagnosis not present

## 2022-11-27 DIAGNOSIS — F5104 Psychophysiologic insomnia: Secondary | ICD-10-CM | POA: Diagnosis not present

## 2022-11-27 MED ORDER — KETOROLAC TROMETHAMINE 60 MG/2ML IM SOLN
30.0000 mg | Freq: Once | INTRAMUSCULAR | Status: AC
Start: 2022-11-27 — End: 2022-11-27
  Administered 2022-11-27: 30 mg via INTRAMUSCULAR

## 2022-11-27 NOTE — Addendum Note (Signed)
Addended by: Bertram Savin on: 11/27/2022 11:56 AM   Modules accepted: Orders

## 2022-11-27 NOTE — Progress Notes (Signed)
Patient returned to office after worsening headache, reports 6/10 pain. No new symptoms, verbal order given for  (1ml) Toradol. Administered  (29ml)Toradol in left deltoid. Patient tolerated well.

## 2022-11-27 NOTE — Telephone Encounter (Signed)
Patient came in and she was able to get 30 mg of Toradol IM

## 2022-11-27 NOTE — Progress Notes (Signed)
PATIENT: Vicki Wilson DOB: Jul 15, 1961  REASON FOR VISIT: follow up HISTORY FROM: patient PRIMARY NEUROLOGIST: Dr. Vickey Huger  Chief Complaint  Patient presents with   Follow-up    Rm 19. Alone. Pt reports she has been in a migraine pain cycle since Saturday. She states pain is mild. Bennie Pierini is helpful.     HISTORY OF PRESENT ILLNESS: Today 11/27/22:  Vicki Wilson is a 62 y.o. female with a history of Migraines. Returns today for follow-up. Bennie Pierini has helped. Migraines are not as frequent and severity is better. Reports that she had a headache that started Saturday that has been there off and on since then. Tried Nurtec on Sunday but not much relief.  Usually will take Nurtec without improvement but she did not this time.  Continues to take Ambien for insomnia.  Returns today for evaluation.  04/11/22: [name] is a 62 year old female with a history of migraine headaches and insomnia.  She returns today for follow-up. Emgality shot was a month ago ( July 30th). Requested a refill today. Headache started Aug 7th. Reports this lasted 16 days.  Since had maybe one day of relief but the headache came back. Abortive medication nurtrec with NSAID did not help. Also tried Caffine with no benefit. Some headaches will last > 8 hours before it resolves. Sometimes she will wake up with a headache. These headaches are more severe and harder to get relief. Does not tolerate steroids well. Last time she took an NSAID was Sunday. No headache today.   On Average ( not including this last month) she has about 3 headaches a week.   HISTORY  08/29/21:   Ms. Wonders is a 62 year old female with a history of migraine headaches and insomnia.  She returns today for follow-up.  She reports that [employer], Zonegran and Effexor continues to work okay for her migraines.  She states he recently she did have an ongoing headache that kept her out of work.  She typically takes Nurtec but usually has to pair it with  Tylenol or ibuprofen.  On occasion she will use magnesium as well.   She continues to use Ambien CR 12.5 mg for insomnia.  She is asking about the new medication Quviviq.  She plans to check with her insurance to see if it is approved.   Patient has a prescription of Flexeril that she uses for muscle spasms.  She states that she uses it very rarely.  Often uses it for neck pain.  REVIEW OF SYSTEMS: Out of a complete 14 system review of symptoms, the patient complains only of the following symptoms, and all other reviewed systems are negative.  ALLERGIES: Allergies  Allergen Reactions   Erythromycin     GI upset   Meloxicam Other (See Comments) and Nausea Only   Triptans     myalgias    HOME MEDICATIONS: Outpatient Medications Prior to Visit  Medication Sig Dispense Refill   acetaminophen (TYLENOL) 500 MG tablet Take 1,000 mg by mouth every 8 (eight) hours as needed for moderate pain or headache.     Aspirin-Acetaminophen-Caffeine (EXCEDRIN MIGRAINE PO) as needed.     Atogepant (QULIPTA) 60 MG TABS Take 60 mg by mouth daily. 30 tablet 11   cetirizine (ZYRTEC) 10 MG tablet Take 10 mg by mouth daily as needed for allergies.     clobetasol ointment (TEMOVATE) 0.05 % Apply a thin layer on scalp (vertex) at night time 3 times / week  cyclobenzaprine (FLEXERIL) 5 MG tablet Take 1 tablet (5 mg total) by mouth at bedtime as needed. (Patient taking differently: Take 2.5-5 mg by mouth at bedtime as needed for muscle spasms.) 30 tablet 0   finasteride (PROSCAR) 5 MG tablet Take 2.5 mg by mouth daily.     Fluocinolone Acetonide Scalp 0.01 % OIL Apply 1 application topically See admin instructions. 2-3 times per week     fluticasone (FLONASE) 50 MCG/ACT nasal spray Place 2 sprays into the nose daily as needed for allergies.     ketorolac (TORADOL) 10 MG tablet Take 1 tablet (10 mg total) by mouth every 6 (six) hours as needed. (Patient taking differently: Take 10 mg by mouth every 6 (six) hours as  needed (migraine).) 10 tablet 5   LINZESS 72 MCG capsule Take 72 mcg by mouth daily.     MAGNESIUM PO Take 500 mg by mouth at bedtime.     Melatonin 10 MG TABS Take 10 mg by mouth at bedtime.     olopatadine (PATANOL) 0.1 % ophthalmic solution Apply to eye.     omeprazole (PRILOSEC) 40 MG capsule Take 40 mg by mouth daily.   11   Probiotic Product (PROBIOTIC PO) Take 1 capsule by mouth at bedtime.     promethazine (PHENERGAN) 12.5 MG tablet Take 1 tablet (12.5 mg total) by mouth every 6 (six) hours as needed for nausea or vomiting. 90 tablet 0   Rimegepant Sulfate (NURTEC) 75 MG TBDP TAKE 1 TABLET BY MOUTH DAILY AS NEEDED. TAKE AT THE ONSET OF MIGRAINE. ONLY 1 TABLET IN 24 HOUR. 8 tablet 3   sucralfate (CARAFATE) 1 GM/10ML suspension Take by mouth.     triamcinolone cream (KENALOG) 0.1 % Apply 1 application topically 2 (two) times daily as needed (itching/rash).     venlafaxine XR (EFFEXOR-XR) 37.5 MG 24 hr capsule TAKE 1 CAPSULE BY MOUTH EVERY DAY 90 capsule 0   Vitamin D-Vitamin K (VITAMIN K2-VITAMIN D3 PO) Take 5,000 Units by mouth daily.     zolpidem (AMBIEN CR) 12.5 MG CR tablet TAKE 1 TABLET BY MOUTH AT BEDTIME AS NEEDED FOR SLEEP 30 tablet 5   zonisamide (ZONEGRAN) 100 MG capsule Take 1 capsule (100 mg total) by mouth daily. 90 capsule 3   Atogepant (QULIPTA) 60 MG TABS Take 1 tablet by mouth daily.     Cholecalciferol (VITAMIN D3) 50 MCG (2000 UT) TABS Take 2,000 Units by mouth daily.     Facility-Administered Medications Prior to Visit  Medication Dose Route Frequency Provider Last Rate Last Admin   sodium chloride flush (NS) 0.9 % injection 10 mL  10 mL Intravenous PRN Maisie Fus, MD   10 mL at 07/17/21 0820    PAST MEDICAL HISTORY: 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   Alopecia    also with female pattern hair loss   Anxiety    Depression    GERD (gastroesophageal reflux disease)    Headache(784.0)    IBS  (irritable bowel syndrome)    Migraines    PFO (patent foramen ovale)    Shingles    09/2022   Stroke    Uterine fibroids affecting pregnancy     PAST SURGICAL HISTORY: Past Surgical History:  Procedure Laterality Date   Bilateral Inferior tubinate reduction  1998   Epicondylitis Right 05/2009   with ulnar nerve decompression    TONSILLECTOMY      FAMILY HISTORY:  Family History  Problem Relation Age of Onset   High blood pressure Mother    Hyperlipidemia Mother    Lung cancer Mother    Gout Father    High blood pressure Father    Diabetes Mellitus I Sister    Other Sister        seizure disorder   Breast cancer Sister        1/2 sister (different fathers)   Alzheimer's disease Maternal Grandmother    Parkinson's disease Maternal Grandfather    Cancer Paternal Grandmother        colon   Kidney failure Other     SOCIAL HISTORY: Social History   Socioeconomic History   Marital status: Single    Spouse name: Not on file   Number of children: 0   Years of education: 2 Masters   Highest education level: Not on file  Occupational History   Occupation: PHYSICIAN ASSISTANT    Employer: [employer]  Tobacco Use   Smoking status: Never    Passive exposure: Never   Smokeless tobacco: Never  Substance and Sexual Activity   Alcohol use: Yes    Comment: rare   Drug use: No   Sexual activity: Not Currently    Partners: Male    Birth control/protection: Post-menopausal    Comment: menarche 62yo, sexual debut 62yo  Other Topics Concern   Not on file  Social History Narrative   Patient is single no children, right handed,    Education level is 2 Master's degree   Caffeine consumption is 1 cup/day of tea   Social Determinants of Corporate investment banker Strain: Not on file  Food Insecurity: Not on file  Transportation Needs: Not on file  Physical Activity: Not on file  Stress: Not on file  Social Connections: Not on file  Intimate  Partner Violence: Not on file      PHYSICAL EXAM  Vitals:   11/27/22 0749  BP: 128/83  Pulse: 73  Weight: 126 lb 12.8 oz (57.5 kg)  Height: 5' 3.6" (1.615 m)   Body mass index is 22.04 kg/m.  Generalized: Well developed, in no acute distress   Neurological examination  Mentation: Alert oriented to time, place, history taking. Follows all commands speech and language fluent Cranial nerve II-XII: Pupils were equal round reactive to light. Extraocular movements were full, visual field were full on confrontational test. Facial sensation and strength were normal. Head turning and shoulder shrug  were normal and symmetric. Motor: The motor testing reveals 5 over 5 strength of all 4 extremities. Good symmetric motor tone is noted throughout.  Sensory: Sensory testing is intact to soft touch on all 4 extremities. No evidence of extinction is noted.  Coordination: Cerebellar testing reveals good finger-nose-finger and heel-to-shin bilaterally.  Gait and station: Gait is normal.    DIAGNOSTIC DATA (LABS, IMAGING, TESTING) - I reviewed patient records, labs, notes, testing and imaging myself where available.  Lab Results  Component Value Date   WBC 5.5 06/10/2021   HGB 11.4 (L) 06/10/2021   HCT 34.2 (L) 06/10/2021   MCV 90.7 06/10/2021   PLT 251 06/10/2021      Component Value Date/Time   NA 140 06/10/2021 0504   K 3.3 (L) 06/10/2021 0504   CL 110 06/10/2021 0504   CO2 21 (L) 06/10/2021 0504   GLUCOSE 167 (H) 06/10/2021 0504   BUN 26 (H) 06/10/2021 0504   CREATININE 1.00 06/10/2021 0504   CALCIUM 9.0  06/10/2021 0504   PROT 5.9 (L) 06/10/2021 0504   ALBUMIN 3.7 06/10/2021 0504   AST 19 06/10/2021 0504   ALT 16 06/10/2021 0504   ALKPHOS 46 06/10/2021 0504   BILITOT 0.8 06/10/2021 0504   GFRNONAA >60 06/10/2021 0504   GFRAA >90 02/18/2014 2130    Lab Results  Component Value Date   TSH 3.228 06/10/2021      ASSESSMENT AND PLAN 62 y.o. year old female  has a past  medical history of Abnormal Pap smear, Alopecia, Anxiety, Depression, GERD (gastroesophageal reflux disease), Headache(784.0), IBS (irritable bowel syndrome), Migraines, PFO (patent foramen ovale), Shingles, Stroke, and Uterine fibroids affecting pregnancy. here with :  1.  Migraine headaches  -Start Qulipta 60 mg daily -Continue Zonegran 100 mg daily -Continue Nurtec for abortive therapy -Discussed doing a Toradol injection today but she deferred  2.  Insomnia  -Continue Ambien CR 12.5 mg at bedtime as needed   Follow-up in 1 year or sooner if needed     Butch Penny, MSN, NP-C 11/27/2022, 7:55 AM [facility] [address] [place], [place] [address] [phone]

## 2022-11-27 NOTE — Telephone Encounter (Signed)
Pt called stating that she took her Migraine medication and it did not help and is actually feeling worse. Pt would like to know if she can come in for a Toradol shot. Please advise.

## 2022-12-11 ENCOUNTER — Telehealth: Payer: Self-pay | Admitting: Adult Health

## 2022-12-11 ENCOUNTER — Encounter: Payer: Self-pay | Admitting: Adult Health

## 2022-12-11 NOTE — Telephone Encounter (Signed)
Pt stated she needs PA for Atogepant (QULIPTA) 60 MG TABS.

## 2022-12-12 NOTE — Telephone Encounter (Signed)
Qulipta PA completed on [facility]. Key: [id number]. Received immediate approval from [employer].

## 2022-12-30 ENCOUNTER — Other Ambulatory Visit: Payer: Self-pay | Admitting: Adult Health

## 2023-02-04 ENCOUNTER — Other Ambulatory Visit: Payer: Self-pay | Admitting: Adult Health

## 2023-02-04 ENCOUNTER — Encounter: Payer: Self-pay | Admitting: Adult Health

## 2023-02-05 ENCOUNTER — Other Ambulatory Visit: Payer: Self-pay | Admitting: Adult Health

## 2023-02-05 NOTE — Telephone Encounter (Signed)
Refills of Venlafaxine & Nurtec have been approved (we had received both requests from pharmacy).

## 2023-02-05 NOTE — Telephone Encounter (Signed)
That's fine

## 2023-04-09 ENCOUNTER — Other Ambulatory Visit: Payer: Self-pay | Admitting: Adult Health

## 2023-04-10 ENCOUNTER — Encounter: Payer: Self-pay | Admitting: Adult Health

## 2023-04-10 DIAGNOSIS — F5104 Psychophysiologic insomnia: Secondary | ICD-10-CM

## 2023-04-12 MED ORDER — ZOLPIDEM TARTRATE ER 12.5 MG PO TBCR
12.5000 mg | EXTENDED_RELEASE_TABLET | Freq: Every evening | ORAL | 5 refills | Status: DC | PRN
Start: 2023-04-12 — End: 2023-04-22

## 2023-04-22 NOTE — Addendum Note (Signed)
Addended by: Raynald Kemp A on: 04/22/2023 04:06 PM   Modules accepted: Orders

## 2023-04-23 ENCOUNTER — Telehealth: Payer: Self-pay | Admitting: Adult Health

## 2023-04-23 MED ORDER — ZOLPIDEM TARTRATE ER 12.5 MG PO TBCR: 12.5000 mg | EXTENDED_RELEASE_TABLET | Freq: Every evening | ORAL | 5 refills | Status: DC | PRN

## 2023-04-23 NOTE — Telephone Encounter (Signed)
REQUIRED PHONE NOTE:at 10:08 pt left vm checking on refill request status for zolpidem (AMBIEN CR) 12.5 MG CR tablet

## 2023-04-30 ENCOUNTER — Ambulatory Visit (INDEPENDENT_AMBULATORY_CARE_PROVIDER_SITE_OTHER): Payer: Commercial Managed Care - PPO | Admitting: Podiatry

## 2023-04-30 ENCOUNTER — Encounter: Payer: Self-pay | Admitting: Podiatry

## 2023-04-30 ENCOUNTER — Ambulatory Visit (INDEPENDENT_AMBULATORY_CARE_PROVIDER_SITE_OTHER): Payer: Commercial Managed Care - PPO

## 2023-04-30 DIAGNOSIS — M722 Plantar fascial fibromatosis: Secondary | ICD-10-CM

## 2023-04-30 MED ORDER — TRIAMCINOLONE ACETONIDE 40 MG/ML IJ SUSP
20.0000 mg | Freq: Once | INTRAMUSCULAR | Status: AC
Start: 2023-04-30 — End: 2023-04-30
  Administered 2023-04-30: 20 mg

## 2023-04-30 NOTE — Patient Instructions (Signed)

## 2023-04-30 NOTE — Progress Notes (Signed)
Subjective:  Patient ID: Vicki Wilson, female    DOB: 07/06/1961,  MRN: [id number] HPI Chief Complaint  Patient presents with   Foot Pain    Plantar heel left - aching x 3 months, AM pain, wears Shon Baton sneakers, tried gel insoles-helps, also using ice few times a day and Ibuprofen-no better  5th metatarsal right - intermittent pain, concerned she might have a stress fracture   New Patient (Initial Visit)    62 y.o. female presents with the above complaint.   ROS: Denies fever chills nausea vomiting muscle aches pains calf pain back pain chest pain shortness of breath.  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   Alopecia    also with female pattern hair loss   Anxiety    Depression    GERD (gastroesophageal reflux disease)    Headache(784.0)    IBS (irritable bowel syndrome)    Migraines    PFO (patent foramen ovale)    Shingles    09/2022   Stroke [facility])    Uterine fibroids affecting pregnancy    Past Surgical History:  Procedure Laterality Date   Bilateral Inferior tubinate reduction  1998   Epicondylitis Right 05/2009   with ulnar nerve decompression    TONSILLECTOMY      Current Outpatient Medications:    acetaminophen (TYLENOL) 500 MG tablet, Take 1,000 mg by mouth every 8 (eight) hours as needed for moderate pain or headache., Disp: , Rfl:    Aspirin-Acetaminophen-Caffeine (EXCEDRIN MIGRAINE PO), as needed., Disp: , Rfl:    Atogepant (QULIPTA) 60 MG TABS, TAKE 60 MG BY MOUTH DAILY., Disp: 30 tablet, Rfl: 5   cetirizine (ZYRTEC) 10 MG tablet, Take 10 mg by mouth daily as needed for allergies., Disp: , Rfl:    clobetasol ointment (TEMOVATE) 0.05 %, Apply a thin layer on scalp (vertex) at night time 3 times / week, Disp: , Rfl:    cyclobenzaprine (FLEXERIL) 5 MG tablet, Take 1 tablet (5 mg total) by mouth at bedtime as needed. (Patient taking differently: Take 2.5-5 mg by mouth at bedtime as needed for muscle  spasms.), Disp: 30 tablet, Rfl: 0   finasteride (PROSCAR) 5 MG tablet, Take 2.5 mg by mouth daily., Disp: , Rfl:    Fluocinolone Acetonide Scalp 0.01 % OIL, Apply 1 application topically See admin instructions. 2-3 times per week, Disp: , Rfl:    fluticasone (FLONASE) 50 MCG/ACT nasal spray, Place 2 sprays into the nose daily as needed for allergies., Disp: , Rfl:    ketorolac (TORADOL) 10 MG tablet, Take 1 tablet (10 mg total) by mouth every 6 (six) hours as needed. (Patient taking differently: Take 10 mg by mouth every 6 (six) hours as needed (migraine).), Disp: 10 tablet, Rfl: 5   LINZESS 72 MCG capsule, Take 72 mcg by mouth daily., Disp: , Rfl:    MAGNESIUM PO, Take 500 mg by mouth at bedtime., Disp: , Rfl:    Melatonin 10 MG TABS, Take 10 mg by mouth at bedtime., Disp: , Rfl:    olopatadine (PATANOL) 0.1 % ophthalmic solution, Apply to eye., Disp: , Rfl:    omeprazole (PRILOSEC) 40 MG capsule, Take 40 mg by mouth daily. , Disp: , Rfl: 11   Probiotic Product (PROBIOTIC PO), Take 1 capsule by mouth at bedtime., Disp: , Rfl:    promethazine (PHENERGAN) 12.5 MG tablet, Take 1 tablet (12.5 mg total) by mouth every 6 (six) hours as  needed for nausea or vomiting., Disp: 90 tablet, Rfl: 0   Rimegepant Sulfate (NURTEC) 75 MG TBDP, TAKE 1 TABLET BY MOUTH DAILY AS NEEDED. TAKE AT THE ONSET OF MIGRAINE. ONLY 1 TABLET IN 24 HOUR., Disp: 8 tablet, Rfl: 9   sucralfate (CARAFATE) 1 GM/10ML suspension, Take by mouth., Disp: , Rfl:    triamcinolone cream (KENALOG) 0.1 %, Apply 1 application topically 2 (two) times daily as needed (itching/rash)., Disp: , Rfl:    venlafaxine XR (EFFEXOR-XR) 37.5 MG 24 hr capsule, TAKE 1 CAPSULE BY MOUTH EVERY DAY, Disp: 90 capsule, Rfl: 2   Vitamin D-Vitamin K (VITAMIN K2-VITAMIN D3 PO), Take 5,000 Units by mouth daily., Disp: , Rfl:    zolpidem (AMBIEN CR) 12.5 MG CR tablet, Take 1 tablet (12.5 mg total) by mouth at bedtime as needed. for sleep, Disp: 30 tablet, Rfl: 5    zonisamide (ZONEGRAN) 100 MG capsule, TAKE 1 CAPSULE BY MOUTH EVERY DAY, Disp: 90 capsule, Rfl: 3 No current facility-administered medications for this visit.  Facility-Administered Medications Ordered in Other Visits:    sodium chloride flush (NS) 0.9 % injection 10 mL, 10 mL, Intravenous, PRN, Maisie Fus, MD, 10 mL at 07/17/21 0820  Allergies  Allergen Reactions   Erythromycin     GI upset   Meloxicam Other (See Comments) and Nausea Only   Triptans     myalgias   Review of Systems Objective:  There were no vitals filed for this visit.  General: Well developed, nourished, in no acute distress, alert and oriented x3   Dermatological: Skin is warm, dry and supple bilateral. Nails x 10 are well maintained; remaining integument appears unremarkable at this time. There are no open sores, no preulcerative lesions, no rash or signs of infection present.  Vascular: Dorsalis Pedis artery and Posterior Tibial artery pedal pulses are 2/4 bilateral with immedate capillary fill time. Pedal hair growth present. No varicosities and no lower extremity edema present bilateral.   Neruologic: Grossly intact via light touch bilateral. Vibratory intact via tuning fork bilateral. Protective threshold with Semmes Wienstein monofilament intact to all pedal sites bilateral. Patellar and Achilles deep tendon reflexes 2+ bilateral. No Babinski or clonus noted bilateral.   Musculoskeletal: No gross boney pedal deformities bilateral. No pain, crepitus, or limitation noted with foot and ankle range of motion bilateral. Muscular strength 5/5 in all groups tested bilateral.  Pain on palpation medial calcaneal tubercle of the left heel.  Mild tailor's bunion deformity right.  Mild hallux valgus deformities.  Gait: Unassisted, Nonantalgic.    Radiographs:  Radiographs taken today demonstrate osseously mature individual good bone mineralization.  Mild tailor's bunion deformity right as opposed to the left.  Soft  tissue increase in density plantar fashion calcaneal insertion site left.  Assessment & Plan:   Assessment: Planter fasciitis left foot.  Plan: Injected left heel today 20 mg Kenalog from milligrams Marcaine.  Placed on plantar fascia brace discussed appropriate shoe gear stretching exercise ice therapy sugar modifications.     Shianna Bally T. [place], [place]

## 2023-05-02 ENCOUNTER — Telehealth: Payer: Self-pay

## 2023-05-02 NOTE — Telephone Encounter (Signed)
Received FMLA form from [facility], received 04/25/23. Reached out to patient and form fee has been paid. Patient would like a copy of form mailed to them once completed. Placed in POD 4 for completion.

## 2023-05-06 DIAGNOSIS — Z0289 Encounter for other administrative examinations: Secondary | ICD-10-CM

## 2023-05-06 NOTE — Telephone Encounter (Signed)
FMLA form completed, signed and sent to medical records for processing.

## 2023-05-07 ENCOUNTER — Telehealth: Payer: Self-pay | Admitting: *Deleted

## 2023-05-07 NOTE — Telephone Encounter (Signed)
Pt fmla form faxed on 05/07/23

## 2023-06-18 ENCOUNTER — Telehealth: Payer: Self-pay | Admitting: Pharmacy Technician

## 2023-06-18 ENCOUNTER — Ambulatory Visit: Payer: Commercial Managed Care - PPO | Admitting: Podiatry

## 2023-06-18 ENCOUNTER — Other Ambulatory Visit (HOSPITAL_COMMUNITY): Payer: Self-pay

## 2023-06-18 NOTE — Telephone Encounter (Signed)
Pharmacy Patient Advocate Encounter  Received notification from [employer] that Prior Authorization for Nurtec 75MG  dispersible tablets has been APPROVED from 06/18/2023 to 06/16/2024   PA #/Case ID/Reference #: [id number] Key: [id number]

## 2023-08-15 ENCOUNTER — Other Ambulatory Visit: Payer: Self-pay | Admitting: Radiology

## 2023-08-15 MED ORDER — ESTRADIOL 0.1 MG/GM VA CREA: 1.0000 g | TOPICAL_CREAM | VAGINAL | 1 refills | Status: DC

## 2023-09-03 ENCOUNTER — Other Ambulatory Visit: Payer: Self-pay | Admitting: Adult Health

## 2023-09-09 ENCOUNTER — Other Ambulatory Visit: Payer: Self-pay | Admitting: Radiology

## 2023-09-09 DIAGNOSIS — Z1231 Encounter for screening mammogram for malignant neoplasm of breast: Secondary | ICD-10-CM

## 2023-10-12 ENCOUNTER — Encounter: Payer: Self-pay | Admitting: Adult Health

## 2023-10-12 DIAGNOSIS — F5104 Psychophysiologic insomnia: Secondary | ICD-10-CM

## 2023-10-14 ENCOUNTER — Other Ambulatory Visit: Payer: Self-pay | Admitting: *Deleted

## 2023-10-14 DIAGNOSIS — F5104 Psychophysiologic insomnia: Secondary | ICD-10-CM

## 2023-10-14 MED ORDER — ZOLPIDEM TARTRATE ER 12.5 MG PO TBCR: 12.5000 mg | EXTENDED_RELEASE_TABLET | Freq: Every evening | ORAL | 0 refills | Status: DC | PRN

## 2023-10-14 NOTE — Addendum Note (Signed)
 Addended by: Bertram Savin on: 10/14/2023 02:50 PM   Modules accepted: Orders

## 2023-10-14 NOTE — Progress Notes (Unsigned)
 error

## 2023-10-14 NOTE — Telephone Encounter (Signed)
 Current Rx expires in a few days but I did call the pharmacy and confirmed there are no refills left for patient to be able to fill. There was one refill that was previously put on hold but it expired in February. Rx request sent to MM NP to approve.   Last fills:  Last appt: 11/27/22 Next appt: 11/18/23

## 2023-10-14 NOTE — Telephone Encounter (Signed)
 Upon review, [facility] [address] [facility] supposed to get this [facility]. [facility] [place] and canceled the prescription.

## 2023-10-16 ENCOUNTER — Encounter: Payer: Self-pay | Admitting: Adult Health

## 2023-10-17 MED ORDER — QULIPTA 60 MG PO TABS: 60.0000 mg | ORAL_TABLET | Freq: Every day | ORAL | 0 refills | Status: DC

## 2023-11-13 ENCOUNTER — Other Ambulatory Visit (HOSPITAL_COMMUNITY): Payer: Self-pay

## 2023-11-13 ENCOUNTER — Telehealth: Payer: Self-pay

## 2023-11-13 NOTE — Telephone Encounter (Signed)
 Pharmacy Patient Advocate Encounter  Received notification from [employer] that Prior Authorization for Qulipta 60MG  tablets has been APPROVED from 10/13/2023 to 11/12/2024. Ran test claim, Copay is $0. This test claim was processed through [facility]- copay amounts may vary at other pharmacies due to pharmacy/plan contracts, or as the patient moves through the different stages of their insurance plan.   PA #/Case ID/Reference #: PA Case ID #: [id number]

## 2023-11-14 ENCOUNTER — Encounter: Payer: Self-pay | Admitting: Radiology

## 2023-11-14 ENCOUNTER — Ambulatory Visit (INDEPENDENT_AMBULATORY_CARE_PROVIDER_SITE_OTHER): Payer: Commercial Managed Care - PPO | Admitting: Radiology

## 2023-11-14 VITALS — BP 116/78 | HR 70 | Ht 63.5 in | Wt 132.2 lb

## 2023-11-14 DIAGNOSIS — Z1331 Encounter for screening for depression: Secondary | ICD-10-CM | POA: Diagnosis not present

## 2023-11-14 DIAGNOSIS — Z01419 Encounter for gynecological examination (general) (routine) without abnormal findings: Secondary | ICD-10-CM | POA: Diagnosis not present

## 2023-11-14 DIAGNOSIS — M8580 Other specified disorders of bone density and structure, unspecified site: Secondary | ICD-10-CM

## 2023-11-14 DIAGNOSIS — N958 Other specified menopausal and perimenopausal disorders: Secondary | ICD-10-CM

## 2023-11-14 MED ORDER — IMVEXXY MAINTENANCE PACK 10 MCG VA INST: 1.0000 | VAGINAL_INSERT | VAGINAL | 11 refills | Status: DC

## 2023-11-14 NOTE — Progress Notes (Signed)
   Vicki Wilson Vicki Wilson [id number]   History: Postmenopausal 63 y.o. presents for annual exam. Has some post coital bleeding the next day despite using vaginal estrace 3 times a week. No other gyn concerns. Up to date with labs.    Gynecologic History Postmenopausal Last Pap: 2024. Results were: normal Last mammogram: 4/24. Results were: normal Last colonoscopy: 2019 DEXA:9/23 osteopenia   Obstetric History OB History  Gravida Para Term Preterm AB Living  0 0 0 0 0 0  SAB IAB Ectopic Multiple Live Births  0 0 0 0 0       11/14/2023    8:03 AM  Depression screen PHQ 2/9  Decreased Interest 0  Down, Depressed, Hopeless 0  PHQ - 2 Score 0     The following portions of the patient's history were reviewed and updated as appropriate: allergies, current medications, past family history, past medical history, past social history, past surgical history, and problem list.  Review of Systems Pertinent items noted in HPI and remainder of comprehensive ROS otherwise negative.  Past medical history, past surgical history, family history and social history were all reviewed and documented in the EPIC chart.  Exam:  Vitals:   11/14/23 0802  BP: 116/78  Pulse: 70  SpO2: 99%  Weight: 132 lb 3.2 oz (60 kg)  Height: 5' 3.5" (1.613 m)   Body mass index is 23.05 kg/m.  General appearance:  Normal Thyroid:  Symmetrical, normal in size, without palpable masses or nodularity. Respiratory  Auscultation:  Clear without wheezing or rhonchi Cardiovascular  Auscultation:  Regular rate, without rubs, murmurs or gallops  Edema/varicosities:  Not grossly evident Abdominal  Soft,nontender, without masses, guarding or rebound.  Liver/spleen:  No organomegaly noted  Hernia:  None appreciated  Skin  Inspection:  Grossly normal Breasts: Examined lying and sitting.   Right: Without masses, retractions, nipple discharge or axillary adenopathy.   Left: Without masses, retractions, nipple  discharge or axillary adenopathy. Genitourinary   Inguinal/mons:  Normal without inguinal adenopathy  External genitalia:  Normal appearing vulva with no masses, tenderness, or lesions  BUS/Urethra/Skene's glands:  Normal  Vagina:  Normal appearing with normal color and discharge, no lesions. Atrophy: moderate   Cervix:  Normal appearing without discharge or lesions  Uterus:  Normal in size, shape and contour.  Midline and mobile, nontender  Adnexa/parametria:     Rt: Normal in size, without masses or tenderness.   Lt: Normal in size, without masses or tenderness.  Anus and perineum: Normal    [name], CMA present for exam  Assessment/Plan:   1. Well woman exam with routine gynecological exam (Primary) Pap 2027  2. Genitourinary syndrome of menopause Will switch to imvexxy to help with dryness 2 samples given  - Estradiol (IMVEXXY MAINTENANCE PACK) 10 MCG INST; Place 1 tablet vaginally 2 (two) times a week.  Dispense: 8 each; Refill: 11    Discussed SBE, colonoscopy and DEXA screening as directed. Recommend of exercise weekly, including weight bearing exercise. Encouraged the use of seatbelts and sunscreen.  Return in 1 year for annual or sooner prn.  Tanda Rockers WHNP-BC, 8:38 AM 11/14/2023

## 2023-11-14 NOTE — Patient Instructions (Signed)
 Preventive Care 16-61 Years Old, Female  Preventive care refers to lifestyle choices and visits with your health care provider that can promote health and wellness. Preventive care visits are also called wellness exams.  What can I expect for my preventive care visit?  Counseling  Your health care provider may ask you questions about your:  Medical history, including:  Past medical problems.  Family medical history.  Pregnancy history.  Current health, including:  Menstrual cycle.  Method of birth control.  Emotional well-being.  Home life and relationship well-being.  Sexual activity and sexual health.  Lifestyle, including:  Alcohol, nicotine or tobacco, and drug use.  Access to firearms.  Diet, exercise, and sleep habits.  Work and work Astronomer.  Sunscreen use.  Safety issues such as seatbelt and bike helmet use.  Physical exam  Your health care provider will check your:  Height and weight. These may be used to calculate your BMI (body mass index). BMI is a measurement that tells if you are at a healthy weight.  Waist circumference. This measures the distance around your waistline. This measurement also tells if you are at a healthy weight and may help predict your risk of certain diseases, such as type 2 diabetes and high blood pressure.  Heart rate and blood pressure.  Body temperature.  Skin for abnormal spots.  What immunizations do I need?    Vaccines are usually given at various ages, according to a schedule. Your health care provider will recommend vaccines for you based on your age, medical history, and lifestyle or other factors, such as travel or where you work.  What tests do I need?  Screening  Your health care provider may recommend screening tests for certain conditions. This may include:  Lipid and cholesterol levels.  Diabetes screening. This is done by checking your blood sugar (glucose) after you have not eaten for a while (fasting).  Pelvic exam and Pap test.  Hepatitis B test.  Hepatitis C  test.  HIV (human immunodeficiency virus) test.  STI (sexually transmitted infection) testing, if you are at risk.  Lung cancer screening.  Colorectal cancer screening.  Mammogram. Talk with your health care provider about when you should start having regular mammograms. This may depend on whether you have a family history of breast cancer.  BRCA-related cancer screening. This may be done if you have a family history of breast, ovarian, tubal, or peritoneal cancers.  Bone density scan. This is done to screen for osteoporosis.  Talk with your health care provider about your test results, treatment options, and if necessary, the need for more tests.  Follow these instructions at home:  Eating and drinking    Eat a diet that includes fresh fruits and vegetables, whole grains, lean protein, and low-fat dairy products.  Take vitamin and mineral supplements as recommended by your health care provider.  Do not drink alcohol if:  Your health care provider tells you not to drink.  You are pregnant, may be pregnant, or are planning to become pregnant.  If you drink alcohol:  Limit how much you have to 0-1 drink a day.  Know how much alcohol is in your drink. In the [place] one drink equals one 12 oz bottle of beer (355 mL), one 5 oz glass of wine (148 mL), or one 1 oz glass of hard liquor (44 mL).  Lifestyle  Brush your teeth every morning and night with fluoride toothpaste. Floss one time each day.  Exercise for at least  30 minutes 5 or more days each week.  Do not use any products that contain nicotine or tobacco. These products include cigarettes, chewing tobacco, and vaping devices, such as e-cigarettes. If you need help quitting, ask your health care provider.  Do not use drugs.  If you are sexually active, practice safe sex. Use a condom or other form of protection to prevent STIs.  If you do not wish to become pregnant, use a form of birth control. If you plan to become pregnant, see your health care provider for a  prepregnancy visit.  Take aspirin only as told by your health care provider. Make sure that you understand how much to take and what form to take. Work with your health care provider to find out whether it is safe and beneficial for you to take aspirin daily.  Find healthy ways to manage stress, such as:  Meditation, yoga, or listening to music.  Journaling.  Talking to a trusted person.  Spending time with friends and family.  Minimize exposure to UV radiation to reduce your risk of skin cancer.  Safety  Always wear your seat belt while driving or riding in a vehicle.  Do not drive:  If you have been drinking alcohol. Do not ride with someone who has been drinking.  When you are tired or distracted.  While texting.  If you have been using any mind-altering substances or drugs.  Wear a helmet and other protective equipment during sports activities.  If you have firearms in your house, make sure you follow all gun safety procedures.  Seek help if you have been physically or sexually abused.  What's next?  Visit your health care provider once a year for an annual wellness visit.  Ask your health care provider how often you should have your eyes and teeth checked.  Stay up to date on all vaccines.  This information is not intended to replace advice given to you by your health care provider. Make sure you discuss any questions you have with your health care provider.  Document Revised: 01/25/2021 Document Reviewed: 01/25/2021  Elsevier Patient Education  2024 [employer].

## 2023-11-15 ENCOUNTER — Other Ambulatory Visit: Payer: Self-pay | Admitting: Radiology

## 2023-11-15 DIAGNOSIS — N958 Other specified menopausal and perimenopausal disorders: Secondary | ICD-10-CM

## 2023-11-15 MED ORDER — IMVEXXY MAINTENANCE PACK 10 MCG VA INST: 1.0000 | VAGINAL_INSERT | VAGINAL | 11 refills | Status: AC

## 2023-11-18 ENCOUNTER — Encounter: Payer: Self-pay | Admitting: Adult Health

## 2023-11-18 ENCOUNTER — Ambulatory Visit (INDEPENDENT_AMBULATORY_CARE_PROVIDER_SITE_OTHER): Payer: Commercial Managed Care - PPO | Admitting: Adult Health

## 2023-11-18 VITALS — BP 125/85 | HR 74 | Ht 63.0 in | Wt 134.2 lb

## 2023-11-18 DIAGNOSIS — F5104 Psychophysiologic insomnia: Secondary | ICD-10-CM | POA: Diagnosis not present

## 2023-11-18 DIAGNOSIS — G43019 Migraine without aura, intractable, without status migrainosus: Secondary | ICD-10-CM | POA: Diagnosis not present

## 2023-11-18 NOTE — Progress Notes (Unsigned)
 PATIENT: Vicki Wilson DOB: 03-Mar-1961  REASON FOR VISIT: follow up HISTORY FROM: patient PRIMARY NEUROLOGIST: Dr. Vickey Huger  Chief Complaint  Patient presents with   Follow-up    Rm 19, alone. Worsening migraines. #2-6 days (stress related).  Needs refills on qulipta, cyclobnzaprine, venlafaxine, zolpidem (all a [facility] [place])     HISTORY OF PRESENT ILLNESS: Today 11/18/23:  Vicki Wilson is a 63 y.o. female with a history of Migraines and insomnia. Returns today for follow-up.  She has been taking Qulipta.  And reports initially she feels that it is working well.  She reports that there has been more stress at work therefore her headaches have picked up in the last 2 months.  Reports that she can have anywhere between 2-6 headaches a week.  She does use Nurtec but has to.  With an NSAID in order for it to work.  In the past she has had Toradol but that prescription has expired.  She would like a refill.  She states that she also has Flexeril but does not use it very often.  Reports that she will only use it when her headache radiates to her neck.  She also remains on Effexor and Zonegran.  With her migraine she denies aura.  Denies numbness and tingling in the extremities.  On her history there is report of cerebral infarct.  However the patient states that there was some conflicting reports by the radiologist.  An initial MRI was reported that she had a lacunar infarct.  However a second MRI the radiologist did reported as a lacunar infarct.  This all took place over 15 years ago in 2006.  Patient reports that she has not had any strokelike symptoms.     11/27/22: Vicki Wilson is a 63 y.o. female with a history of Migraines. Returns today for follow-up. Bennie Pierini has helped. Migraines are not as frequent and severity is better. Reports that she had a headache that started Saturday that has been there off and on since then. Tried Nurtec on Sunday but not much relief.  Usually will  take Nurtec without improvement but she did not this time.  Continues to take Ambien for insomnia.  Returns today for evaluation.  04/11/22: Vicki Wilson is a 63 year old female with a history of migraine headaches and insomnia.  She returns today for follow-up. Emgality shot was a month ago ( July 30th). Requested a refill today. Headache started Aug 7th. Reports this lasted 16 days.  Since had maybe one day of relief but the headache came back. Abortive medication nurtrec with NSAID did not help. Also tried Caffine with no benefit. Some headaches will last > 8 hours before it resolves. Sometimes she will wake up with a headache. These headaches are more severe and harder to get relief. Does not tolerate steroids well. Last time she took an NSAID was Sunday. No headache today.   On Average ( not including this last month) she has about 3 headaches a week.   HISTORY  08/29/21:   Vicki Wilson is a 63 year old female with a history of migraine headaches and insomnia.  She returns today for follow-up.  She reports that [employer], Zonegran and Effexor continues to work okay for her migraines.  She states he recently she did have an ongoing headache that kept her out of work.  She typically takes Nurtec but usually has to pair it with Tylenol or ibuprofen.  On occasion she will use magnesium as well.  She continues to use Ambien CR 12.5 mg for insomnia.  She is asking about the new medication Quviviq.  She plans to check with her insurance to see if it is approved.   Patient has a prescription of Flexeril that she uses for muscle spasms.  She states that she uses it very rarely.  Often uses it for neck pain.  REVIEW OF SYSTEMS: Out of a complete 14 system review of symptoms, the patient complains only of the following symptoms, and all other reviewed systems are negative.  ALLERGIES: Allergies  Allergen Reactions   Erythromycin     GI upset   Meloxicam Other (See Comments) and Nausea Only   Triptans      myalgias    HOME MEDICATIONS: Outpatient Medications Prior to Visit  Medication Sig Dispense Refill   acetaminophen (TYLENOL) 500 MG tablet Take 1,000 mg by mouth every 8 (eight) hours as needed for moderate pain or headache.     Aspirin-Acetaminophen-Caffeine (EXCEDRIN MIGRAINE PO) as needed.     Atogepant (QULIPTA) 60 MG TABS Take 1 tablet (60 mg total) by mouth daily. 30 tablet 0   cetirizine (ZYRTEC) 10 MG tablet Take 10 mg by mouth daily as needed for allergies.     clobetasol ointment (TEMOVATE) 0.05 % Apply a thin layer on scalp (vertex) at night time 3 times / week     cyclobenzaprine (FLEXERIL) 5 MG tablet Take 1 tablet (5 mg total) by mouth at bedtime as needed. (Patient taking differently: Take 2.5-5 mg by mouth at bedtime as needed for muscle spasms.) 30 tablet 0   Estradiol (IMVEXXY MAINTENANCE PACK) 10 MCG INST Place 1 tablet vaginally 2 (two) times a week. 8 each 11   finasteride (PROSCAR) 5 MG tablet Take 2.5 mg by mouth daily.     Fluocinolone Acetonide Scalp 0.01 % OIL Apply 1 application topically See admin instructions. 2-3 times per week     fluticasone (FLONASE) 50 MCG/ACT nasal spray Place 2 sprays into the nose daily as needed for allergies.     ketorolac (TORADOL) 10 MG tablet Take 1 tablet (10 mg total) by mouth every 6 (six) hours as needed. (Patient taking differently: Take 10 mg by mouth every 6 (six) hours as needed (migraine).) 10 tablet 5   LINZESS 72 MCG capsule Take 72 mcg by mouth daily.     MAGNESIUM PO Take 500 mg by mouth at bedtime.     Melatonin 10 MG TABS Take 10 mg by mouth at bedtime.     olopatadine (PATANOL) 0.1 % ophthalmic solution Apply to eye as needed.     omeprazole (PRILOSEC) 40 MG capsule Take 40 mg by mouth daily.   11   Probiotic Product (PROBIOTIC PO) Take 1 capsule by mouth at bedtime.     promethazine (PHENERGAN) 12.5 MG tablet Take 1 tablet (12.5 mg total) by mouth every 6 (six) hours as needed for nausea or vomiting. 90 tablet 0    Rimegepant Sulfate (NURTEC) 75 MG TBDP TAKE 1 TABLET BY MOUTH DAILY AS NEEDED. TAKE AT THE ONSET OF MIGRAINE. ONLY 1 TABLET IN 24 HOUR. 8 tablet 9   sucralfate (CARAFATE) 1 GM/10ML suspension Take by mouth.     triamcinolone cream (KENALOG) 0.1 % Apply 1 application topically 2 (two) times daily as needed (itching/rash).     venlafaxine XR (EFFEXOR-XR) 37.5 MG 24 hr capsule TAKE 1 CAPSULE BY MOUTH EVERY DAY 90 capsule 2   Vitamin D-Vitamin K (VITAMIN K2-VITAMIN D3 PO) Take 5,000  Units by mouth daily.     zolpidem (AMBIEN CR) 12.5 MG CR tablet Take 1 tablet (12.5 mg total) by mouth at bedtime as needed for sleep. 30 tablet 0   zonisamide (ZONEGRAN) 100 MG capsule TAKE 1 CAPSULE BY MOUTH EVERY DAY 90 capsule 3   Facility-Administered Medications Prior to Visit  Medication Dose Route Frequency Provider Last Rate Last Admin   sodium chloride flush (NS) 0.9 % injection 10 mL  10 mL Intravenous PRN Maisie Fus, MD   10 mL at 07/17/21 0820    PAST MEDICAL HISTORY: 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   Alopecia    also with female pattern hair loss   Anxiety    Depression    GERD (gastroesophageal reflux disease)    Headache(784.0)    IBS (irritable bowel syndrome)    Migraines    PFO (patent foramen ovale)    Shingles    09/2022   Stroke [facility])    Uterine fibroids affecting pregnancy     PAST SURGICAL HISTORY: Past Surgical History:  Procedure Laterality Date   Bilateral Inferior tubinate reduction  1998   Epicondylitis Right 05/2009   with ulnar nerve decompression    TONSILLECTOMY      FAMILY HISTORY: Family History  Problem Relation Age of Onset   High blood pressure Mother    Hyperlipidemia Mother    Lung cancer Mother    Gout Father    High blood pressure Father    Diabetes Mellitus I Sister    Other Sister        seizure disorder   Breast cancer Sister        1/2 sister (different fathers)   Alzheimer's  disease Maternal Grandmother    Parkinson's disease Maternal Grandfather    Cancer Paternal Grandmother        colon   Kidney failure Other     SOCIAL HISTORY: Social History   Socioeconomic History   Marital status: Single    Spouse name: Not on file   Number of children: 0   Years of education: 2 Masters   Highest education level: Not on file  Occupational History   Occupation: PHYSICIAN ASSISTANT    Employer: [employer]  Tobacco Use   Smoking status: Never    Passive exposure: Never   Smokeless tobacco: Never  Substance and Sexual Activity   Alcohol use: Yes    Comment: rare   Drug use: No   Sexual activity: Yes    Partners: Male    Birth control/protection: Post-menopausal    Comment: menarche 63yo, sexual debut 63yo  Other Topics Concern   Not on file  Social History Narrative   Patient is single no children, right handed,    Education level is 2 Master's degree   Caffeine consumption is 1 cup/day of tea   Social Drivers of Corporate investment banker Strain: Not on file  Food Insecurity: Not on file  Transportation Needs: Not on file  Physical Activity: Not on file  Stress: Not on file  Social Connections: Not on file  Intimate Partner Violence: Not on file      PHYSICAL EXAM  Vitals:   11/18/23 0815  BP: 125/85  Pulse: 74  Weight: 134 lb 3.2 oz (60.9 kg)  Height: 5\' 3"  (1.6 m)   Body mass index is 23.77 kg/m.  Generalized: Well developed, in no acute  distress   Neurological examination  Mentation: Alert oriented to time, place, history taking. Follows all commands speech and language fluent Cranial nerve II-XII: Pupils were equal round reactive to light. Extraocular movements were full, visual field were full on confrontational test. Facial sensation and strength were normal. Head turning and shoulder shrug  were normal and symmetric. Motor: The motor testing reveals 5 over 5 strength of all 4 extremities. Good  symmetric motor tone is noted throughout.  Sensory: Sensory testing is intact to soft touch on all 4 extremities. No evidence of extinction is noted.  Coordination: Cerebellar testing reveals good finger-nose-finger and heel-to-shin bilaterally.  Gait and station: Gait is normal.    DIAGNOSTIC DATA (LABS, IMAGING, TESTING) - I reviewed patient records, labs, notes, testing and imaging myself where available.  Lab Results  Component Value Date   WBC 5.5 06/10/2021   HGB 11.4 (L) 06/10/2021   HCT 34.2 (L) 06/10/2021   MCV 90.7 06/10/2021   PLT 251 06/10/2021      Component Value Date/Time   NA 140 06/10/2021 0504   K 3.3 (L) 06/10/2021 0504   CL 110 06/10/2021 0504   CO2 21 (L) 06/10/2021 0504   GLUCOSE 167 (H) 06/10/2021 0504   BUN 26 (H) 06/10/2021 0504   CREATININE 1.00 06/10/2021 0504   CALCIUM 9.0 06/10/2021 0504   PROT 5.9 (L) 06/10/2021 0504   ALBUMIN 3.7 06/10/2021 0504   AST 19 06/10/2021 0504   ALT 16 06/10/2021 0504   ALKPHOS 46 06/10/2021 0504   BILITOT 0.8 06/10/2021 0504   GFRNONAA >60 06/10/2021 0504   GFRAA >90 02/18/2014 2130    Lab Results  Component Value Date   TSH 3.228 06/10/2021      ASSESSMENT AND PLAN 63 y.o. year old female  has a past medical history of Abnormal Pap smear, Alopecia, Anxiety, Depression, GERD (gastroesophageal reflux disease), Headache(784.0), IBS (irritable bowel syndrome), Migraines, PFO (patent foramen ovale), Shingles, Stroke (HCC), and Uterine fibroids affecting pregnancy. here with :  1.  Migraine headaches  - Continue Qulipta 60 mg daily -Continue Zonegran 100 mg daily -Continue Effexor 37.5 mg daily -Continue Nurtec for abortive therapy -Continue Flexeril 5 mg at bedtime as needed for muscle spasm -Okay to use Toradol 10 mg tablets -Discussed doing a Toradol injection today but she deferred  2.  Insomnia  -Continue Ambien CR 12.5 mg at bedtime as needed  I did follow-up with the patient after the visit to the  MyChart message.  In reading through the notes from several years ago Dr. Vickey Huger had wanted her to see a sleep psychologist.  Per the notes she did not want to continue refilling Ambien until she did see a sleep psychologist.  Per the MyChart message the patient has seen a sleep psychologist and has gone to counseling as well.  Reports that she was told by the sleep psychologist that she recommended continuing Ambien.  I advised patient tin MyChart message that I would refill her Ambien.  I would also make Dr. Vickey Huger aware.  I also cautioned the patient that taking Flexeril with Ambien can have CNS effects.  Patient assured me that she takes them separate.  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   Follow-up in 1 year or sooner if needed     Butch Penny, MSN, NP-C 11/18/2023, 8:33 AM [facility] [address] [place], [place] [address] [phone]

## 2023-11-19 NOTE — Telephone Encounter (Signed)
 I saw this patient yesterday.  I sent her the following MyChart message but does not appear that she has read it yet.  Can someone please call the patient and get the answers please.

## 2023-11-20 ENCOUNTER — Other Ambulatory Visit: Payer: Self-pay | Admitting: Adult Health

## 2023-11-20 MED ORDER — ZONISAMIDE 100 MG PO CAPS: 100.0000 mg | ORAL_CAPSULE | Freq: Every day | ORAL | 3 refills | Status: AC

## 2023-11-20 MED ORDER — VENLAFAXINE HCL ER 37.5 MG PO CP24: ORAL_CAPSULE | ORAL | 3 refills | Status: AC

## 2023-11-20 MED ORDER — CYCLOBENZAPRINE HCL 5 MG PO TABS: 5.0000 mg | ORAL_TABLET | Freq: Every evening | ORAL | 0 refills | Status: AC | PRN

## 2023-11-20 MED ORDER — KETOROLAC TROMETHAMINE 10 MG PO TABS: 10.0000 mg | ORAL_TABLET | Freq: Three times a day (TID) | ORAL | 1 refills | Status: AC | PRN

## 2023-11-20 MED ORDER — ZOLPIDEM TARTRATE ER 12.5 MG PO TBCR: 12.5000 mg | EXTENDED_RELEASE_TABLET | Freq: Every evening | ORAL | 0 refills | Status: DC | PRN

## 2023-11-20 MED ORDER — NURTEC 75 MG PO TBDP: ORAL_TABLET | ORAL | 11 refills | Status: AC

## 2023-11-21 MED ORDER — QULIPTA 60 MG PO TABS: 60.0000 mg | ORAL_TABLET | Freq: Every day | ORAL | 11 refills | Status: AC

## 2023-11-28 ENCOUNTER — Ambulatory Visit: Payer: Commercial Managed Care - PPO | Admitting: Adult Health

## 2023-11-29 ENCOUNTER — Ambulatory Visit
Admission: RE | Admit: 2023-11-29 | Discharge: 2023-11-29 | Disposition: A | Discharge status: Home / Self Care | Payer: Commercial Managed Care - PPO | Source: Ambulatory Visit | Attending: Radiology | Admitting: Radiology

## 2023-11-29 DIAGNOSIS — Z1231 Encounter for screening mammogram for malignant neoplasm of breast: Secondary | ICD-10-CM

## 2023-12-15 ENCOUNTER — Other Ambulatory Visit: Payer: Self-pay | Admitting: Adult Health

## 2023-12-15 DIAGNOSIS — F5104 Psychophysiologic insomnia: Secondary | ICD-10-CM

## 2023-12-17 MED ORDER — ZOLPIDEM TARTRATE ER 12.5 MG PO TBCR: 12.5000 mg | EXTENDED_RELEASE_TABLET | Freq: Every evening | ORAL | 5 refills | Status: DC | PRN

## 2023-12-17 NOTE — Addendum Note (Signed)
 Addended by: Curry Dove on: 12/17/2023 11:02 AM   Modules accepted: Orders

## 2024-01-20 ENCOUNTER — Other Ambulatory Visit: Payer: Self-pay

## 2024-01-20 ENCOUNTER — Emergency Department (HOSPITAL_COMMUNITY)
Admission: EM | Admit: 2024-01-20 | Discharge: 2024-01-20 | Disposition: A | Discharge status: Home / Self Care | Attending: Emergency Medicine | Admitting: Emergency Medicine

## 2024-01-20 ENCOUNTER — Encounter (HOSPITAL_COMMUNITY): Payer: Self-pay | Admitting: Emergency Medicine

## 2024-01-20 DIAGNOSIS — R55 Syncope and collapse: Secondary | ICD-10-CM | POA: Insufficient documentation

## 2024-01-20 DIAGNOSIS — Z7982 Long term (current) use of aspirin: Secondary | ICD-10-CM | POA: Insufficient documentation

## 2024-01-20 DIAGNOSIS — R112 Nausea with vomiting, unspecified: Secondary | ICD-10-CM | POA: Insufficient documentation

## 2024-01-20 LAB — URINALYSIS, ROUTINE W REFLEX MICROSCOPIC
Bacteria, UA: NONE SEEN
Bilirubin Urine: NEGATIVE
Glucose, UA: NEGATIVE mg/dL
Hgb urine dipstick: NEGATIVE
Ketones, ur: NEGATIVE mg/dL
Leukocytes,Ua: NEGATIVE
Nitrite: NEGATIVE
Protein, ur: 100 mg/dL — AB
Specific Gravity, Urine: 1.019 (ref 1.005–1.030)
pH: 7 (ref 5.0–8.0)

## 2024-01-20 LAB — CBC WITH DIFFERENTIAL/PLATELET
Abs Immature Granulocytes: 0.02 10*3/uL (ref 0.00–0.07)
Basophils Absolute: 0 10*3/uL (ref 0.0–0.1)
Basophils Relative: 0 %
Eosinophils Absolute: 0 10*3/uL (ref 0.0–0.5)
Eosinophils Relative: 0 %
HCT: 36.1 % (ref 36.0–46.0)
Hemoglobin: 12.1 g/dL (ref 12.0–15.0)
Immature Granulocytes: 0 %
Lymphocytes Relative: 8 %
Lymphs Abs: 0.4 10*3/uL — ABNORMAL LOW (ref 0.7–4.0)
MCH: 30.7 pg (ref 26.0–34.0)
MCHC: 33.5 g/dL (ref 30.0–36.0)
MCV: 91.6 fL (ref 80.0–100.0)
Monocytes Absolute: 0.3 10*3/uL (ref 0.1–1.0)
Monocytes Relative: 6 %
Neutro Abs: 3.9 10*3/uL (ref 1.7–7.7)
Neutrophils Relative %: 86 %
Platelets: 180 10*3/uL (ref 150–400)
RBC: 3.94 MIL/uL (ref 3.87–5.11)
RDW: 12.4 % (ref 11.5–15.5)
WBC: 4.6 10*3/uL (ref 4.0–10.5)
nRBC: 0 % (ref 0.0–0.2)

## 2024-01-20 LAB — COMPREHENSIVE METABOLIC PANEL WITH GFR
ALT: 19 U/L (ref 0–44)
AST: 17 U/L (ref 15–41)
Albumin: 3.4 g/dL — ABNORMAL LOW (ref 3.5–5.0)
Alkaline Phosphatase: 52 U/L (ref 38–126)
Anion gap: 9 (ref 5–15)
BUN: 17 mg/dL (ref 6–20)
CO2: 23 mmol/L (ref 22–32)
Calcium: 8.3 mg/dL — ABNORMAL LOW (ref 8.9–10.3)
Chloride: 104 mmol/L (ref 98–111)
Creatinine, Ser: 0.77 mg/dL (ref 0.44–1.00)
GFR, Estimated: >60 mL/min (ref >60)
Glucose, Bld: 106 mg/dL — ABNORMAL HIGH (ref 70–99)
Potassium: 3.8 mmol/L (ref 3.5–5.1)
Sodium: 136 mmol/L (ref 135–145)
Total Bilirubin: 0.6 mg/dL (ref 0.0–1.2)
Total Protein: 6.1 g/dL — ABNORMAL LOW (ref 6.5–8.1)

## 2024-01-20 LAB — LIPASE, BLOOD: Lipase: 25 U/L (ref 11–51)

## 2024-01-20 MED ORDER — SUCRALFATE 1 GM/10ML PO SUSP: 1.0000 g | Freq: Three times a day (TID) | ORAL | 0 refills | Status: AC

## 2024-01-20 MED ORDER — SODIUM CHLORIDE 0.9 % IV BOLUS
1000.0000 mL | Freq: Once | INTRAVENOUS | Status: AC
  Administered 2024-01-20: 1000 mL via INTRAVENOUS

## 2024-01-20 MED ORDER — ONDANSETRON HCL 4 MG PO TABS: 4.0000 mg | ORAL_TABLET | ORAL | 0 refills | Status: AC | PRN

## 2024-01-20 NOTE — ED Provider Notes (Signed)
 [place] EMERGENCY DEPARTMENT AT [facility] Provider Note  CSN: [id number] Arrival date & time: 01/20/24 0940  Chief Complaint(s) Emesis  HPI Vicki Wilson is a 63 y.o. female with past medical history as below, significant for GERD, IBS, depression, fibromyalgia who presents to the ED with complaint of nausea, vomiting, diarrhea, near syncope  Patient reports she was having some abdominal cramping yesterday, had some diarrhea, nonbloody, nonmelanotic.  She is having abdominal cramping overnight.  When she woke up this morning she was feeling unwell, felt nauseated still having cramping.  She went to work and had vomiting and diarrhea.  Nonbloody nonbilious, diarrhea loose nonmelanotic, nonbloody.  She felt diaphoretic, like she was going to have LOC.  She sat on the floor, EMS was dispatched.  Blood pressure was low on EMS arrival.  She was given fluid by EMS and brought to the ER.  Upon arrival here she is feeling much better.  Abdominal pain has resolved.  No longer having nausea.  Blood pressure has improved.  Past Medical History 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   Alopecia    also with female pattern hair loss   Anxiety    Depression    GERD (gastroesophageal reflux disease)    Headache(784.0)    IBS (irritable bowel syndrome)    Migraines    PFO (patent foramen ovale)    Shingles    09/2022   Stroke [facility])    Uterine fibroids affecting pregnancy    Patient Active Problem List   Diagnosis Date Noted   Depressive disorder 09/25/2018   Fibromyalgia 09/25/2018   At high risk for open angle glaucoma of both eyes 12/18/2017   Intractable chronic migraine without aura and with status migrainosus 07/23/2016   Hypermetropia of both eyes 11/15/2015   Keratoconjunctivitis sicca of both eyes not specified as Sjogren's 11/15/2015   Open angle with borderline findings and high glaucoma risk in left eye  11/15/2015   Chronic allergic conjunctivitis 11/15/2015   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 infarction and with status migrainosus 12/09/2014   Insomnia due to stress 12/09/2014   Insomnia disorder, with non-sleep disorder mental comorbidity, episodic 08/19/2014   Migraines    Fibroids 11/17/2013   Abnormal CPK 01/08/2013   Dizziness 01/08/2013   Photophobia 01/08/2013   Muscle pain 01/08/2013   Other adrenal hypofunction 01/08/2013   Migraine without aura 01/08/2013   Cerebral thrombosis with cerebral infarction (HCC) 01/08/2013   Ostium secundum type atrial septal defect 01/08/2013   Gait abnormality 12/20/2011   Foot pain 11/21/2011   Home Medication(s) Prior to Admission medications   Medication Sig Start Date End Date Taking? Authorizing Provider  acetaminophen (TYLENOL) 500 MG tablet Take 1,000 mg by mouth every 8 (eight) hours as needed for moderate pain or headache.    [provider]  Aspirin-Acetaminophen-Caffeine (EXCEDRIN MIGRAINE PO) as needed.    [provider]  Atogepant  (QULIPTA ) 60 MG TABS Take 1 tablet (60 mg total) by mouth daily. 11/21/23   Millikan, Megan, NP  cetirizine (ZYRTEC) 10 MG tablet Take 10 mg by mouth daily as needed for allergies.    [provider]  clobetasol ointment (TEMOVATE) 0.05 % Apply a thin layer on scalp (vertex) at night time 3 times / week 06/15/21   [provider]  cyclobenzaprine  (FLEXERIL ) 5 MG tablet Take 1 tablet (5 mg total) by mouth at bedtime as needed. Not be  to taken with Ambien  11/20/23   Clem Currier, NP  Estradiol  (IMVEXXY  MAINTENANCE PACK) 10 MCG INST Place 1 tablet vaginally 2 (two) times a week. 11/18/23   Chrzanowski, Jami B, NP  finasteride (PROSCAR) 5 MG tablet Take 2.5 mg by mouth daily. 06/08/21   [provider]  Fluocinolone Acetonide Scalp 0.01 % OIL Apply 1 application topically See admin instructions. 2-3 times per week 12/22/20   [provider]   fluticasone (FLONASE) 50 MCG/ACT nasal spray Place 2 sprays into the nose daily as needed for allergies.    [provider]  ketorolac  (TORADOL ) 10 MG tablet Take 1 tablet (10 mg total) by mouth every 8 (eight) hours as needed for severe pain (pain score 7-10). 11/20/23   Clem Currier, NP  LINZESS 72 MCG capsule Take 72 mcg by mouth daily. 09/10/20   [provider]  MAGNESIUM PO Take 500 mg by mouth at bedtime.    [provider]  Melatonin 10 MG TABS Take 10 mg by mouth at bedtime.    [provider]  olopatadine (PATANOL) 0.1 % ophthalmic solution Apply to eye as needed. 12/06/17   [provider]  omeprazole (PRILOSEC) 40 MG capsule Take 40 mg by mouth daily.  07/09/15   [provider]  Probiotic Product (PROBIOTIC PO) Take 1 capsule by mouth at bedtime.    [provider]  promethazine  (PHENERGAN ) 12.5 MG tablet Take 1 tablet (12.5 mg total) by mouth every 6 (six) hours as needed for nausea or vomiting. 07/23/16   Dohmeier, Raoul Byes, MD  Rimegepant Sulfate (NURTEC) 75 MG TBDP TAKE 1 TABLET BY MOUTH DAILY AS NEEDED. TAKE AT THE ONSET OF MIGRAINE. ONLY 1 TABLET IN 24 HOUR. 11/20/23   Millikan, Megan, NP  sucralfate (CARAFATE) 1 GM/10ML suspension Take by mouth. 12/04/12   [provider]  triamcinolone  cream (KENALOG ) 0.1 % Apply 1 application topically 2 (two) times daily as needed (itching/rash). 10/23/13   [provider]  venlafaxine  XR (EFFEXOR -XR) 37.5 MG 24 hr capsule TAKE 1 CAPSULE BY MOUTH EVERY DAY 11/20/23   Clem Currier, NP  Vitamin D-Vitamin K (VITAMIN K2-VITAMIN D3 PO) Take 5,000 Units by mouth daily.    [provider]  zolpidem  (AMBIEN  CR) 12.5 MG CR tablet Take 1 tablet (12.5 mg total) by mouth at bedtime as needed for sleep. 12/17/23   Millikan, Megan, NP  zonisamide  (ZONEGRAN ) 100 MG capsule Take 1 capsule (100 mg total) by mouth daily. TAKE 1 CAPSULE BY MOUTH EVERY DAY 11/20/23   Clem Currier,  NP                                                                                                                                    Past Surgical History Past Surgical History:  Procedure Laterality Date   Bilateral Inferior tubinate reduction  1998   Epicondylitis Right 05/2009   with ulnar nerve decompression  TONSILLECTOMY     Family History Family History  Problem Relation Age of Onset   High blood pressure Mother    Hyperlipidemia Mother    Lung cancer Mother    Gout Father    High blood pressure Father    Diabetes Mellitus I Sister    Other Sister        seizure disorder   Breast cancer Sister        1/2 sister (different fathers)   Alzheimer's disease Maternal Grandmother    Parkinson's disease Maternal Grandfather    Cancer Paternal Grandmother        colon   Kidney failure Other     Social History Social History   Tobacco Use   Smoking status: Never    Passive exposure: Never   Smokeless tobacco: Never  Substance Use Topics   Alcohol use: Yes    Comment: rare   Drug use: No   Allergies Erythromycin, Meloxicam, and Triptans  Review of Systems A thorough review of systems was obtained and all systems are negative except as noted in the HPI and PMH.   Physical Exam Vital Signs  I have reviewed the triage vital signs BP (!) 140/90   Pulse 86   Temp 97.8 F (36.6 C) (Oral)   Resp 17   Ht 5\' 3"  (1.6 m)   Wt 60.8 kg   LMP 08/28/2013 (Exact Date)   SpO2 100%   BMI 23.74 kg/m  Physical Exam Vitals and nursing note reviewed.  Constitutional:      General: She is not in acute distress.    Appearance: Normal appearance.  HENT:     Head: Normocephalic and atraumatic.     Right Ear: External ear normal.     Left Ear: External ear normal.     Nose: Nose normal.     Mouth/Throat:     Mouth: Mucous membranes are dry.  Eyes:     General: No scleral icterus.       Right eye: No discharge.        Left eye: No discharge.  Cardiovascular:     Rate  and Rhythm: Normal rate and regular rhythm.     Pulses: Normal pulses.     Heart sounds: Normal heart sounds.  Pulmonary:     Effort: Pulmonary effort is normal. No respiratory distress.     Breath sounds: Normal breath sounds. No stridor.  Abdominal:     General: Abdomen is flat. There is no distension.     Palpations: Abdomen is soft.     Tenderness: There is no abdominal tenderness.  Musculoskeletal:     Cervical back: No rigidity.     Right lower leg: No edema.     Left lower leg: No edema.  Skin:    General: Skin is warm and dry.     Capillary Refill: Capillary refill takes less than 2 seconds.  Neurological:     Mental Status: She is alert.  Psychiatric:        Mood and Affect: Mood normal.        Behavior: Behavior normal. Behavior is cooperative.     ED Results and Treatments Labs (all labs ordered are listed, but only abnormal results are displayed) Labs Reviewed  CBC WITH DIFFERENTIAL/PLATELET - Abnormal; Notable for the following components:      Result Value   Lymphs Abs 0.4 (*)    All other components within normal limits  COMPREHENSIVE METABOLIC PANEL WITH GFR - Abnormal;  Notable for the following components:   Glucose, Bld 106 (*)    Calcium 8.3 (*)    Total Protein 6.1 (*)    Albumin 3.4 (*)    All other components within normal limits  URINALYSIS, ROUTINE W REFLEX MICROSCOPIC - Abnormal; Notable for the following components:   Protein, ur 100 (*)    All other components within normal limits  LIPASE, BLOOD                                                                                                                          Radiology No results found.  Pertinent labs & imaging results that were available during my care of the patient were reviewed by me and considered in my medical decision making (see MDM for details).  Medications Ordered in ED Medications  sodium chloride  0.9 % bolus 1,000 mL (1,000 mLs Intravenous Bolus 01/20/24 1059)                                                                                                                                      Procedures Procedures  (including critical care time)  Medical Decision Making / ED Course    Medical Decision Making:    LATECIA MILER is a 63 y.o. female with past medical history as below, significant for GERD, IBS, depression, fibromyalgia who presents to the ED with complaint of nausea, vomiting, diarrhea, near syncope. The complaint involves an extensive differential diagnosis and also carries with it a high risk of complications and morbidity.  Serious etiology was considered. Ddx includes but is not limited to: Differential diagnosis includes but is not exclusive to ovarian cyst, ovarian torsion, acute appendicitis, urinary tract infection, endometriosis, bowel obstruction, hernia, colitis, renal colic, gastroenteritis, volvulus etc.   Complete initial physical exam performed, notably the patient was in no distress, HDS, abdomen nonperitoneal.    Reviewed and confirmed nursing documentation for past medical history, family history, social history.  Vital signs reviewed.     Brief summary:  63 year old female history as above here with abdominal pain nausea vomiting diarrhea, near syncope with low blood pressure Symptoms greatly improved upon arrival to ER.   Clinical Course as of 01/20/24 1420  Mon Jan 20, 2024  1207 Feeling much better, tolerating po [SG]  1342 Labs stable  [SG]  1342 Protein(!): 100 Abn, advised to f/u with pcp. Renal fxn  is stable, no sig swelling to face/extremities noted [SG]  1352 Feeling better, bp stable, tolerating po. [SG]  1411 Continues to report improvement to her symptoms, no abd pain [SG]    Clinical Course User Index [SG] Teddi Favors, DO     Labs are stable.  She has no abdominal pain.  Do not feel emergent imaging is indicated this time given resolution of her symptoms.  She is HDS, blood pressure has been  stable.  She is ambulatory to the bathroom without assistance and without hypotension She is tolerating p.o. in the ER.  She has no cp or dib, no palpitations or tachycardia, no unilateral leg swelling, Well's score is low, PE seems unlikely at this time  Patient presents with syncopal symptoms without worrisome features. Presentation most suggestive of neuro-cardiogenic or orthostatic cause. Very low suspicion for serious arrhythmia, cardiac ischemia or other serious etiology. ECG reviewed, no evidence of a cardiac arrhythmia such as Brugada, WPW, HOCM, IHSS, Long or short QT. Neurologic exam is nonfocal, not consistent with CVA or primary neurologic abnormality. Patient appears safe for discharge with outpatient observation and close PCP F/U. Syncope warnings discussed with patient. The patient has been instructed to return immediately if the symptoms worsen in any way. Patient verbalized understanding and is in agreement with current care plan. All questions answered prior to discharge.      Additional history obtained: -Additional history obtained from na -External records from outside source obtained and reviewed including: Chart review including previous notes, labs, imaging, consultation notes including  Primary care documentation Home medications   Lab Tests: -I ordered, reviewed, and interpreted labs.   The pertinent results include:   Labs Reviewed  CBC WITH DIFFERENTIAL/PLATELET - Abnormal; Notable for the following components:      Result Value   Lymphs Abs 0.4 (*)    All other components within normal limits  COMPREHENSIVE METABOLIC PANEL WITH GFR - Abnormal; Notable for the following components:   Glucose, Bld 106 (*)    Calcium 8.3 (*)    Total Protein 6.1 (*)    Albumin 3.4 (*)    All other components within normal limits  URINALYSIS, ROUTINE W REFLEX MICROSCOPIC - Abnormal; Notable for the following components:   Protein, ur 100 (*)    All other components within  normal limits  LIPASE, BLOOD    Notable for as above, stable  EKG   EKG Interpretation Date/Time:  Monday January 20 2024 10:48:12 EDT Ventricular Rate:  73 PR Interval:  155 QRS Duration:  77 QT Interval:  403 QTC Calculation: 445 R Axis:   -46  Text Interpretation: Sinus rhythm Left anterior fascicular block Low voltage, precordial leads no stemi similar to prior Confirmed by Russella Courts ([id number]) on 01/20/2024 2:10:58 PM         Imaging Studies ordered: na   Medicines ordered and prescription drug management: Meds ordered this encounter  Medications   sodium chloride  0.9 % bolus 1,000 mL    -I have reviewed the patients home medicines and have made adjustments as needed   Consultations Obtained: na   Cardiac Monitoring: The patient was maintained on a cardiac monitor.  I personally viewed and interpreted the cardiac monitored which showed an underlying rhythm of: nsr Continuous pulse oximetry interpreted by myself, 100% on ra.    Social Determinants of Health:  Diagnosis or treatment significantly limited by social determinants of health: na   Reevaluation: After the interventions noted above, I reevaluated the patient  and found that they have resolved  Co morbidities that complicate the patient evaluation  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   Alopecia    also with female pattern hair loss   Anxiety    Depression    GERD (gastroesophageal reflux disease)    Headache(784.0)    IBS (irritable bowel syndrome)    Migraines    PFO (patent foramen ovale)    Shingles    09/2022   Stroke [facility])    Uterine fibroids affecting pregnancy       Dispostion: Disposition decision including need for hospitalization was considered, and patient discharged from emergency department.    Final Clinical Impression(s) / ED Diagnoses Final diagnoses:  Near syncope  Nausea and vomiting, unspecified vomiting  type        Teddi Favors, DO 01/20/24 1420

## 2024-01-20 NOTE — Discharge Instructions (Addendum)
 It was a pleasure caring for you today in the emergency department.   Have someone stay with you until you feel stable. Do not drive, operate machinery, or play sports until your caregiver says it is okay. Keep all follow-up appointments as directed. Lie down right away if you start feeling like you might faint. Breathe deeply and steadily. Wait until all the symptoms have passed.Drink enough fluids to keep your urine clear or pale yellow. If you are taking blood pressure or heart medicine, get up slowly, taking several minutes to sit and then stand. This can reduce dizziness. SEEK IMMEDIATE MEDICAL CARE IF: You have a severe headache. You have unusual pain in the chest, abdomen, or back. You are bleeding from the mouth or rectum, or you have a black or tarry stool. You have an irregular or very fast heartbeat. You have pain with breathing. You have repeated fainting or seizure-like jerking during an episode. You faint when sitting or lying down. You have confusion. You have difficulty walking. You have severe weakness. You have vision problems. If you fainted, call your local emergency services - do not drive yourself to the hospital.   Please return to the emergency department immediately for any new or concerning symptoms, or if you get worse.

## 2024-01-20 NOTE — ED Triage Notes (Addendum)
 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

## 2024-01-20 NOTE — ED Notes (Addendum)
 Patient given cup of water, and finished cup. Then ambulated to the restroom, stated "feels fine." Second cup of water given per patient request. MD made aware.

## 2024-05-19 ENCOUNTER — Telehealth: Payer: Self-pay | Admitting: Pharmacist

## 2024-05-19 NOTE — Telephone Encounter (Signed)
 Pharmacy Patient Advocate Encounter  Received notification from [employer] that Prior Authorization for NURTEC 75 MG PO TBDP has been APPROVED from 04/19/2024 to 05/19/2025   PA #/Case ID/Reference #: [id number]

## 2024-05-19 NOTE — Telephone Encounter (Signed)
 Pharmacy Patient Advocate Encounter   Received notification from CoverMyMeds that prior authorization for Nurtec 75MG  dispersible tablets is required/requested.   Insurance verification completed.   The patient is insured through [employer].   Per test claim: PA required; PA submitted to above mentioned insurance via Latent Key/confirmation #/EOC [facility] Status is pending

## 2024-06-02 ENCOUNTER — Encounter: Payer: Self-pay | Admitting: *Deleted

## 2024-06-03 DIAGNOSIS — Z0289 Encounter for other administrative examinations: Secondary | ICD-10-CM

## 2024-06-03 NOTE — Telephone Encounter (Signed)
 FMLA renewal form is pending NP signature.

## 2024-06-04 ENCOUNTER — Telehealth: Payer: Self-pay | Admitting: *Deleted

## 2024-06-04 NOTE — Telephone Encounter (Signed)
 Pt fmla form faxed on 06/04/2024 Devere Bough [phone]

## 2024-06-04 NOTE — Telephone Encounter (Signed)
 Fmla form signed and sent to medical records for processing.

## 2024-06-08 ENCOUNTER — Encounter: Payer: Self-pay | Admitting: Adult Health

## 2024-06-08 DIAGNOSIS — F5104 Psychophysiologic insomnia: Secondary | ICD-10-CM

## 2024-06-09 MED ORDER — ZOLPIDEM TARTRATE ER 12.5 MG PO TBCR: 12.5000 mg | EXTENDED_RELEASE_TABLET | Freq: Every evening | ORAL | 5 refills | Status: AC | PRN

## 2024-06-09 NOTE — Telephone Encounter (Signed)
 Last seen 11-19-2023, next scheduled appt 11-18-2024 with Dr. Chalice. Last filled 05-13-2024 #30.

## 2024-07-22 ENCOUNTER — Other Ambulatory Visit

## 2024-08-17 ENCOUNTER — Ambulatory Visit (HOSPITAL_BASED_OUTPATIENT_CLINIC_OR_DEPARTMENT_OTHER)
Admission: RE | Admit: 2024-08-17 | Discharge: 2024-08-17 | Disposition: A | Discharge status: Home / Self Care | Source: Ambulatory Visit | Attending: Radiology | Admitting: Radiology

## 2024-08-17 DIAGNOSIS — Z780 Asymptomatic menopausal state: Secondary | ICD-10-CM | POA: Diagnosis present

## 2024-08-17 DIAGNOSIS — M8580 Other specified disorders of bone density and structure, unspecified site: Secondary | ICD-10-CM | POA: Diagnosis present

## 2024-08-18 ENCOUNTER — Ambulatory Visit: Payer: Self-pay | Admitting: Radiology

## 2024-11-18 ENCOUNTER — Ambulatory Visit: Admitting: Neurology

## 2024-11-19 ENCOUNTER — Ambulatory Visit: Admitting: Radiology
